# Patient Record
Sex: Male | Born: 1940 | Race: Black or African American | Hispanic: No | Marital: Married | Smoking: Never smoker
Health system: Southern US, Community
[De-identification: ages and names within clinical notes are randomized; demographics above are authoritative.]

## PROBLEM LIST (undated history)

## (undated) DIAGNOSIS — K5790 Diverticulosis of intestine, part unspecified, without perforation or abscess without bleeding: Secondary | ICD-10-CM

## (undated) DIAGNOSIS — K449 Diaphragmatic hernia without obstruction or gangrene: Secondary | ICD-10-CM

## (undated) DIAGNOSIS — E119 Type 2 diabetes mellitus without complications: Secondary | ICD-10-CM

## (undated) DIAGNOSIS — R413 Other amnesia: Secondary | ICD-10-CM

## (undated) DIAGNOSIS — B1920 Unspecified viral hepatitis C without hepatic coma: Secondary | ICD-10-CM

## (undated) DIAGNOSIS — C61 Malignant neoplasm of prostate: Secondary | ICD-10-CM

## (undated) DIAGNOSIS — I10 Essential (primary) hypertension: Secondary | ICD-10-CM

## (undated) HISTORY — PX: LAPAROSCOPIC CHOLECYSTECTOMY: SUR755

## (undated) HISTORY — PX: INSERTION PROSTATE RADIATION SEED: SUR718

---

## 2015-03-27 ENCOUNTER — Encounter (HOSPITAL_COMMUNITY): Payer: Self-pay | Admitting: *Deleted

## 2015-03-27 ENCOUNTER — Inpatient Hospital Stay (HOSPITAL_COMMUNITY)
Admission: EM | Admit: 2015-03-27 | Discharge: 2015-04-16 | DRG: 329 | Disposition: A | Discharge status: Skilled Nursing Facility | Payer: Medicare (Managed Care) | Attending: Internal Medicine | Admitting: Internal Medicine

## 2015-03-27 DIAGNOSIS — Z515 Encounter for palliative care: Secondary | ICD-10-CM | POA: Diagnosis not present

## 2015-03-27 DIAGNOSIS — Z833 Family history of diabetes mellitus: Secondary | ICD-10-CM | POA: Diagnosis not present

## 2015-03-27 DIAGNOSIS — K5791 Diverticulosis of intestine, part unspecified, without perforation or abscess with bleeding: Principal | ICD-10-CM | POA: Diagnosis present

## 2015-03-27 DIAGNOSIS — K921 Melena: Secondary | ICD-10-CM

## 2015-03-27 DIAGNOSIS — J690 Pneumonitis due to inhalation of food and vomit: Secondary | ICD-10-CM

## 2015-03-27 DIAGNOSIS — R1314 Dysphagia, pharyngoesophageal phase: Secondary | ICD-10-CM

## 2015-03-27 DIAGNOSIS — R579 Shock, unspecified: Secondary | ICD-10-CM | POA: Diagnosis present

## 2015-03-27 DIAGNOSIS — D62 Acute posthemorrhagic anemia: Secondary | ICD-10-CM | POA: Diagnosis present

## 2015-03-27 DIAGNOSIS — K5792 Diverticulitis of intestine, part unspecified, without perforation or abscess without bleeding: Secondary | ICD-10-CM | POA: Diagnosis present

## 2015-03-27 DIAGNOSIS — Z8042 Family history of malignant neoplasm of prostate: Secondary | ICD-10-CM

## 2015-03-27 DIAGNOSIS — R1312 Dysphagia, oropharyngeal phase: Secondary | ICD-10-CM | POA: Diagnosis present

## 2015-03-27 DIAGNOSIS — E872 Acidosis: Secondary | ICD-10-CM

## 2015-03-27 DIAGNOSIS — J9602 Acute respiratory failure with hypercapnia: Secondary | ICD-10-CM

## 2015-03-27 DIAGNOSIS — R0989 Other specified symptoms and signs involving the circulatory and respiratory systems: Secondary | ICD-10-CM

## 2015-03-27 DIAGNOSIS — K449 Diaphragmatic hernia without obstruction or gangrene: Secondary | ICD-10-CM | POA: Diagnosis present

## 2015-03-27 DIAGNOSIS — Z781 Physical restraint status: Secondary | ICD-10-CM | POA: Diagnosis not present

## 2015-03-27 DIAGNOSIS — K922 Gastrointestinal hemorrhage, unspecified: Secondary | ICD-10-CM | POA: Diagnosis present

## 2015-03-27 DIAGNOSIS — E876 Hypokalemia: Secondary | ICD-10-CM | POA: Diagnosis not present

## 2015-03-27 DIAGNOSIS — F0390 Unspecified dementia without behavioral disturbance: Secondary | ICD-10-CM | POA: Diagnosis present

## 2015-03-27 DIAGNOSIS — Z8546 Personal history of malignant neoplasm of prostate: Secondary | ICD-10-CM

## 2015-03-27 DIAGNOSIS — Z01818 Encounter for other preprocedural examination: Secondary | ICD-10-CM

## 2015-03-27 DIAGNOSIS — G9341 Metabolic encephalopathy: Secondary | ICD-10-CM | POA: Diagnosis present

## 2015-03-27 DIAGNOSIS — Z0189 Encounter for other specified special examinations: Secondary | ICD-10-CM

## 2015-03-27 DIAGNOSIS — Z978 Presence of other specified devices: Secondary | ICD-10-CM

## 2015-03-27 DIAGNOSIS — Z8249 Family history of ischemic heart disease and other diseases of the circulatory system: Secondary | ICD-10-CM

## 2015-03-27 DIAGNOSIS — F05 Delirium due to known physiological condition: Secondary | ICD-10-CM | POA: Diagnosis not present

## 2015-03-27 DIAGNOSIS — Z09 Encounter for follow-up examination after completed treatment for conditions other than malignant neoplasm: Secondary | ICD-10-CM

## 2015-03-27 DIAGNOSIS — R630 Anorexia: Secondary | ICD-10-CM | POA: Diagnosis present

## 2015-03-27 DIAGNOSIS — I469 Cardiac arrest, cause unspecified: Secondary | ICD-10-CM

## 2015-03-27 DIAGNOSIS — E1165 Type 2 diabetes mellitus with hyperglycemia: Secondary | ICD-10-CM | POA: Diagnosis present

## 2015-03-27 DIAGNOSIS — F0391 Unspecified dementia with behavioral disturbance: Secondary | ICD-10-CM | POA: Diagnosis not present

## 2015-03-27 DIAGNOSIS — E119 Type 2 diabetes mellitus without complications: Secondary | ICD-10-CM

## 2015-03-27 DIAGNOSIS — Z8719 Personal history of other diseases of the digestive system: Secondary | ICD-10-CM | POA: Diagnosis not present

## 2015-03-27 DIAGNOSIS — I10 Essential (primary) hypertension: Secondary | ICD-10-CM | POA: Diagnosis present

## 2015-03-27 DIAGNOSIS — Z4659 Encounter for fitting and adjustment of other gastrointestinal appliance and device: Secondary | ICD-10-CM

## 2015-03-27 DIAGNOSIS — G9340 Encephalopathy, unspecified: Secondary | ICD-10-CM | POA: Diagnosis not present

## 2015-03-27 DIAGNOSIS — Z9289 Personal history of other medical treatment: Secondary | ICD-10-CM

## 2015-03-27 DIAGNOSIS — J9601 Acute respiratory failure with hypoxia: Secondary | ICD-10-CM | POA: Diagnosis not present

## 2015-03-27 DIAGNOSIS — B1920 Unspecified viral hepatitis C without hepatic coma: Secondary | ICD-10-CM | POA: Diagnosis present

## 2015-03-27 DIAGNOSIS — N179 Acute kidney failure, unspecified: Secondary | ICD-10-CM | POA: Diagnosis present

## 2015-03-27 DIAGNOSIS — N170 Acute kidney failure with tubular necrosis: Secondary | ICD-10-CM | POA: Diagnosis not present

## 2015-03-27 DIAGNOSIS — Z452 Encounter for adjustment and management of vascular access device: Secondary | ICD-10-CM

## 2015-03-27 DIAGNOSIS — T17908A Unspecified foreign body in respiratory tract, part unspecified causing other injury, initial encounter: Secondary | ICD-10-CM

## 2015-03-27 HISTORY — DX: Unspecified viral hepatitis C without hepatic coma: B19.20

## 2015-03-27 HISTORY — DX: Malignant neoplasm of prostate: C61

## 2015-03-27 HISTORY — DX: Essential (primary) hypertension: I10

## 2015-03-27 HISTORY — DX: Type 2 diabetes mellitus without complications: E11.9

## 2015-03-27 HISTORY — DX: Diaphragmatic hernia without obstruction or gangrene: K44.9

## 2015-03-27 HISTORY — DX: Diverticulosis of intestine, part unspecified, without perforation or abscess without bleeding: K57.90

## 2015-03-27 HISTORY — DX: Other amnesia: R41.3

## 2015-03-27 LAB — CBC
HEMATOCRIT: 28.8 % — AB (ref 39.0–52.0)
Hemoglobin: 9.7 g/dL — ABNORMAL LOW (ref 13.0–17.0)
MCH: 31.7 pg (ref 26.0–34.0)
MCHC: 33.7 g/dL (ref 30.0–36.0)
MCV: 94.1 fL (ref 78.0–100.0)
PLATELETS: 205 10*3/uL (ref 150–400)
RBC: 3.06 MIL/uL — ABNORMAL LOW (ref 4.22–5.81)
RDW: 13.6 % (ref 11.5–15.5)
WBC: 11.3 10*3/uL — AB (ref 4.0–10.5)

## 2015-03-27 LAB — PROTIME-INR
INR: 1.13 (ref 0.00–1.49)
Prothrombin Time: 14.7 seconds (ref 11.6–15.2)

## 2015-03-27 LAB — COMPREHENSIVE METABOLIC PANEL
ALBUMIN: 3.4 g/dL — AB (ref 3.5–5.0)
ALT: 32 U/L (ref 17–63)
AST: 29 U/L (ref 15–41)
Alkaline Phosphatase: 51 U/L (ref 38–126)
Anion gap: 7 (ref 5–15)
BILIRUBIN TOTAL: 0.7 mg/dL (ref 0.3–1.2)
BUN: 31 mg/dL — AB (ref 6–20)
CO2: 22 mmol/L (ref 22–32)
CREATININE: 1.54 mg/dL — AB (ref 0.61–1.24)
Calcium: 9.3 mg/dL (ref 8.9–10.3)
Chloride: 110 mmol/L (ref 101–111)
GFR calc Af Amer: 50 mL/min — ABNORMAL LOW (ref >60)
GFR, EST NON AFRICAN AMERICAN: 43 mL/min — AB (ref >60)
GLUCOSE: 143 mg/dL — AB (ref 65–99)
Potassium: 4 mmol/L (ref 3.5–5.1)
Sodium: 139 mmol/L (ref 135–145)
TOTAL PROTEIN: 6.3 g/dL — AB (ref 6.5–8.1)

## 2015-03-27 LAB — CBG MONITORING, ED: GLUCOSE-CAPILLARY: 85 mg/dL (ref 65–99)

## 2015-03-27 LAB — APTT: APTT: 21 s — AB (ref 24–37)

## 2015-03-27 LAB — GLUCOSE, CAPILLARY
GLUCOSE-CAPILLARY: 98 mg/dL (ref 65–99)
GLUCOSE-CAPILLARY: 105 mg/dL — AB (ref 65–99)

## 2015-03-27 LAB — PREPARE RBC (CROSSMATCH)

## 2015-03-27 LAB — MRSA PCR SCREENING: MRSA BY PCR: NEGATIVE

## 2015-03-27 LAB — ABO/RH: ABO/RH(D): O POS

## 2015-03-27 MED ORDER — SODIUM CHLORIDE 0.9 % IV SOLN: Freq: Once | INTRAVENOUS | Status: DC

## 2015-03-27 MED ORDER — SODIUM CHLORIDE 0.9 % IV BOLUS (SEPSIS)
2000.0000 mL | Freq: Once | INTRAVENOUS | Status: AC
  Administered 2015-03-27: 2000 mL via INTRAVENOUS

## 2015-03-27 MED ORDER — INSULIN ASPART 100 UNIT/ML ~~LOC~~ SOLN
0.0000 [IU] | SUBCUTANEOUS | Status: DC
  Administered 2015-03-28 – 2015-03-29 (×2): 1 [IU] via SUBCUTANEOUS
  Administered 2015-03-29: 2 [IU] via SUBCUTANEOUS
  Administered 2015-03-29: 1 [IU] via SUBCUTANEOUS
  Administered 2015-03-29: 2 [IU] via SUBCUTANEOUS
  Administered 2015-03-30 (×4): 1 [IU] via SUBCUTANEOUS
  Administered 2015-03-30: 2 [IU] via SUBCUTANEOUS
  Administered 2015-03-31 – 2015-04-08 (×20): 1 [IU] via SUBCUTANEOUS
  Administered 2015-04-09: 2 [IU] via SUBCUTANEOUS
  Administered 2015-04-09 – 2015-04-16 (×11): 1 [IU] via SUBCUTANEOUS

## 2015-03-27 MED ORDER — SODIUM CHLORIDE 0.9 % IJ SOLN
3.0000 mL | Freq: Two times a day (BID) | INTRAMUSCULAR | Status: DC
  Administered 2015-03-28 – 2015-04-15 (×26): 3 mL via INTRAVENOUS

## 2015-03-27 MED ORDER — HALOPERIDOL LACTATE 5 MG/ML IJ SOLN
1.0000 mg | Freq: Once | INTRAMUSCULAR | Status: AC
  Administered 2015-03-27: 1 mg via INTRAVENOUS
  Filled 2015-03-27: qty 1

## 2015-03-27 MED ORDER — SODIUM CHLORIDE 0.9 % IV SOLN
INTRAVENOUS | Status: DC
  Administered 2015-03-27 – 2015-03-29 (×4): via INTRAVENOUS

## 2015-03-27 MED ORDER — SODIUM CHLORIDE 0.9 % IV SOLN
80.0000 mg | Freq: Once | INTRAVENOUS | Status: AC
  Administered 2015-03-27: 80 mg via INTRAVENOUS
  Filled 2015-03-27: qty 80

## 2015-03-27 MED ORDER — SODIUM CHLORIDE 0.9 % IV SOLN
8.0000 mg/h | INTRAVENOUS | Status: DC
  Administered 2015-03-27 – 2015-03-29 (×3): 8 mg/h via INTRAVENOUS
  Filled 2015-03-27 (×9): qty 80

## 2015-03-27 MED ORDER — ACETAMINOPHEN 325 MG PO TABS: 650.0000 mg | ORAL_TABLET | Freq: Four times a day (QID) | ORAL | Status: DC | PRN

## 2015-03-27 MED ORDER — ONDANSETRON HCL 4 MG/2ML IJ SOLN
4.0000 mg | Freq: Four times a day (QID) | INTRAMUSCULAR | Status: DC | PRN
  Filled 2015-03-27: qty 2

## 2015-03-27 MED ORDER — ACETAMINOPHEN 650 MG RE SUPP
650.0000 mg | Freq: Four times a day (QID) | RECTAL | Status: DC | PRN
  Administered 2015-04-01 – 2015-04-07 (×2): 650 mg via RECTAL
  Filled 2015-03-27 (×2): qty 1

## 2015-03-27 MED ORDER — ONDANSETRON HCL 4 MG PO TABS: 4.0000 mg | ORAL_TABLET | Freq: Four times a day (QID) | ORAL | Status: DC | PRN

## 2015-03-27 MED ORDER — INFLUENZA VAC SPLIT QUAD 0.5 ML IM SUSY
0.5000 mL | PREFILLED_SYRINGE | INTRAMUSCULAR | Status: DC
  Filled 2015-03-27 (×2): qty 0.5

## 2015-03-27 NOTE — ED Provider Notes (Signed)
CSN: [id number]     Arrival date & time 03/27/15  1328 History   First MD Initiated Contact with Patient 03/27/15 1343     Chief Complaint  Patient presents with  . Blood In Stools  . Weakness     (Consider location/radiation/quality/duration/timing/severity/associated sxs/prior Treatment) HPI Comments: Patient here with bloody stools 3 days. Stool is characterized as bright red blood and does have a history of diverticulitis. No abdominal pain. No vomiting noted. No fever or chills. Has had increasing weakness without syncope or near-syncope. No palpitations appreciated. No prior history of colonic polyps. Symptoms persistent and nothing makes them better or worse. No treatment use prior to arrival.  Patient is a 74 y.o. male presenting with weakness. The history is provided by the patient.  Weakness    Past Medical History  Diagnosis Date  . Memory loss   . Hypertension   . Diabetes mellitus without complication   . Diverticulitis   . Hepatitis C    Past Surgical History  Procedure Laterality Date  . Cholecystectomy     No family history on file. Social History  Substance Use Topics  . Smoking status: Never Smoker   . Smokeless tobacco: None  . Alcohol Use: Yes     Comment: occ    Review of Systems  Neurological: Positive for weakness.  All other systems reviewed and are negative.     Allergies  Review of patient's allergies indicates no known allergies.  Home Medications   Prior to Admission medications   Not on File   BP 98/62 mmHg  Pulse 120  Temp(Src) 97.8 F (36.6 C) (Oral)  Resp 16  Ht 5\' 11"  (1.803 m)  Wt 217 lb (98.431 kg)  BMI 30.28 kg/m2  SpO2 99% Physical Exam  Constitutional: He is oriented to person, place, and time. He appears well-developed and well-nourished.  Non-toxic appearance. No distress.  HENT:  Head: Normocephalic and atraumatic.  Eyes: Conjunctivae, EOM and lids are normal. Pupils are equal, round, and reactive to light.   Neck: Normal range of motion. Neck supple. No tracheal deviation present. No thyroid mass present.  Cardiovascular: Regular rhythm and normal heart sounds.  Tachycardia present.  Exam reveals no gallop.   No murmur heard. Pulmonary/Chest: Effort normal and breath sounds normal. No stridor. No respiratory distress. He has no decreased breath sounds. He has no wheezes. He has no rhonchi. He has no rales.  Abdominal: Soft. Normal appearance and bowel sounds are normal. He exhibits no distension. There is no tenderness. There is no rebound and no CVA tenderness.  Genitourinary:  Gross blood present  Musculoskeletal: Normal range of motion. He exhibits no edema or tenderness.  Neurological: He is alert and oriented to person, place, and time. He has normal strength. No cranial nerve deficit or sensory deficit. GCS eye subscore is 4. GCS verbal subscore is 5. GCS motor subscore is 6.  Skin: Skin is warm and dry. No abrasion and no rash noted.  Psychiatric: He has a normal mood and affect. His speech is normal and behavior is normal.  Nursing note and vitals reviewed.   ED Course  Procedures (including critical care time) Labs Review Labs Reviewed  COMPREHENSIVE METABOLIC PANEL  CBC  PROTIME-INR  APTT  POC OCCULT BLOOD, ED  TYPE AND SCREEN    Imaging Review No results found. I have personally reviewed and evaluated these images and lab results as part of my medical decision-making.   EKG Interpretation   Date/Time:  Thursday  March 27 2015 13:33:49 EDT Ventricular Rate:  126 PR Interval:  146 QRS Duration: 94 QT Interval:  318 QTC Calculation: 460 R Axis:   -26 Text Interpretation:  Sinus tachycardia with Premature atrial complexes  Incomplete right bundle branch block Possible Anterior infarct , age  undetermined Abnormal ECG Confirmed by Reiana Poteet  MD, Lizandra Zakrzewski ([id number]) on  03/27/2015 1:43:22 PM      MDM   Final diagnoses:  None   patient with hypotension here and given  fluid resuscitation. 2 L of packed red blood cells ordered. Patient mentating appropriately here. Will consult GI as well as hospitalist for admission to ICU   CRITICAL CARE Performed by: Leota Jacobsen Total critical care time: 50 Critical care time was exclusive of separately billable procedures and treating other patients. Critical care was necessary to treat or prevent imminent or life-threatening deterioration. Critical care was time spent personally by me on the following activities: development of treatment plan with patient and/or surrogate as well as nursing, discussions with consultants, evaluation of patient's response to treatment, examination of patient, obtaining history from patient or surrogate, ordering and performing treatments and interventions, ordering and review of laboratory studies, ordering and review of radiographic studies, pulse oximetry and re-evaluation of patient's condition.     Lacretia Leigh, MD 03/27/15 1455

## 2015-03-27 NOTE — ED Notes (Signed)
Pt CBG, 85. Nurse was notified.

## 2015-03-27 NOTE — H&P (Signed)
[facility] History and Physical                                                                                    Jesse [name], is a 74 y.o. male  MRN: [id number]   DOB - [birth year]  Admit Date - 03/27/2015  Outpatient Primary MD for the patient is No primary care provider on file.  Referring MD: Zenia Resides / ER  Consulting M.D: Fuller Plan / Gastroenterology  With History of -  Past Medical History  Diagnosis Date  . Memory loss   . Hypertension   . Diabetes mellitus without complication   . Diverticulitis   . Hepatitis C       Past Surgical History  Procedure Laterality Date  . Cholecystectomy      in for   Chief Complaint  Patient presents with  . Blood In Stools  . Weakness     HPI This is a 74 year old male patient visiting from [place] with this wife who has a past medical history of hypertension, diabetes mellitus not on insulin, dementia, diverticulitis, had a hernia and prostate cancer. He presented to the ER with reports of bloody stools for 3 days. He has not had any abdominal pain with these symptoms. He did report on Tuesday evening prior to the onset of the first bloody stool he was having epigastric discomfort and severe indigestion. He has not been taking any NSAIDs. His last colonoscopy was greater than 10 years ago and the wife reports he is due in 2016. Patient has not had any shortness of breath, chest pain or palpitations. The wife has noted the patient to be more weak and the patient has also reported weakness and fatigue. He has not had any constitutional symptoms such as fevers chills and has not had any sick contacts. No emesis.  When the patient presented to triage his blood pressure was 98/62 with a pulse rate of 120 and he was afebrile. His blood pressure did decrease to as low as 78/55 and he was subsequently given 2 L IV fluid by the ER physician with follow-up blood pressure up to 97/65. Initial hemoglobin 9.7 with baseline unknown but given  presentation with symptomatic anemia as evidenced by hypotension it is suspected this is not patient's baseline and 2 units of packed red cells up in order by the EDP. BUN was noted to be 31 with a creatinine of 1.5 for again baseline unknown, glucose 143, white count slightly elevated at 11,300, platelet counts normal 205,000, PT 14.7 and INR 1.13, PTT 21. EKG unremarkable with sinus tachycardia rate 126 bpm, QTC 460 ms, no ST segment changes or T-wave inversions that would be concerning for ischemia. On-call gastroenterology has been consulted by the EDP.   Review of Systems   In addition to the HPI above,  No Fever-chills, myalgias or other constitutional symptoms No Headache, changes with Vision or hearing, new weakness, tingling, numbness in any extremity, No problems swallowing food or Liquids, indigestion/reflux No Chest pain, Cough or Shortness of Breath, palpitations, orthopnea or DOE, No dysuria, hematuria or flank pain No new skin rashes, lesions, masses or bruises, No new joints pains-aches No  recent weight gain or loss No polyuria, polydypsia or polyphagia,  *A full 10 point Review of Systems was done, except as stated above, all other Review of Systems were negative.  Social History Social History  Substance Use Topics  . Smoking status: Never Smoker   . Smokeless tobacco: Not on file  . Alcohol Use: Yes     Comment: occ    Resides at: Private residence in [place] state  Lives with: Wife  Ambulatory status: Without assistive devices   Family History No family history on file. Limited family history obtained but wife reports there is a history of colon cancer within the patient's family   Prior to Admission medications   Medication Sig Start Date End Date Taking? Authorizing Provider  lisinopril (PRINIVIL,ZESTRIL) 20 MG tablet Take 20 mg by mouth daily.   Yes Historical Provider, MD  metFORMIN (GLUCOPHAGE) 500 MG tablet Take 500 mg by mouth 2 (two) times daily  with a meal.   Yes Historical Provider, MD    No Known Allergies  Physical Exam  Vitals  Blood pressure 97/65, pulse 97, temperature 97.8 F (36.6 C), temperature source Oral, resp. rate 13, height 5\' 11"  (1.803 m), weight 217 lb (98.431 kg), SpO2 100 %.   General:  In no acute distress, appears healthy and well nourished  Psych:  Normal affect, pleasantly confused and states "I do not know where I am at", wife has been assisting with redirection of the patient, no agitation  Neuro:   No focal neurological deficits, CN II through XII intact, Strength 5/5 all 4 extremities, Sensation intact all 4 extremities.  ENT:  Ears and Eyes appear Normal, Conjunctivae clear, PER. Moist oral mucosa without erythema or exudates.  Neck:  Supple, No lymphadenopathy appreciated  Respiratory:  Symmetrical chest wall movement, Good air movement bilaterally, CTAB. Room Air  Cardiac:  RRR, No Murmurs, no LE edema noted, no JVD, No carotid bruits, peripheral pulses palpable at 2+  Abdomen:  Positive bowel hypoactive bowel sounds, Soft, Non tender, distended somewhat more so than reported baseline according to wife,  No masses appreciated, no obvious hepatosplenomegaly  Skin:  No Cyanosis, Normal Skin Turgor, No Skin Rash or Bruise.  Extremities: Symmetrical without obvious trauma or injury,  no effusions.  Data Review  CBC  Recent Labs Lab 03/27/15 1400  WBC 11.3*  HGB 9.7*  HCT 28.8*  PLT 205  MCV 94.1  MCH 31.7  MCHC 33.7  RDW 13.6    Chemistries   Recent Labs Lab 03/27/15 1400  NA 139  K 4.0  CL 110  CO2 22  GLUCOSE 143*  BUN 31*  CREATININE 1.54*  CALCIUM 9.3  AST 29  ALT 32  ALKPHOS 51  BILITOT 0.7    estimated creatinine clearance is 51.8 mL/min (by C-G formula based on Cr of 1.54).  No results for input(s): TSH, T4TOTAL, T3FREE, THYROIDAB in the last 72 hours.  Invalid input(s): FREET3  Coagulation profile  Recent Labs Lab 03/27/15 1400  INR 1.13     No results for input(s): DDIMER in the last 72 hours.  Cardiac Enzymes No results for input(s): CKMB, TROPONINI, MYOGLOBIN in the last 168 hours.  Invalid input(s): CK  Invalid input(s): POCBNP  Urinalysis No results found for: COLORURINE, APPEARANCEUR, LABSPEC, [place], GLUCOSEU, HGBUR, BILIRUBINUR, KETONESUR, PROTEINUR, UROBILINOGEN, NITRITE, LEUKOCYTESUR  Imaging results:   No results found.   EKG: (Independently reviewed) sinus tachycardia, ventricular rate 126 bpm, QTC 460 ms, low R wave voltage V2  V3, R-R prime pattern suggestive of incomplete right bundle branch block, no ST segment or T-wave changes concerning for acute ischemia   Assessment & Plan  Principal Problem:   Acute GI bleeding/ Hiatal hernia/ History of diverticulitis -Admit to stepdown -GI consulted -Is having bright red blood per rectum at least 2 stools per day for the past 3 days but currently not actively bleeding-no abdominal pain -BUN elevated at 31 with baseline unknown therefore possibly could be experiencing upper GI bleed -As precaution will begin Protonix infusion in the event this is upper GI in etiology -NPO -IVFs  Active Problems:   Acute blood loss anemia -Hemoglobin 9.7 at presentation with baseline hemoglobin unknown -Patient clearly symptomatic upon arrival with hypotension; has received 2 L IV fluids in the ER and 2 units PRBCs pending -Repeat CBC after transfusion and again in a.m. -Have requested medical records from 2 hospitals in [place]    HTN  -Hold lisinopril in setting of symptomatic anemia as evidenced by hypotension as well as suspected acute renal failure    Diabetes mellitus type 2, controlled -On metformin home; currently on hold in setting of acute illness and suspected acute renal failure    Acute renal failure -Suspect prerenal etiology secondary to hypotension and find depletion from acute blood loss anemia -Hold lisinopril -Continue IV fluids -Repeat  electrolyte panel in a.m.    Dementia -Stable at baseline -Anticipate may worsen in setting of acute hospitalization and acute illness    History of prostate cancer -No reported issues with voiding    DVT Prophylaxis: SCDs  Family Communication:  Wife at bedside   Code Status:  Full code  Condition:  Guarded  Discharge disposition: Anticipate discharge back to home with wife pending resolution of GI bleed symptoms; if has recurrent bleeding and/or protracted hospital stay may require PT/OT before discharge disposition can be determined  Time spent in minutes : 60      ELLIS,ALLISON L. ANP on 03/27/2015 at 3:35 PM  Between 7am to 7pm - Pager - [phone]  After 7pm go to [email or web address] - password [email or web address]  And look for the night coverage person covering me after hours  [facility]  Addendum  I personally evaluated patient on 03/27/2015 and agree with above findings. Jesse [name] is a pleasant 74 year old gentleman with a history of hypertension and diabetes mellitus who presented to the emergency department with complaints of bright red blood per rectum over the past 3 days. He denies associated abdominal pain, nausea, vomiting, hematemesis. On presentation he was found to have a hemoglobin of 9.7 with hematocrit of 28.8. On exam he is mentating well, awake alert oriented, no acute distress. Had benign abdominal exam. Plan to monitor patient closely overnight in the step down unit. He has been typed and screened. GI consult has been placed.

## 2015-03-27 NOTE — ED Notes (Signed)
Attempted report 

## 2015-03-27 NOTE — ED Notes (Signed)
Wife states bloody stools since Tuesday.  States weakness since Wed, stating pt has difficulty standing for long periods.

## 2015-03-28 ENCOUNTER — Encounter (HOSPITAL_COMMUNITY): Payer: Self-pay | Admitting: Physician Assistant

## 2015-03-28 DIAGNOSIS — K922 Gastrointestinal hemorrhage, unspecified: Secondary | ICD-10-CM

## 2015-03-28 DIAGNOSIS — Z8719 Personal history of other diseases of the digestive system: Secondary | ICD-10-CM

## 2015-03-28 DIAGNOSIS — D62 Acute posthemorrhagic anemia: Secondary | ICD-10-CM

## 2015-03-28 LAB — CBC
HEMATOCRIT: 21.5 % — AB (ref 39.0–52.0)
HEMATOCRIT: 24.4 % — AB (ref 39.0–52.0)
HEMOGLOBIN: 7.1 g/dL — AB (ref 13.0–17.0)
Hemoglobin: 8.2 g/dL — ABNORMAL LOW (ref 13.0–17.0)
MCH: 30 pg (ref 26.0–34.0)
MCH: 30.4 pg (ref 26.0–34.0)
MCHC: 33 g/dL (ref 30.0–36.0)
MCHC: 33.6 g/dL (ref 30.0–36.0)
MCV: 90.7 fL (ref 78.0–100.0)
MCV: 90.4 fL (ref 78.0–100.0)
PLATELETS: 152 10*3/uL (ref 150–400)
Platelets: 174 10*3/uL (ref 150–400)
RBC: 2.37 MIL/uL — AB (ref 4.22–5.81)
RBC: 2.7 MIL/uL — AB (ref 4.22–5.81)
RDW: 16 % — ABNORMAL HIGH (ref 11.5–15.5)
RDW: 15.4 % (ref 11.5–15.5)
WBC: 10.7 10*3/uL — AB (ref 4.0–10.5)
WBC: 9.6 10*3/uL (ref 4.0–10.5)

## 2015-03-28 LAB — COMPREHENSIVE METABOLIC PANEL
ALBUMIN: 2.3 g/dL — AB (ref 3.5–5.0)
ALT: 21 U/L (ref 17–63)
AST: 22 U/L (ref 15–41)
Alkaline Phosphatase: 34 U/L — ABNORMAL LOW (ref 38–126)
Anion gap: 3 — ABNORMAL LOW (ref 5–15)
BILIRUBIN TOTAL: 0.9 mg/dL (ref 0.3–1.2)
BUN: 16 mg/dL (ref 6–20)
CHLORIDE: 117 mmol/L — AB (ref 101–111)
CO2: 21 mmol/L — ABNORMAL LOW (ref 22–32)
Calcium: 7.8 mg/dL — ABNORMAL LOW (ref 8.9–10.3)
Creatinine, Ser: 1.09 mg/dL (ref 0.61–1.24)
GFR calc Af Amer: >60 mL/min (ref >60)
GFR calc non Af Amer: >60 mL/min (ref >60)
GLUCOSE: 116 mg/dL — AB (ref 65–99)
POTASSIUM: 4.2 mmol/L (ref 3.5–5.1)
Sodium: 141 mmol/L (ref 135–145)
Total Protein: 4.4 g/dL — ABNORMAL LOW (ref 6.5–8.1)

## 2015-03-28 LAB — GLUCOSE, CAPILLARY
GLUCOSE-CAPILLARY: 103 mg/dL — AB (ref 65–99)
GLUCOSE-CAPILLARY: 116 mg/dL — AB (ref 65–99)
Glucose-Capillary: 120 mg/dL — ABNORMAL HIGH (ref 65–99)
Glucose-Capillary: 115 mg/dL — ABNORMAL HIGH (ref 65–99)
Glucose-Capillary: 107 mg/dL — ABNORMAL HIGH (ref 65–99)

## 2015-03-28 LAB — PREPARE RBC (CROSSMATCH)

## 2015-03-28 LAB — TSH: TSH: 0.43 u[IU]/mL (ref 0.350–4.500)

## 2015-03-28 LAB — HEMOGLOBIN AND HEMATOCRIT, BLOOD
HCT: 28.6 % — ABNORMAL LOW (ref 39.0–52.0)
Hemoglobin: 9.6 g/dL — ABNORMAL LOW (ref 13.0–17.0)

## 2015-03-28 LAB — HEMOGLOBIN A1C
Hgb A1c MFr Bld: 6.1 % — ABNORMAL HIGH (ref 4.8–5.6)
MEAN PLASMA GLUCOSE: 128 mg/dL

## 2015-03-28 MED ORDER — SODIUM CHLORIDE 0.9 % IV SOLN: Freq: Once | INTRAVENOUS | Status: AC

## 2015-03-28 MED ORDER — LORAZEPAM 2 MG/ML IJ SOLN
2.0000 mg | Freq: Once | INTRAMUSCULAR | Status: AC
  Administered 2015-03-28: 2 mg via INTRAVENOUS

## 2015-03-28 MED ORDER — POLYETHYLENE GLYCOL 3350 17 GM/SCOOP PO POWD
1.0000 | Freq: Once | ORAL | Status: DC
  Filled 2015-03-28: qty 255

## 2015-03-28 MED ORDER — HALOPERIDOL LACTATE 5 MG/ML IJ SOLN
5.0000 mg | Freq: Four times a day (QID) | INTRAMUSCULAR | Status: DC | PRN
  Administered 2015-03-28: 5 mg via INTRAVENOUS
  Administered 2015-04-03: 1 mg via INTRAVENOUS
  Filled 2015-03-28 (×3): qty 1

## 2015-03-28 MED ORDER — LORAZEPAM 2 MG/ML IJ SOLN
4.0000 mg | INTRAMUSCULAR | Status: DC | PRN
  Administered 2015-03-28: 4 mg via INTRAVENOUS
  Filled 2015-03-28: qty 2

## 2015-03-28 MED ORDER — LORAZEPAM 2 MG/ML IJ SOLN
INTRAMUSCULAR | Status: AC
  Administered 2015-03-28: 2 mg via INTRAVENOUS
  Filled 2015-03-28: qty 1

## 2015-03-28 MED ORDER — HALOPERIDOL LACTATE 5 MG/ML IJ SOLN
5.0000 mg | Freq: Once | INTRAMUSCULAR | Status: AC
  Administered 2015-03-28: 5 mg via INTRAVENOUS

## 2015-03-28 MED ORDER — SODIUM CHLORIDE 0.9 % IV SOLN
Freq: Once | INTRAVENOUS | Status: AC
  Administered 2015-03-28: 02:00:00 via INTRAVENOUS

## 2015-03-28 MED ORDER — LORAZEPAM 2 MG/ML IJ SOLN
1.0000 mg | INTRAMUSCULAR | Status: DC | PRN
  Filled 2015-03-28: qty 1

## 2015-03-28 MED ORDER — LORAZEPAM 2 MG/ML IJ SOLN
INTRAMUSCULAR | Status: AC
  Administered 2015-03-28: 1 mg via INTRAVENOUS
  Filled 2015-03-28: qty 1

## 2015-03-28 MED ORDER — HALOPERIDOL LACTATE 5 MG/ML IJ SOLN
5.0000 mg | Freq: Once | INTRAMUSCULAR | Status: AC
  Administered 2015-03-28: 5 mg via INTRAVENOUS
  Filled 2015-03-28: qty 1

## 2015-03-28 MED ORDER — HALOPERIDOL LACTATE 5 MG/ML IJ SOLN
INTRAMUSCULAR | Status: AC
  Administered 2015-03-28: 5 mg via INTRAVENOUS
  Filled 2015-03-28: qty 1

## 2015-03-28 MED ORDER — LORAZEPAM 2 MG/ML IJ SOLN
1.0000 mg | Freq: Once | INTRAMUSCULAR | Status: AC
  Administered 2015-03-28: 1 mg via INTRAVENOUS

## 2015-03-28 NOTE — Progress Notes (Signed)
Pt having large bloody stools at this time; pt lethargic; [facility] night coverage notified; BP 75/56, 1L emergency bolus initiated; orders received for 2 more units PRBCs; CBC drawn and sent; will administer blood and continue to monitor.

## 2015-03-28 NOTE — Progress Notes (Signed)
Patient became agitated and aggressive with wife and staff. Patient unable to be redirected, not listening to wife or staff. Became physically aggressive and reaching out for staff. Pulled at IV and caused tubing to become undone but IV site intact. Dr. Carles Collet contacted, new orders given will  Continue to monitor patient.

## 2015-03-28 NOTE — Progress Notes (Signed)
Patients wife had left for home. Few minutes later, patient became aggressive physically and verbally. Multiple nurses to bedside to assist with patient. Patient attempting to strike at staff. Patient was unable to be redirected. Dr. Carles Collet called. Dr. Carles Collet came to bedside to assess patient. Patient placed in restraints for safety. New orders given. Will continue to monitor patient.

## 2015-03-28 NOTE — Progress Notes (Signed)
Utilization review completed. Kaziah Krizek, RN, BSN. 

## 2015-03-28 NOTE — Progress Notes (Signed)
Patient became aggressive verbally and physically. Trying to climb out of bed, cussing at staff and wife. Patient unable to be calmed or redirected. Patient aggressive with staff, pulling at lines, security was contacted for assistance. Dr. Carles Collet was called, new orders received. Will continue to monitor patient.

## 2015-03-28 NOTE — Progress Notes (Signed)
PROGRESS NOTE  Jesse [name] TOI:[id number] DOB: 27-Dec-1940 DOA: 03/27/2015 PCP: No primary care provider on file.  Brief history 74 year old male with a history of hypertension, diabetes mellitus, dementia, diverticulitis, and prostate cancer presented to emergency department with 3 days of hematochezia. The patient and his wife are visiting from [place]. His wife states that the patient had a colonoscopy approximately 5 years ago and only showed diverticulosis. Patient was noted to be hypotensive with a blood pressure 78/55 in Emergency department which improved with fluid resuscitation. The patient received 2 units PRBCs. Patient denies any use of NSAIDs Assessment/Plan: Lower GI bleed -Suspect diverticular bleed -Consulted gastroenterology -Patient was started on Protonix drip in the emergency department--defer further management to gastroenterology -No bowel movement since admission Acute blood loss anemia -Already received 2 units PRBC -Transfuse 2 additional units -Drop in hemoglobin likely dilutional from IV fluids -Unclear what the patient's baseline hemoglobin is -INR 1.13, PTT 21 Renal failure -Unclear renal baseline, but suspect underlying CKD -Monitor electrolytes and renal function -Serum creatinine 1.54 on day of admission -Hold lisinopril Hypertension -Discontinue lisinopril in the setting of hypotension Diabetes mellitus type 2 -Discontinue metformin -NovoLog sliding scale Dementia -Anticipate periods of delirium -Haldol when necessary agitation  Family Communication:   Wife updated at beside Disposition Plan:   Home when medically stable     Procedures/Studies: No results found.      Subjective: Patient had episodes of agitation overnight. Presently denies any dizziness, chest discomfort, short of breath, nausea, vomiting, diarrhea, vomiting, dysuria, hematuria.  Objective: Filed Vitals:   03/28/15 0645 03/28/15 0700 03/28/15 0730 03/28/15  0742  BP: 83/64 90/57 109/59   Pulse: 99  95   Temp:    97.5 F (36.4 C)  TempSrc:    Oral  Resp: 15 20 18    Height:      Weight:      SpO2: 97%  98%     Intake/Output Summary (Last 24 hours) at 03/28/15 0849 Last data filed at 03/28/15 0700  Gross per 24 hour  Intake 4561.5 ml  Output    775 ml  Net 3786.5 ml   Weight change:  Exam:   General:  Pt is alert, follows commands appropriately, not in acute distress  HEENT: No icterus, No thrush, No neck mass, [place]/AT  Cardiovascular: RRR, S1/S2, no rubs, no gallops  Respiratory: CTA bilaterally, no wheezing, no crackles, no rhonchi  Abdomen: Soft/+BS, non tender, non distended, no guarding  Extremities: trace LE edema, No lymphangitis, No petechiae, No rashes, no synovitis  Data Reviewed: Basic Metabolic Panel:  Recent Labs Lab 03/27/15 1400  NA 139  K 4.0  CL 110  CO2 22  GLUCOSE 143*  BUN 31*  CREATININE 1.54*  CALCIUM 9.3   Liver Function Tests:  Recent Labs Lab 03/27/15 1400  AST 29  ALT 32  ALKPHOS 51  BILITOT 0.7  PROT 6.3*  ALBUMIN 3.4*   No results for input(s): LIPASE, AMYLASE in the last 168 hours. No results for input(s): AMMONIA in the last 168 hours. CBC:  Recent Labs Lab 03/27/15 1400 03/28/15 0157  WBC 11.3* 10.7*  HGB 9.7* 7.1*  HCT 28.8* 21.5*  MCV 94.1 90.7  PLT 205 174   Cardiac Enzymes: No results for input(s): CKTOTAL, CKMB, CKMBINDEX, TROPONINI in the last 168 hours. BNP: Invalid input(s): POCBNP CBG:  Recent Labs Lab 03/27/15 1639 03/27/15 1929 03/27/15 2304 03/28/15 0347 03/28/15 0748  GLUCAP 85  98 105* 120* 115*    Recent Results (from the past 240 hour(s))  MRSA PCR Screening     Status: None   Collection Time: 03/27/15  7:21 PM  Result Value Ref Range Status   MRSA by PCR NEGATIVE NEGATIVE Final    Comment:        The GeneXpert MRSA Assay (FDA approved for NASAL specimens only), is one component of a comprehensive MRSA colonization surveillance  program. It is not intended to diagnose MRSA infection nor to guide or monitor treatment for MRSA infections.      Scheduled Meds: . sodium chloride   Intravenous Once  . sodium chloride   Intravenous Once  . Influenza vac split quadrivalent PF  0.5 mL Intramuscular Tomorrow-1000  . insulin aspart  0-9 Units Subcutaneous 6 times per day  . sodium chloride  3 mL Intravenous Q12H   Continuous Infusions: . sodium chloride 125 mL/hr at 03/28/15 0700  . pantoprozole (PROTONIX) infusion 8 mg/hr (03/28/15 0700)     Nastashia Gallo, DO  [facility] Pager [phone]  If 7PM-7AM, please contact night-coverage [email or web address] Password [email or web address] 03/28/2015, 8:49 AM   LOS: 1 day

## 2015-03-28 NOTE — Consult Note (Signed)
[facility] Consult: 11:28 AM 03/28/2015  LOS: 1 day    Referring Provider: Dr Tat  Primary Care Physician:  Dr Elisha Headland [phone] in [place], [place] Primary Gastroenterologist:  Althia Forts.  Dr[place]urchio in [place].  [phone]  Wife: Aldo Sondgeroth [phone]  Reason for Consultation:  Painless hematochezia   HPI: Jesse [name] is a 74 y.o. male.  Patient and his wife has been residents of [place], [place]. However they're probably going to be moving to [place]. 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  HTN.  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 Hiatal hernia on EGD ~ 2011.  Diverticulosis on colonoscopy ~ 2011. GI bleed with hematochezia in 2015, wife not sure what the source was; did not undergo repeat endoscopic evaluation then.   Pt has had painless hematochezia daily with AM BM beginning 8/29. Wife observed weakness starting 8/31 but had to cajole pt to come to hospital.  Pt denies dizziness, chest pain, abd pain.  No nausea.  Generally great appetite but anorexic starting yesterday.   Hgb 9.7 to 7.1 to 8.2 since arrival.  Received large volume of IVF for hypotension, AKI. Has also received 4 units of packed red blood cells with final hemoglobin pending. Coags normal.  LFTs normal.   Agitated c/w sundowning overnight. Treated with Haldol.    Past Medical History  Diagnosis Date  . Memory loss   . Hypertension   . Diabetes mellitus without complication   . Diverticulosis     Of colon  . Hepatitis C     Virus has never been treated. No history of cirrhosis.  . Prostate cancer ~ 1990s.    Treated with radiation seed implant.  . Hiatal hernia     Does not require meds for reflux symptoms.    Past Surgical History  Procedure Laterality Date  . Laparoscopic  cholecystectomy  ~ 2003  . Insertion prostate radiation seed  1990s    Prior to Admission medications   Medication Sig Start Date End Date Taking? Authorizing Provider  lisinopril (PRINIVIL,ZESTRIL) 20 MG tablet Take 20 mg by mouth daily.   Yes Historical Provider, MD  metFORMIN (GLUCOPHAGE) 500 MG tablet Take 500 mg by mouth 2 (two) times daily with a meal.   Yes Historical Provider, MD    Scheduled Meds: . sodium chloride   Intravenous Once  . sodium chloride   Intravenous Once  . Influenza vac split quadrivalent PF  0.5 mL Intramuscular Tomorrow-1000  . insulin aspart  0-9 Units Subcutaneous 6 times per day  . sodium chloride  3 mL Intravenous Q12H   Infusions: . sodium chloride 75 mL/hr at 03/28/15 0943  . pantoprozole (PROTONIX) infusion 8 mg/hr (03/28/15 0700)   PRN Meds: acetaminophen **OR** acetaminophen, haloperidol lactate, ondansetron **OR** ondansetron (ZOFRAN) IV   Allergies as of 03/27/2015  . (No Known Allergies)    No family history on file.  Social History   Social History  . Marital Status: Married    Spouse Name: N/A  .  Number of Children: N/A  . Years of Education: N/A   Occupational History  . Not on file.   Social History Main Topics  . Smoking status: Never Smoker   . Smokeless tobacco: Not on file  . Alcohol Use: Yes     Comment: occ  . Drug Use: No  . Sexual Activity: Not on file   Other Topics Concern  . 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    REVIEW OF SYSTEMS: Constitutional:  Generally patient has no limits to activity. He doesn't require a walker or cane to ambulate.  In the last year or so the patient dropped 30 pounds as a result of his wife putting him on a healthier diet. ENT:  No nose bleeds.  No dental pain. No trouble chewing Pulm:  No shortness of breath, no cough. CV:  No palpitations, no LE edema. No chest pain. No previous history of cardiac  issues. GU:  No hematuria, no frequency GI:  No dysphagia. Per HPI Heme:  Per HPI   Transfusions:  None ever. Neuro:  No headaches, no peripheral tingling or numbness.  Has no troubles with insomnia at home. Wears glasses for distance/myopia. Derm:  No itching, no rash or sores.  Endocrine:  No sweats or chills.  No polyuria or dysuria Immunization:  Did not inquire. Travel:  None beyond local counties in last few months.    PHYSICAL EXAM: Vital signs in last 24 hours: Filed Vitals:   03/28/15 1029  BP: 129/75  Pulse: 70  Temp: 98.1 F (36.7 C)  Resp: 20   Wt Readings from Last 3 Encounters:  03/27/15 222 lb 14.2 oz (101.1 kg)    General: Pleasant, relaxed, does not look ill. Head:  No asymmetry, no swelling, no signs of head trauma.  Eyes:  No scleral icterus, no conjunctival pallor. Ears:  Not hard of hearing.  Nose:  No congestion or discharge. Mouth:  Clear, moist. Several missing teeth. Dentition in poor repair. Neck:  No JVD, no TMG. Lungs:  Clear bilaterally. Unlabored breathing. No cough. Heart: RRR. No MRG. S1/S2 audible. Abdomen:  Soft, NT, ND.  No HSM or masses.  No bruits, no hernias..   Rectal: Small hemorrhoidal tags which do not look like they have been bleeding. No masses. Deep red blood on exam glove.   Musc/Skeltl: No joint swelling, contracture deformities or redness. Extremities:  No lower extremity edema.  Neurologic:  Patient is oriented to herself only.  Fully awake and alert. Follows commands. He moves all 4 limbs. No tremor. Skin:  No telangiectasia, no rashes. No sores. Tattoos:  Several on the arms and neck. Nodes:  No cervical or inguinal adenopathy.   Psych:  Presently calm, cooperative  Intake/Output from previous day: 09/01 0701 - 09/02 0700 In: 4561.5 [I.V.:3254.5; Blood:1307] Out: 775 [Urine:775] Intake/Output this shift: Total I/O In: 30 [Blood:30] Out: -   LAB RESULTS:  Recent Labs  03/27/15 1400 03/28/15 0157  03/28/15 0810  WBC 11.3* 10.7* 9.6  HGB 9.7* 7.1* 8.2*  HCT 28.8* 21.5* 24.4*  PLT 205 174 152   BMET Lab Results  Component Value Date   NA 141 03/28/2015   NA 139 03/27/2015   K 4.2 03/28/2015   K 4.0 03/27/2015   CL 117* 03/28/2015   CL 110 03/27/2015   CO2 21* 03/28/2015   CO2 22 03/27/2015   GLUCOSE 116* 03/28/2015  GLUCOSE 143* 03/27/2015   BUN 16 03/28/2015   BUN 31* 03/27/2015   CREATININE 1.09 03/28/2015   CREATININE 1.54* 03/27/2015   CALCIUM 7.8* 03/28/2015   CALCIUM 9.3 03/27/2015   LFT  Recent Labs  03/27/15 1400 03/28/15 0810  PROT 6.3* 4.4*  ALBUMIN 3.4* 2.3*  AST 29 22  ALT 32 21  ALKPHOS 51 34*  BILITOT 0.7 0.9   PT/INR Lab Results  Component Value Date   INR 1.13 03/27/2015    Drugs of Abuse  No results found for: LABOPIA, COCAINSCRNUR, LABBENZ, AMPHETMU, THCU, LABBARB   RADIOLOGY STUDIES: No results found.  ENDOSCOPIC STUDIES: Per HPI  IMPRESSION:   *  Painless hematochezia. Suspect diverticular disease is sores, has had diverticulosis noted on previous colonoscopy.  *  Anemia. Suspect this is due to acute blood loss. However we have no baseline anemia for comparison.  MCV of 90 is well within the normal limits. S/p PRBCs x 4   *  History hepatitis C positive. This has never been treated. No signs of cirrhosis (coags normal, platelets normal, LFTs normal, no obvious hepatic encephalopathy.)  *  Dementia.  *  History of prostate cancer treated with radiation seed implantation remotely. 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  Suspect patient will need to have colonoscopy. Prep may be a challenge given his sundowning/agitation. The best strategy for prep might be for him to go through a rapid prep early in the morning, while his wife is here and to undergo colonoscopy later in the afternoon. She doesn't think he's cannot tolerate NG tube for prep.   Azucena Freed  03/28/2015, 11:28 AM Pager:  [phone]      Attending physician's note   I have taken a history, examined the patient and reviewed the chart. I agree with the Advanced Practitioner's note, impression and recommendations. Acute painless hematochezia initially with hypotension and now hemodynamically stable. Suspected diverticular bleed or possibly a brisk UGI bleed. Agree with transfusion to keep Hb > 8. Received 4 units PRBC thus far. Colonoscopy and possible EGD tomorrow.   Ladene Artist, MD [facility] [phone] pager Mon-Fri 8a-5p [phone] weekends, holidays and 5p-8a or per [facility]

## 2015-03-29 ENCOUNTER — Inpatient Hospital Stay (HOSPITAL_COMMUNITY): Payer: Medicare (Managed Care)

## 2015-03-29 ENCOUNTER — Encounter (HOSPITAL_COMMUNITY)
Admission: EM | Disposition: A | Discharge status: Skilled Nursing Facility | Payer: Self-pay | Source: Home / Self Care | Attending: Internal Medicine

## 2015-03-29 ENCOUNTER — Encounter (HOSPITAL_COMMUNITY): Payer: Self-pay | Admitting: Internal Medicine

## 2015-03-29 ENCOUNTER — Inpatient Hospital Stay (HOSPITAL_COMMUNITY): Payer: Medicare (Managed Care) | Admitting: Anesthesiology

## 2015-03-29 DIAGNOSIS — J9601 Acute respiratory failure with hypoxia: Secondary | ICD-10-CM | POA: Insufficient documentation

## 2015-03-29 DIAGNOSIS — K921 Melena: Secondary | ICD-10-CM | POA: Insufficient documentation

## 2015-03-29 DIAGNOSIS — R579 Shock, unspecified: Secondary | ICD-10-CM

## 2015-03-29 DIAGNOSIS — Z8719 Personal history of other diseases of the digestive system: Secondary | ICD-10-CM | POA: Insufficient documentation

## 2015-03-29 DIAGNOSIS — K922 Gastrointestinal hemorrhage, unspecified: Secondary | ICD-10-CM

## 2015-03-29 HISTORY — PX: COLECTOMY: SHX59

## 2015-03-29 HISTORY — PX: CYSTOSCOPY: SHX5120

## 2015-03-29 HISTORY — PX: ESOPHAGOGASTRODUODENOSCOPY: SHX5428

## 2015-03-29 LAB — GLUCOSE, CAPILLARY
GLUCOSE-CAPILLARY: 129 mg/dL — AB (ref 65–99)
Glucose-Capillary: 149 mg/dL — ABNORMAL HIGH (ref 65–99)
Glucose-Capillary: 179 mg/dL — ABNORMAL HIGH (ref 65–99)
Glucose-Capillary: 183 mg/dL — ABNORMAL HIGH (ref 65–99)
Glucose-Capillary: 157 mg/dL — ABNORMAL HIGH (ref 65–99)
Glucose-Capillary: 142 mg/dL — ABNORMAL HIGH (ref 65–99)

## 2015-03-29 LAB — DIC (DISSEMINATED INTRAVASCULAR COAGULATION) PANEL
D DIMER QUANT: 0.51 ug{FEU}/mL — AB (ref 0.00–0.48)
PLATELETS: 158 10*3/uL (ref 150–400)

## 2015-03-29 LAB — POCT I-STAT 7, (LYTES, BLD GAS, ICA,H+H)
ACID-BASE DEFICIT: 6 mmol/L — AB (ref 0.0–2.0)
ACID-BASE DEFICIT: 6 mmol/L — AB (ref 0.0–2.0)
Bicarbonate: 21.1 mEq/L (ref 20.0–24.0)
Bicarbonate: 20.6 mEq/L (ref 20.0–24.0)
CALCIUM ION: 1.01 mmol/L — AB (ref 1.13–1.30)
Calcium, Ion: 1.12 mmol/L — ABNORMAL LOW (ref 1.13–1.30)
HCT: 20 % — ABNORMAL LOW (ref 39.0–52.0)
HCT: 24 % — ABNORMAL LOW (ref 39.0–52.0)
HEMOGLOBIN: 8.2 g/dL — AB (ref 13.0–17.0)
Hemoglobin: 6.8 g/dL — CL (ref 13.0–17.0)
O2 SAT: 100 %
O2 SAT: 100 %
PCO2 ART: 47.3 mmHg — AB (ref 35.0–45.0)
PCO2 ART: 44 mmHg (ref 35.0–45.0)
PH ART: 7.272 — AB (ref 7.350–7.450)
PO2 ART: 414 mmHg — AB (ref 80.0–100.0)
PO2 ART: 459 mmHg — AB (ref 80.0–100.0)
POTASSIUM: 4.2 mmol/L (ref 3.5–5.1)
Potassium: 4 mmol/L (ref 3.5–5.1)
Sodium: 144 mmol/L (ref 135–145)
Sodium: 146 mmol/L — ABNORMAL HIGH (ref 135–145)
TCO2: 23 mmol/L (ref 0–100)
TCO2: 22 mmol/L (ref 0–100)
pH, Arterial: 7.254 — ABNORMAL LOW (ref 7.350–7.450)

## 2015-03-29 LAB — BASIC METABOLIC PANEL
ANION GAP: 4 — AB (ref 5–15)
Anion gap: 7 (ref 5–15)
BUN: 16 mg/dL (ref 6–20)
BUN: 12 mg/dL (ref 6–20)
CALCIUM: 7.2 mg/dL — AB (ref 8.9–10.3)
CHLORIDE: 118 mmol/L — AB (ref 101–111)
CHLORIDE: 117 mmol/L — AB (ref 101–111)
CO2: 20 mmol/L — AB (ref 22–32)
CO2: 17 mmol/L — AB (ref 22–32)
CREATININE: 1.18 mg/dL (ref 0.61–1.24)
Calcium: 7.7 mg/dL — ABNORMAL LOW (ref 8.9–10.3)
Creatinine, Ser: 1.11 mg/dL (ref 0.61–1.24)
GFR calc Af Amer: >60 mL/min (ref >60)
GFR calc non Af Amer: >60 mL/min (ref >60)
GFR calc non Af Amer: 60 mL/min — ABNORMAL LOW (ref >60)
GLUCOSE: 156 mg/dL — AB (ref 65–99)
Glucose, Bld: 203 mg/dL — ABNORMAL HIGH (ref 65–99)
POTASSIUM: 4 mmol/L (ref 3.5–5.1)
Potassium: 3.6 mmol/L (ref 3.5–5.1)
Sodium: 142 mmol/L (ref 135–145)
Sodium: 141 mmol/L (ref 135–145)

## 2015-03-29 LAB — POCT I-STAT 3, ART BLOOD GAS (G3+)
ACID-BASE DEFICIT: 9 mmol/L — AB (ref 0.0–2.0)
ACID-BASE DEFICIT: 6 mmol/L — AB (ref 0.0–2.0)
Acid-base deficit: 5 mmol/L — ABNORMAL HIGH (ref 0.0–2.0)
BICARBONATE: 17.6 meq/L — AB (ref 20.0–24.0)
BICARBONATE: 20.5 meq/L (ref 20.0–24.0)
Bicarbonate: 18.1 mEq/L — ABNORMAL LOW (ref 20.0–24.0)
O2 SAT: 100 %
O2 Saturation: 100 %
O2 Saturation: 14 %
PCO2 ART: 27.8 mmHg — AB (ref 35.0–45.0)
PH ART: 7.42 (ref 7.350–7.450)
PO2 ART: 15 mmHg — AB (ref 80.0–100.0)
PO2 ART: 245 mmHg — AB (ref 80.0–100.0)
Patient temperature: 99.4
Patient temperature: 97.6
TCO2: 19 mmol/L (ref 0–100)
TCO2: 22 mmol/L (ref 0–100)
TCO2: 19 mmol/L (ref 0–100)
pCO2 arterial: 36.4 mmHg (ref 35.0–45.0)
pCO2 arterial: 44.4 mmHg (ref 35.0–45.0)
pH, Arterial: 7.207 — ABNORMAL LOW (ref 7.350–7.450)
pH, Arterial: 7.358 (ref 7.350–7.450)
pO2, Arterial: 522 mmHg — ABNORMAL HIGH (ref 80.0–100.0)

## 2015-03-29 LAB — PREPARE RBC (CROSSMATCH)

## 2015-03-29 LAB — TSH: TSH: 0.851 u[IU]/mL (ref 0.350–4.500)

## 2015-03-29 LAB — CBC
HEMATOCRIT: 26.4 % — AB (ref 39.0–52.0)
HEMOGLOBIN: 9.4 g/dL — AB (ref 13.0–17.0)
MCH: 30.2 pg (ref 26.0–34.0)
MCHC: 35.6 g/dL (ref 30.0–36.0)
MCV: 84.9 fL (ref 78.0–100.0)
Platelets: 126 10*3/uL — ABNORMAL LOW (ref 150–400)
RBC: 3.11 MIL/uL — ABNORMAL LOW (ref 4.22–5.81)
RDW: 14.5 % (ref 11.5–15.5)
WBC: 13.1 10*3/uL — ABNORMAL HIGH (ref 4.0–10.5)

## 2015-03-29 LAB — LACTIC ACID, PLASMA: Lactic Acid, Venous: 3.1 mmol/L (ref 0.5–2.0)

## 2015-03-29 LAB — HEMOGLOBIN AND HEMATOCRIT, BLOOD
HEMATOCRIT: 17.5 % — AB (ref 39.0–52.0)
HEMATOCRIT: 20 % — AB (ref 39.0–52.0)
HEMOGLOBIN: 6 g/dL — AB (ref 13.0–17.0)
Hemoglobin: 7 g/dL — ABNORMAL LOW (ref 13.0–17.0)

## 2015-03-29 LAB — DIC (DISSEMINATED INTRAVASCULAR COAGULATION)PANEL
Fibrinogen: 168 mg/dL — ABNORMAL LOW (ref 204–475)
INR: 1.43 (ref 0.00–1.49)
Prothrombin Time: 17.5 seconds — ABNORMAL HIGH (ref 11.6–15.2)
Smear Review: NONE SEEN
aPTT: 27 seconds (ref 24–37)

## 2015-03-29 LAB — APTT: APTT: 32 s (ref 24–37)

## 2015-03-29 LAB — AMMONIA: AMMONIA: 23 umol/L (ref 9–35)

## 2015-03-29 LAB — VITAMIN B12: VITAMIN B 12: 551 pg/mL (ref 180–914)

## 2015-03-29 LAB — PROTIME-INR
INR: 1.54 — AB (ref 0.00–1.49)
Prothrombin Time: 18.6 seconds — ABNORMAL HIGH (ref 11.6–15.2)

## 2015-03-29 SURGERY — COLECTOMY, TOTAL
Anesthesia: General | Site: Urethra

## 2015-03-29 SURGERY — EGD (ESOPHAGOGASTRODUODENOSCOPY)
Anesthesia: Moderate Sedation

## 2015-03-29 SURGERY — COLONOSCOPY
Anesthesia: Moderate Sedation

## 2015-03-29 MED ORDER — FENTANYL CITRATE (PF) 100 MCG/2ML IJ SOLN
INTRAMUSCULAR | Status: AC
  Filled 2015-03-29: qty 2

## 2015-03-29 MED ORDER — MIDAZOLAM HCL 2 MG/2ML IJ SOLN
2.0000 mg | INTRAMUSCULAR | Status: DC | PRN
Start: 2015-03-29 — End: 2015-03-30
  Administered 2015-03-30: 2 mg via INTRAVENOUS
  Filled 2015-03-29: qty 2

## 2015-03-29 MED ORDER — 0.9 % SODIUM CHLORIDE (POUR BTL) OPTIME
TOPICAL | Status: DC | PRN
  Administered 2015-03-29: 3000 mL

## 2015-03-29 MED ORDER — NOREPINEPHRINE BITARTRATE 1 MG/ML IV SOLN
2.0000 ug/min | INTRAVENOUS | Status: DC
  Filled 2015-03-29 (×2): qty 4

## 2015-03-29 MED ORDER — FENTANYL CITRATE (PF) 100 MCG/2ML IJ SOLN
INTRAMUSCULAR | Status: DC | PRN
  Administered 2015-03-29: 50 ug via INTRAVENOUS
  Administered 2015-03-29 (×2): 100 ug via INTRAVENOUS

## 2015-03-29 MED ORDER — MIDAZOLAM HCL 5 MG/ML IJ SOLN
INTRAMUSCULAR | Status: AC
  Filled 2015-03-29: qty 2

## 2015-03-29 MED ORDER — FENTANYL CITRATE (PF) 100 MCG/2ML IJ SOLN
INTRAMUSCULAR | Status: AC
  Administered 2015-03-29: 100 ug
  Filled 2015-03-29: qty 2

## 2015-03-29 MED ORDER — DOPAMINE-DEXTROSE 3.2-5 MG/ML-% IV SOLN
INTRAVENOUS | Status: AC
  Administered 2015-03-29: 5 ug/kg/min
  Filled 2015-03-29: qty 250

## 2015-03-29 MED ORDER — FENTANYL CITRATE (PF) 100 MCG/2ML IJ SOLN
50.0000 ug | INTRAMUSCULAR | Status: DC | PRN
  Administered 2015-03-29 (×2): 100 ug via INTRAVENOUS
  Filled 2015-03-29: qty 2

## 2015-03-29 MED ORDER — MIDAZOLAM HCL 2 MG/2ML IJ SOLN
INTRAMUSCULAR | Status: AC
  Filled 2015-03-29: qty 2

## 2015-03-29 MED ORDER — ANTISEPTIC ORAL RINSE SOLUTION (CORINZ)
7.0000 mL | Freq: Four times a day (QID) | OROMUCOSAL | Status: DC
  Administered 2015-03-29 – 2015-04-04 (×22): 7 mL via OROMUCOSAL

## 2015-03-29 MED ORDER — ROCURONIUM BROMIDE 50 MG/5ML IV SOLN
1.0000 mg/kg | Freq: Once | INTRAVENOUS | Status: DC
  Filled 2015-03-29: qty 10.11

## 2015-03-29 MED ORDER — MIDAZOLAM HCL 2 MG/2ML IJ SOLN
4.0000 mg | Freq: Once | INTRAMUSCULAR | Status: AC
  Administered 2015-03-29: 4 mg via INTRAVENOUS

## 2015-03-29 MED ORDER — ROCURONIUM BROMIDE 100 MG/10ML IV SOLN
INTRAVENOUS | Status: DC | PRN
  Administered 2015-03-29 (×2): 50 mg via INTRAVENOUS

## 2015-03-29 MED ORDER — SODIUM CHLORIDE 0.9 % IV BOLUS (SEPSIS)
250.0000 mL | Freq: Once | INTRAVENOUS | Status: AC
  Administered 2015-03-29: 250 mL via INTRAVENOUS

## 2015-03-29 MED ORDER — PHENYLEPHRINE HCL 10 MG/ML IJ SOLN
0.0000 ug/min | INTRAMUSCULAR | Status: DC
  Administered 2015-03-30: 220 ug/min via INTRAVENOUS
  Administered 2015-03-30: 20 ug/min via INTRAVENOUS
  Administered 2015-03-31: 80 ug/min via INTRAVENOUS
  Administered 2015-03-31: 180 ug/min via INTRAVENOUS
  Administered 2015-04-01: 5 ug/min via INTRAVENOUS
  Filled 2015-03-29 (×8): qty 4

## 2015-03-29 MED ORDER — MIDAZOLAM HCL 2 MG/2ML IJ SOLN: 4.0000 mg | Freq: Once | INTRAMUSCULAR | Status: DC

## 2015-03-29 MED ORDER — ETOMIDATE 2 MG/ML IV SOLN
0.3000 mg/kg | Freq: Once | INTRAVENOUS | Status: DC
  Filled 2015-03-29: qty 15.17

## 2015-03-29 MED ORDER — PANTOPRAZOLE SODIUM 40 MG IV SOLR
40.0000 mg | INTRAVENOUS | Status: DC
  Administered 2015-03-30 – 2015-04-09 (×11): 40 mg via INTRAVENOUS
  Filled 2015-03-29 (×12): qty 40

## 2015-03-29 MED ORDER — MIDAZOLAM HCL 2 MG/2ML IJ SOLN
INTRAMUSCULAR | Status: AC
  Filled 2015-03-29: qty 4

## 2015-03-29 MED ORDER — DEXTROSE 5 % IV SOLN
0.0000 ug/min | INTRAVENOUS | Status: DC
  Administered 2015-03-29: 150 ug/min via INTRAVENOUS
  Filled 2015-03-29: qty 1

## 2015-03-29 MED ORDER — CALCIUM GLUCONATE 10 % IV SOLN
2.0000 g | Freq: Once | INTRAVENOUS | Status: AC
  Administered 2015-03-29: 2 g via INTRAVENOUS
  Filled 2015-03-29: qty 20

## 2015-03-29 MED ORDER — FENTANYL CITRATE (PF) 250 MCG/5ML IJ SOLN
INTRAMUSCULAR | Status: AC
  Filled 2015-03-29: qty 5

## 2015-03-29 MED ORDER — FENTANYL CITRATE (PF) 2500 MCG/50ML IJ SOLN
50.0000 ug/h | INTRAMUSCULAR | Status: DC
  Administered 2015-03-29: 50 ug/h via INTRAVENOUS
  Filled 2015-03-29: qty 50

## 2015-03-29 MED ORDER — DIPHENHYDRAMINE HCL 50 MG/ML IJ SOLN
INTRAMUSCULAR | Status: AC
  Filled 2015-03-29: qty 1

## 2015-03-29 MED ORDER — DEXTROSE 5 % IV SOLN
2.0000 g | INTRAVENOUS | Status: AC
  Administered 2015-03-29: 2 g via INTRAVENOUS
  Filled 2015-03-29: qty 2

## 2015-03-29 MED ORDER — DEXTROSE IN LACTATED RINGERS 5 % IV SOLN
INTRAVENOUS | Status: DC
  Administered 2015-03-29 – 2015-03-30 (×2): via INTRAVENOUS

## 2015-03-29 MED ORDER — SODIUM CHLORIDE 0.9 % IV BOLUS (SEPSIS)
500.0000 mL | Freq: Once | INTRAVENOUS | Status: AC
  Administered 2015-03-29: 500 mL via INTRAVENOUS

## 2015-03-29 MED ORDER — LACTATED RINGERS IV SOLN
INTRAVENOUS | Status: DC | PRN
  Administered 2015-03-29: 10:00:00 via INTRAVENOUS

## 2015-03-29 MED ORDER — "THROMBI-PAD 3""X3"" EX PADS"
1.0000 | MEDICATED_PAD | Freq: Once | CUTANEOUS | Status: DC
  Filled 2015-03-29: qty 1

## 2015-03-29 MED ORDER — MIDAZOLAM HCL 5 MG/5ML IJ SOLN
INTRAMUSCULAR | Status: DC | PRN
  Administered 2015-03-29: 2 mg via INTRAVENOUS

## 2015-03-29 MED ORDER — MIDAZOLAM HCL 2 MG/2ML IJ SOLN
4.0000 mg | Freq: Once | INTRAMUSCULAR | Status: AC
  Administered 2015-03-29: 4 mg via INTRAVENOUS
  Filled 2015-03-29: qty 4

## 2015-03-29 MED ORDER — FENTANYL CITRATE (PF) 100 MCG/2ML IJ SOLN
50.0000 ug | INTRAMUSCULAR | Status: DC | PRN
  Administered 2015-03-29 (×2): 50 ug via INTRAVENOUS
  Filled 2015-03-29 (×2): qty 2

## 2015-03-29 MED ORDER — CHLORHEXIDINE GLUCONATE 0.12% ORAL RINSE (MEDLINE KIT)
15.0000 mL | Freq: Two times a day (BID) | OROMUCOSAL | Status: DC
  Administered 2015-03-29 – 2015-04-02 (×8): 15 mL via OROMUCOSAL

## 2015-03-29 MED ORDER — ENOXAPARIN SODIUM 40 MG/0.4ML ~~LOC~~ SOLN
40.0000 mg | SUBCUTANEOUS | Status: DC
  Administered 2015-03-30 – 2015-04-09 (×11): 40 mg via SUBCUTANEOUS
  Filled 2015-03-29 (×14): qty 0.4

## 2015-03-29 MED ORDER — MIDAZOLAM HCL 2 MG/2ML IJ SOLN
INTRAMUSCULAR | Status: AC
  Administered 2015-03-29: 4 mg via INTRAVENOUS
  Filled 2015-03-29: qty 4

## 2015-03-29 MED ORDER — FENTANYL CITRATE (PF) 100 MCG/2ML IJ SOLN
50.0000 ug | INTRAMUSCULAR | Status: DC | PRN
  Filled 2015-03-29: qty 2

## 2015-03-29 MED ORDER — SODIUM CHLORIDE 0.9 % IV BOLUS (SEPSIS)
1000.0000 mL | Freq: Once | INTRAVENOUS | Status: AC
  Administered 2015-03-29: 1000 mL via INTRAVENOUS

## 2015-03-29 MED ORDER — MIDAZOLAM HCL 2 MG/2ML IJ SOLN
1.0000 mg | INTRAMUSCULAR | Status: DC | PRN
  Administered 2015-03-29: 1 mg via INTRAVENOUS

## 2015-03-29 MED ORDER — SODIUM CHLORIDE 0.9 % IV SOLN
Freq: Once | INTRAVENOUS | Status: AC
  Administered 2015-03-29: 01:00:00 via INTRAVENOUS

## 2015-03-29 MED ORDER — SODIUM BICARBONATE 8.4 % IV SOLN
INTRAVENOUS | Status: AC
  Administered 2015-03-29: 50 meq
  Filled 2015-03-29: qty 50

## 2015-03-29 MED ORDER — SODIUM CHLORIDE 0.9 % IV SOLN: Freq: Once | INTRAVENOUS | Status: DC

## 2015-03-29 MED ORDER — PHENYLEPHRINE HCL 10 MG/ML IJ SOLN
INTRAMUSCULAR | Status: DC | PRN
  Administered 2015-03-29 (×2): 80 ug via INTRAVENOUS
  Administered 2015-03-29: 120 ug via INTRAVENOUS
  Administered 2015-03-29: 80 ug via INTRAVENOUS
  Administered 2015-03-29: 120 ug via INTRAVENOUS

## 2015-03-29 MED ORDER — NOREPINEPHRINE BITARTRATE 1 MG/ML IV SOLN
4000.0000 ug | INTRAVENOUS | Status: DC | PRN
  Administered 2015-03-29: 15 ug/min via INTRAVENOUS
  Administered 2015-03-29: 10 ug/min via INTRAVENOUS

## 2015-03-29 MED ORDER — SODIUM CHLORIDE 0.9 % IV SOLN
Freq: Once | INTRAVENOUS | Status: AC
  Administered 2015-03-29: 2000 mL via INTRAVENOUS

## 2015-03-29 MED ORDER — ALBUMIN HUMAN 5 % IV SOLN
INTRAVENOUS | Status: DC | PRN
  Administered 2015-03-29 (×2): via INTRAVENOUS

## 2015-03-29 MED ORDER — MIDAZOLAM HCL 2 MG/2ML IJ SOLN: 1.0000 mg | INTRAMUSCULAR | Status: DC | PRN

## 2015-03-29 SURGICAL SUPPLY — 57 items
BLADE SURG ROTATE 9660 (MISCELLANEOUS) ×4 IMPLANT
CANISTER SUCTION 2500CC (MISCELLANEOUS) ×4 IMPLANT
CATH COUDE FOLEY 2W 5CC 16FR (CATHETERS) ×8 IMPLANT
CATH COUDE FOLEY 2W 5CC 18FR (CATHETERS) ×4 IMPLANT
CHLORAPREP W/TINT 26ML (MISCELLANEOUS) ×4 IMPLANT
COVER MAYO STAND STRL (DRAPES) IMPLANT
DRAPE LAPAROSCOPIC ABDOMINAL (DRAPES) ×4 IMPLANT
DRAPE PROXIMA HALF (DRAPES) IMPLANT
DRAPE UTILITY XL STRL (DRAPES) IMPLANT
DRAPE WARM FLUID 44X44 (DRAPE) ×4 IMPLANT
DRSG OPSITE POSTOP 4X10 (GAUZE/BANDAGES/DRESSINGS) ×4 IMPLANT
DRSG OPSITE POSTOP 4X8 (GAUZE/BANDAGES/DRESSINGS) ×4 IMPLANT
ELECT BLADE 6.5 EXT (BLADE) ×4 IMPLANT
ELECT CAUTERY BLADE 6.4 (BLADE) ×4 IMPLANT
ELECT REM PT RETURN 9FT ADLT (ELECTROSURGICAL) ×4
ELECTRODE REM PT RTRN 9FT ADLT (ELECTROSURGICAL) ×2 IMPLANT
GAUZE SPONGE 4X4 16PLY XRAY LF (GAUZE/BANDAGES/DRESSINGS) ×4 IMPLANT
GLOVE BIO SURGEON STRL SZ7.5 (GLOVE) ×8 IMPLANT
GLOVE BIOGEL PI IND STRL 8 (GLOVE) ×4 IMPLANT
GLOVE BIOGEL PI INDICATOR 8 (GLOVE) ×4
GOWN STRL REUS W/ TWL LRG LVL3 (GOWN DISPOSABLE) ×2 IMPLANT
GOWN STRL REUS W/ TWL XL LVL3 (GOWN DISPOSABLE) ×4 IMPLANT
GOWN STRL REUS W/TWL LRG LVL3 (GOWN DISPOSABLE) ×2
GOWN STRL REUS W/TWL XL LVL3 (GOWN DISPOSABLE) ×4
KIT BASIN OR (CUSTOM PROCEDURE TRAY) ×4 IMPLANT
KIT OSTOMY DRAINABLE 2.75 STR (WOUND CARE) ×4 IMPLANT
KIT ROOM TURNOVER OR (KITS) ×4 IMPLANT
LEGGING LITHOTOMY PAIR STRL (DRAPES) IMPLANT
LIGASURE IMPACT 36 18CM CVD LR (INSTRUMENTS) IMPLANT
NS IRRIG 1000ML POUR BTL (IV SOLUTION) ×24 IMPLANT
PACK GENERAL/GYN (CUSTOM PROCEDURE TRAY) ×4 IMPLANT
PAD ARMBOARD 7.5X6 YLW CONV (MISCELLANEOUS) ×8 IMPLANT
PENCIL BUTTON HOLSTER BLD 10FT (ELECTRODE) IMPLANT
SPONGE LAP 18X18 X RAY DECT (DISPOSABLE) ×16 IMPLANT
STAPLER CUT CVD 40MM BLUE (STAPLE) ×4 IMPLANT
STAPLER CUT RELOAD BLUE (STAPLE) ×4 IMPLANT
STAPLER VISISTAT 35W (STAPLE) IMPLANT
SUCTION POOLE TIP (SUCTIONS) ×4 IMPLANT
SURGILUBE 2OZ TUBE FLIPTOP (MISCELLANEOUS) IMPLANT
SUT PDS AB 1 TP1 96 (SUTURE) ×8 IMPLANT
SUT PROLENE 2 0 CT2 30 (SUTURE) ×4 IMPLANT
SUT PROLENE 2 0 KS (SUTURE) IMPLANT
SUT SILK 2 0 SH CR/8 (SUTURE) ×4 IMPLANT
SUT SILK 2 0 TIES 10X30 (SUTURE) ×4 IMPLANT
SUT SILK 3 0 SH CR/8 (SUTURE) ×4 IMPLANT
SUT SILK 3 0 TIES 10X30 (SUTURE) ×4 IMPLANT
SUT VIC AB 3-0 SH 18 (SUTURE) ×4 IMPLANT
SYR BULB IRRIGATION 50ML (SYRINGE) IMPLANT
SYRINGE IRR TOOMEY STRL 70CC (SYRINGE) ×12 IMPLANT
TOWEL OR 17X26 10 PK STRL BLUE (TOWEL DISPOSABLE) ×12 IMPLANT
TRAY FOLEY CATH 16FRSI W/METER (SET/KITS/TRAYS/PACK) ×4 IMPLANT
TRAY FOLEY W/METER SILVER 14FR (SET/KITS/TRAYS/PACK) ×4 IMPLANT
TRAY PROCTOSCOPIC FIBER OPTIC (SET/KITS/TRAYS/PACK) IMPLANT
TUBE CONNECTING 12'X1/4 (SUCTIONS)
TUBE CONNECTING 12X1/4 (SUCTIONS) IMPLANT
TUBING CYSTO DISP (UROLOGICAL SUPPLIES) ×4 IMPLANT
YANKAUER SUCT BULB TIP NO VENT (SUCTIONS) ×4 IMPLANT

## 2015-03-29 NOTE — OR Nursing (Signed)
Foley catheter removed by Dr. Rosendo Gros due to foley not draining. Frank blood coming from urethra. Dr. McDermide consulted and is on his way to [facility] operating room. Patient still under anesthesia. Dr McDermide said he would be here in 10 minutes.

## 2015-03-29 NOTE — Progress Notes (Signed)
Patient transferred down from 3S to 81M with GI bleed. Patient intubated to protect airway without difficulty. Bilateral breath sounds and ETCO2 are positive. CXR is on the way. Patient getting a procedure done at this time. Airway is patent. RT will continue to monitor.

## 2015-03-29 NOTE — Progress Notes (Signed)
PULMONARY / CRITICAL CARE MEDICINE Transfer note   Name: Jesse [name] MRN: [id number] DOB: [birth year]    ADMISSION DATE:  03/27/2015  CHIEF COMPLAINT:  Acute lower GIB/hemmorhagic shock  INITIAL PRESENTATION:  3 days melena   SIGNIFICANT EVENTS: Progressive hypotension/shock   HISTORY OF PRESENT ILLNESS:   Jesse [name] is a 74 y.o. male. Patient and his wife has been residents of [place], [place]. However they're probably going to be moving to [place]. 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 HTN. 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 Hiatal hernia on EGD ~ 2011. Diverticulosis on colonoscopy ~ 2011. GI bleed with hematochezia in 2015, wife not sure what the source was; did not undergo repeat endoscopic evaluation then.   Pt has had painless hematochezia daily with AM BM beginning 8/29. Wife observed weakness starting 8/31 but had to cajole pt to come to hospital. Pt denies dizziness, chest pain, abd pain. No nausea. Generally great appetite but anorexic starting yesterday.   Tonight:  PCCM called to bedside, patient was unresponsive and in shock.  He was immediately transferred to the ICU for Hemmorhagic shock, and my own history/data gathering from his wife was deferred to stabilize him.   PAST MEDICAL HISTORY :   has a past medical history of Memory loss; Hypertension; Diabetes mellitus without complication; Diverticulosis; Hepatitis C; Prostate cancer (~ 1990s.); and Hiatal hernia.  has past surgical history that includes Laparoscopic cholecystectomy (~ 2003) and Insertion prostate radiation seed (1990s). Prior to Admission medications   Medication Sig Start Date End Date Taking? Authorizing Provider  lisinopril (PRINIVIL,ZESTRIL) 20 MG tablet Take 20 mg by mouth daily.   Yes Historical Provider, MD  metFORMIN (GLUCOPHAGE) 500 MG tablet Take 500 mg by mouth 2 (two) times daily with a meal.   Yes Historical Provider, MD   No Known  Allergies  FAMILY HISTORY:  has no family status information on file.  SOCIAL HISTORY:  reports that he has never smoked. He does not have any smokeless tobacco history on file. He reports that he drinks alcohol. He reports that he does not use illicit drugs.  REVIEW OF SYSTEMS:  Unable to obtain  SUBJECTIVE:   VITAL SIGNS: Temp:  [97.9 F (36.6 C)-99.4 F (37.4 C)] 99.4 F (37.4 C) (09/03 0742) Pulse Rate:  [32-186] 111 (09/03 0835) Resp:  [17-31] 25 (09/03 0835) BP: (66-150)/(41-95) 128/87 mmHg (09/03 0835) SpO2:  [94 %-100 %] 99 % (09/03 0835) FiO2 (%):  [100 %] 100 % (09/03 0600) HEMODYNAMICS:   VENTILATOR SETTINGS: Vent Mode:  [-] PRVC FiO2 (%):  [100 %] 100 % Set Rate:  [25 bmp] 25 bmp Vt Set:  [637 mL] 620 mL PEEP:  [5 cmH20] 5 cmH20 Plateau Pressure:  [17 cmH20] 17 cmH20 INTAKE / OUTPUT:  Intake/Output Summary (Last 24 hours) at 03/29/15 0846 Last data filed at 03/29/15 0710  Gross per 24 hour  Intake 4739.45 ml  Output    950 ml  Net 3789.45 ml    PHYSICAL EXAMINATION: General:  Obtunded Neuro:  Obtunded, GCS6 HEENT:  Dry MM Cardiovascular:  Tachycardic, no m/r/g Lungs:  CTA b/l no w/r/r Abdomen:  Soft, tender, non distended, normal bowel sound Skin: Cool to touch  LABS:  CBC  Recent Labs Lab 03/27/15 1400 03/28/15 0157 03/28/15 0810 03/28/15 1420 03/29/15 0007 03/29/15 0756  WBC 11.3* 10.7* 9.6  --   --   --   HGB 9.7* 7.1* 8.2* 9.6* 7.0* 6.0*  HCT  28.8* 21.5* 24.4* 28.6* 20.0* 17.5*  PLT 205 174 152  --   --  158   Coag's  Recent Labs Lab 03/27/15 1400 03/29/15 0756  APTT 21* PENDING  INR 1.13 PENDING   BMET  Recent Labs Lab 03/27/15 1400 03/28/15 0810 03/29/15 0757  NA 139 141 142  K 4.0 4.2 4.0  CL 110 117* 118*  CO2 22 21* 20*  BUN 31* 16 16  CREATININE 1.54* 1.09 1.11  GLUCOSE 143* 116* 203*   Electrolytes  Recent Labs Lab 03/27/15 1400 03/28/15 0810 03/29/15 0757  CALCIUM 9.3 7.8* 7.7*   Sepsis  Markers No results for input(s): LATICACIDVEN, PROCALCITON, O2SATVEN in the last 168 hours. ABG  Recent Labs Lab 03/29/15 0742 03/29/15 0828  PHART 7.207* 7.358  PCO2ART 44.4 36.4  PO2ART 15.0* 522.0*   Liver Enzymes  Recent Labs Lab 03/27/15 1400 03/28/15 0810  AST 29 22  ALT 32 21  ALKPHOS 51 34*  BILITOT 0.7 0.9  ALBUMIN 3.4* 2.3*   Cardiac Enzymes No results for input(s): TROPONINI, PROBNP in the last 168 hours. Glucose  Recent Labs Lab 03/28/15 1136 03/28/15 1540 03/28/15 2009 03/28/15 2333 03/29/15 0306 03/29/15 0746  GLUCAP 107* 103* 116* 129* 149* 179*    Imaging Dg Chest Port 1 View  03/29/2015   CLINICAL DATA:  Cardiac arrest  EXAM: PORTABLE CHEST - 1 VIEW  COMPARISON:  03/29/2015 at 7:  17  FINDINGS: Endotracheal tube and LEFT central venous line unchanged. Pad artifact overlies central chest. Normal cardiac silhouette. There is LEFT basilar atelectasis. No pneumothorax.  IMPRESSION: Mild increased LEFT basilar atelectasis.  Endotracheal tube unchanged.  No pneumothorax.   Electronically Signed   By: Suzy Bouchard M.D.   On: 03/29/2015 08:41   Dg Chest Port 1 View  03/29/2015   CLINICAL DATA:  intubation central line placement.  EXAM: PORTABLE CHEST - 1 VIEW  COMPARISON:  None.  FINDINGS: Endotracheal tube 7 cm from carina. Normal cardiac silhouette. No effusion, infiltrate, pneumothorax. RIGHT IJ sheath.  IMPRESSION: Endotracheal tube 7 cm from carina.   Electronically Signed   By: Suzy Bouchard M.D.   On: 03/29/2015 07:46   Dg Chest Port 1 View  03/29/2015   CLINICAL DATA:  74 year old male status post central line placement.  EXAM: PORTABLE CHEST - 1 VIEW  COMPARISON:  Chest x-ray a 03/29/2015.  FINDINGS: New left internal jugular central venous catheter with tip terminating at the superior cavoatrial junction. Right IJ central venous Cordis with tip in the internal jugular vein. An endotracheal tube is in place with tip 7.1 cm above the carina. Lung  volumes are low. Bibasilar opacities (left greater than right), most compatible subsegmental atelectasis. No consolidative airspace disease. No pneumothorax. No pleural effusions. No evidence of pulmonary edema. Heart size and mediastinal contours are within normal limits.  IMPRESSION: 1. Support apparatus, as above. New left internal jugular central venous catheter tip terminates at the superior cavoatrial junction. 2. No pneumothorax or other complicating features. 3. Low lung volumes with bibasilar subsegmental atelectasis.   Electronically Signed   By: Vinnie Langton M.D.   On: 03/29/2015 07:33     ASSESSMENT / PLAN:  74 yo male with acute lower GIB likely diverticular, with hemmorhagic shock.  PULMONARY A: Intubated for airway protection due to shock and low GCS.  P:    - Full vent support.  - PPI gtt d/c since EGD is negative and source of bleeding is lower GI.  - ABG  noted and vent adjusted.  - Sedation as ordered, PRN.  CARDIOVASCULAR A: Acute hemmorhagic shock.  S/p 8 UPRBC since admission 3 in last 24hrs. P:   - Fight IJ cortis/introducer placed 9/3>>>  - Left IJ central line placed 9/3>>>  - R radial a-line 9/3>>>  - Massive transfusion protocol activated  - 2g Calcium gluconate IV given  - Levophed and fentanyl PRN.  GASTROINTESTINAL A:  Acute lower GIB suspect diverticular, currently no e/o Upper source.  No history  P:    - GI consulted and EGD is negative.  - Surgery consulted will take to the OR for a total colectomy.  - Further treatment as above.  HEMATOLOGIC A:  Hemmorhagic shock.  Etiology as above. P:   - Tx as above  - Given total of 8 units of pRBC, 4 of FFP and 3 of platelets.  FAMILY  - Updates:  No family bedside or reachable, will sign emergency consent for a total colectomy given that patient's bleeding is uncontrollable and that he almost arrested prior to aggressive resuscitation but continues to hemorrhage from a lower GI bleed.  Unable to  reach wife.  The patient is critically ill with multiple organ systems failure and requires high complexity decision making for assessment and support, frequent evaluation and titration of therapies, application of advanced monitoring technologies and extensive interpretation of multiple databases.   Critical Care Time devoted to patient care services described in this note is  60  Minutes. This time reflects time of care of this signee Dr [name]. This critical care time does not reflect procedure time, or teaching time or supervisory time of PA/NP/Med student/Med Resident etc but could involve care discussion time.  Rush Farmer, M.D. [facility]: [facility] Pulmonary/Critical Care Medicine. Pager: [phone]. After hours pager: [phone].  03/29/2015, 8:46 AM

## 2015-03-29 NOTE — Progress Notes (Signed)
CRITICAL VALUE ALERT  Critical value received:  Hg 7.0, down from 9.6  Date of notification:  03/29/2015  Time of notification:  0038  Critical value read back:Yes.    Nurse who received alert:  Dorene Grebe, RN  MD notified (1st page):  Walden Field, NP  Time of first page:  [phone]  MD notified (2nd page):  Time of second page:  Responding MD:  Walden Field, NP  Time MD responded:  [phone], orders to transfuse 2 units PRBCs. Will administer and continue to monitor.  Sherlie Ban, RN

## 2015-03-29 NOTE — Op Note (Signed)
Preoperative diagnosis: Hematuria and inability to advance catheter Postoperative diagnosis: Hematuria and inability to advance catheter Surgery: Cystoscopy and insertion of catheter and bladder irrigation Surgeon: Dr. Bjorn Loser  The patient is above diagnoses after being called the operating room by general surgery. The patient apparently has had radiation seeds. He had a Foley catheter placed initially. Urine was clear. There was some issues with some bleeding near the end of the case. The catheter could not be advanced after deflating the balloon and I was called  Patient was asleep on the operating table in the supine position. Male genitalia was normal  The cystoscope was utilized. The penile urethra distally was normal. 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 I did cystoscope easily through the prosthetic urethra in the bladder. The bladder was rather cloudy mixed with urine a little bit of blood.  44 French coud catheter was easily inserted without a guidewire. It was irrigated multiple times with only a few clots. Urine was pinkish color. There was more than a liter in his bladder.  The patient be followed as per protocol he catheter in for approximately one week

## 2015-03-29 NOTE — Procedures (Signed)
CENTRAL VENOUS CATHETER INSERTION   Indication: Shock Consent: Emergent Time out: yes Appropriate position: yes Hand washing: yes Patient Sterilized and Draped: yes Location: Left IJ # of Attempts: 1 Ultrasound Guidance: yes Wire Confirmed with [place]: yes Insertion depth: 20 cm All Ports Draw and flush: yes CXR:   Pneumothorax: no  Line position appropriate: yes Line sutured in place: yes EBL: 5 cc Complications: no Patient Tolerated Procedure Well: yes     Meribeth Mattes, DO., MS [facility]

## 2015-03-29 NOTE — Progress Notes (Signed)
Pt returned from OR. Pts vitals are stable. Pt has midline incision with scant drainage. Ileostomy dry and intact. Will continue to monitor pt.

## 2015-03-29 NOTE — Procedures (Signed)
CENTRAL VENOUS (Cortis) CATHETER INSERTION   Indication: Shock Consent: Emergent Time out: yes Appropriate position: yes Hand washing: yes Patient Sterilized and Draped: yes Location: Right IJ # of Attempts: 1 Ultrasound Guidance: yes Wire Confirmed with [place]: yes Insertion depth: 12 cm All Ports Draw and flush: yes CXR:   Pneumothorax: no  Line position appropriate: yes Line sutured in place: yes EBL: 248 cc Complications: yes: given hypocalcemia and emergent line, patient bleed significantly.  Pressure was held, Calcium gluconate administered. And all bleeding stopped by the end of the procedure. Patient Tolerated Procedure Well: yes     Meribeth Mattes, DO., MS [facility]

## 2015-03-29 NOTE — Progress Notes (Addendum)
RT attempted to get an ABG on pt at 07:42. The sample was venous in error, POCT notified.  RT called to pt's room to prepare an A-line pressure set-up and assist MD with A-line insertion. Once the A-line was complete, a new ABG sample was obtained from the line.

## 2015-03-29 NOTE — Progress Notes (Signed)
Progress Note   Subjective  Pt became hemodynamically unstable overnight Tachy, hypotensive, disoriented and agitated CCM consulted, transferred to medical ICU, now intubated, sedated Has received total of 8 u pRBC since admission (3 overnight) Since ICU transfer also 2 pk plts, 2 u FFP, and calcium gluconate   Objective   Vital signs in last 24 hours: Temp:  [97.5 F (36.4 C)-98.8 F (37.1 C)] 97.9 F (36.6 C) (09/03 0505) Pulse Rate:  [32-186] 186 (09/03 0600) Resp:  [17-31] 25 (09/03 0600) BP: (66-144)/(41-95) 84/61 mmHg (09/03 0600) SpO2:  [94 %-100 %] 100 % (09/03 0600) FiO2 (%):  [100 %] 100 % (09/03 0600) Last BM Date: 03/28/15 Gen: intubated, sedated HEENT: ett in place, central line in left IJ CV: tachy, regular Pulm: course b/l Abd: soft, NT/ND, +BS throughout Ext: no c/c/e, scds in place Neuro: sedated   Intake/Output from previous day: 09/02 0701 - 09/03 0700 In: 4246.5 [P.O.:480; I.V.:2140; Blood:1126.5; IV Piggyback:500] Out: 950 [Urine:950] Intake/Output this shift: Total I/O In: 493 [Blood:493] Out: -   Lab Results:  Recent Labs  03/27/15 1400 03/28/15 0157 03/28/15 0810 03/28/15 1420 03/29/15 0007  WBC 11.3* 10.7* 9.6  --   --   HGB 9.7* 7.1* 8.2* 9.6* 7.0*  HCT 28.8* 21.5* 24.4* 28.6* 20.0*  PLT 205 174 152  --   --    BMET  Recent Labs  03/27/15 1400 03/28/15 0810  NA 139 141  K 4.0 4.2  CL 110 117*  CO2 22 21*  GLUCOSE 143* 116*  BUN 31* 16  CREATININE 1.54* 1.09  CALCIUM 9.3 7.8*   LFT  Recent Labs  03/28/15 0810  PROT 4.4*  ALBUMIN 2.3*  AST 22  ALT 21  ALKPHOS 34*  BILITOT 0.9   PT/INR  Recent Labs  03/27/15 1400  LABPROT 14.7  INR 1.13    Studies/Results: Dg Chest Port 1 View  03/29/2015   CLINICAL DATA:  74 year old male status post central line placement.  EXAM: PORTABLE CHEST - 1 VIEW  COMPARISON:  Chest x-ray a 03/29/2015.  FINDINGS: New left internal jugular central venous catheter with tip  terminating at the superior cavoatrial junction. Right IJ central venous Cordis with tip in the internal jugular vein. An endotracheal tube is in place with tip 7.1 cm above the carina. Lung volumes are low. Bibasilar opacities (left greater than right), most compatible subsegmental atelectasis. No consolidative airspace disease. No pneumothorax. No pleural effusions. No evidence of pulmonary edema. Heart size and mediastinal contours are within normal limits.  IMPRESSION: 1. Support apparatus, as above. New left internal jugular central venous catheter tip terminates at the superior cavoatrial junction. 2. No pneumothorax or other complicating features. 3. Low lung volumes with bibasilar subsegmental atelectasis.   Electronically Signed   By: Vinnie Langton M.D.   On: 03/29/2015 07:33     Assessment / Plan:   74 yo with hx of dementia, dm2, HTN, hiatal hernia, and diverticulosis admitted with painless hematochezia now with hemorrhagic shock  1. GI bleed/shock -- since ICU transfer he has been stabilized and resuscitated.  Transiently on vasopressors, but these are now off.  Most likely diverticular bleed, but plan EGD bedside now to exclude brisk upper GI bleeding.  On ppi gtt.  Unable to prep for colonoscopy as planned yesterday, so attempt at colonoscopy to identify source of LGI bleeding would be very very low.  If EGD neg, anticipate tagged RBC study   Principal Problem:   Acute GI  bleeding Active Problems:   HTN (hypertension)   Diabetes mellitus type 2, controlled   Dementia   Hiatal hernia   History of diverticulitis   History of prostate cancer   Acute blood loss anemia   Acute renal failure   GI bleeding     LOS: 2 days   Moraima Burd M  03/29/2015, 7:39 AM

## 2015-03-29 NOTE — Op Note (Signed)
03/29/2015  12:00 PM  PATIENT:  Jesse [name]  74 y.o. male  PRE-OPERATIVE DIAGNOSIS:  Lower GI bleed  POST-OPERATIVE DIAGNOSIS:  Lower GI bleed  PROCEDURE:  Procedure(s): EMERGENT EXPLORATORY LAP WITH TOTAL COLECTOMY (N/A)  SURGEON:  Surgeon(s) and Role:  * Ralene Ok, MD - Primary  * Johnathan Hausen, MD - Assisting  ANESTHESIA:   general  EBL: 150cc from case  Total I/O In: 7592 [I.V.:2200; YQMVH:8469; IV Piggyback:500] Out: 215 [Urine:65; Blood:150]  BLOOD ADMINISTERED: per Anesthesia record  DRAINS: none   LOCAL MEDICATIONS USED:  NONE  SPECIMEN:  Source of Specimen:  Total colectomy  DISPOSITION OF SPECIMEN:  PATHOLOGY  COUNTS:  YES  TOURNIQUET:  * No tourniquets in log *  DICTATION: .Dragon Dictation  After the patient was emergently consented he was taken back to the operating room and placed in the supine position with bilateral SCDs in place. The patient underwent general endotracheal intubation. A Foley catheter was placed. The patient was then prepped and draped in the usual sterile fashion. A timeout was called all facts verified. The patient was given preoperative antibiotics.  A midline incision was made with a #10 blade. Electrocautery was used to maintain hemostasis dissection was taken down to the midline fascia. This was incised. The fascia was elevated and the peritoneum was bluntly perforated. At this time the peritoneum in the patch were then extended to the length of the skin incision. At this time evaluating the abdominal cavity there appeared to be some omental adhesions to the left lower quadrant, previous incision site. These were taken down with electrocautery to maintain hemostasis.  At this time I proceeded to mobilize the cecum and right colon medially by incising the white line of Toldt. The mesenteric attachments were also dissected away from the retroperitoneum. The duodenum was identified and protected. I proceeded to come around the  hepatic flexure and mobilizing the omentum off of the colon. This was done with blunt and cautery dissection. This was taken across the transverse colon towards the splenic flexure.  At this time I proceeded to turn my attention to the splenic flexure. This was done by proceeding proximally and distally to the splenic flexure. The omentum was taken down from the splenic flexure with electrocautery. I proceeded to dissect away the left colon from the retroperitoneum by again incising the white line of Toldt. The mesenteric attachments were bluntly dissected away from Gerota's capsule. I proceeded towards the sigmoid colon and incised the peritoneum. An area of the rectum where the tinea coli came together was chosen as the area of transection of the colon. The mesentery of the sigmoid rectum was incised with electrocautery and a blue load curvilinear stapler was then fired to transect the distal portion of the sigmoid. The rectal stump was tagged with 2-0 Prolene 2.  At this time having mobilized the entire colon the mesentery was then transected using a LigaSure device. The ileocolic and left colic vessels were suture ligated. The distal portion of the terminal ileum was then transected with the same curvilinear stapler, blue load. At this time the specimen was handed off and sent to pathology for permanent.  At this time we irrigated out the abdominal cavity and checked for hemostasis. Hemostasis was excellent.  At this time a Fransisca Kaufmann was used to raise the skin at the right lower quadrant area midway between the umbilicus and the right ASIS. the subcutaneous fat was excised. At this time the rectus muscles were retracted laterally. The peritoneum  was incised. This allowed 2 fingerbreadths. A Kary Kos was used to bring out the terminal ileum through the ostomy site.  At this time the midline was reapproximated using the #1 PDS in a standard running fashion 2. The skin was stapled. The midline wound was  then protected the terminal ileum staple line was then transected with electrocautery. The ileostomy was brought to the skin and sutured to the skin edges in a Brooke fashion at the 3:00/6:00/9:00/12:00 positions using 2-0 Vicryl's. The edges of the ostomy were then secured to the skin edges with 2-0 Vicryl's. The ostomy appliance was then brought over the ostomy. The midline wound was then dressed with honeycomb dressing.  At the end of the case the Foley was manipulated and appeared to be bloody in nature which change from the initial insertion. Dr. Matilde Sprang was consulted intraoperatively. Please see his operative note. Subsequent to this the patient was taken to the ICU in guarded condition intubated.     PLAN OF CARE: Admit to inpatient   PATIENT DISPOSITION:  ICU - intubated and hemodynamically stable.   Delay start of Pharmacological VTE agent (>24hrs) due to surgical blood loss or risk of bleeding: yes

## 2015-03-29 NOTE — Progress Notes (Signed)
Report called to Marya Amsler, RN. All questions answered, will transfer to 27M.

## 2015-03-29 NOTE — Consult Note (Signed)
Keane Police January 26, 1941  [id number].   Primary Care MD: none here Requesting MD: Dr Zenovia Jarred Chief Complaint/Reason for Consult: GI bleed HPI: This is a 74 yo black male with a h/o dementia, HTN, DM, Hep C, prior GI bleed in 2015, and prostate cancer.  The patient is intubated and can not provide a history.  His wife isn't here either.  According to the chart, the patient and his wife may be moving to [place] soon from [place].  He was having bloody stools at home and his wife made him come to the ED for evaluation.  He was admitted to the SDU.  He began getting unstable last night after multiple large bloody BMs.  The patient has received 10 units of pRBCs, 3 packs of platelets, and now a total of 6 FFP.  The patient was just endoscoped which was negative and unable to colonoscope due to briskness of bleeding.  We have been asked to see the patient for emergent surgical intervention.  ROS : unable to obtain, on vent  History reviewed. No pertinent family history.  Past Medical History  Diagnosis Date  . Memory loss   . Hypertension   . Diabetes mellitus without complication   . Diverticulosis     Of colon  . Hepatitis C     Virus has never been treated. No history of cirrhosis.  . Prostate cancer ~ 1990s.    Treated with radiation seed implant.  . Hiatal hernia     Does not require meds for reflux symptoms.    Past Surgical History  Procedure Laterality Date  . Laparoscopic cholecystectomy  ~ 2003  . Insertion prostate radiation seed  1990s    Social History:  reports that he has never smoked. He does not have any smokeless tobacco history on file. He reports that he drinks alcohol. He reports that he does not use illicit drugs.  Allergies: No Known Allergies  Medications Prior to Admission  Medication Sig Dispense Refill  . lisinopril (PRINIVIL,ZESTRIL) 20 MG tablet Take 20 mg by mouth daily.    . metFORMIN (GLUCOPHAGE) 500 MG tablet Take 500 mg by mouth 2 (two) times daily  with a meal.      Blood pressure 128/87, pulse 111, temperature 99.4 F (37.4 C), temperature source Oral, resp. rate 25, height 5' 11"  (1.803 m), weight 101.1 kg (222 lb 14.2 oz), SpO2 99 %. Physical Exam: General:intubated critically ill black male HEENT: head is normocephalic, atraumatic.  Sclera are noninjected.  PERRL.  Ears and nose without any masses or lesions.  Mouth is pink with ETT in place Heart: tachy but sinus.  Normal s1,s2. No obvious murmurs, gallops, or rubs noted.  Palpable radial and pedal pulses bilaterally Lungs: CTAB, no wheezes, rhonchi, or rales noted.  Respiratory effort nonlabored Abd: soft, obese, ND, +BS, no masses, hernias, or organomegaly MS: all 4 extremities are symmetrical with no cyanosis, clubbing, or edema. Skin: warm and dry with no masses, lesions, or rashes Psych: unable to obtain.    Results for orders placed or performed during the hospital encounter of 03/27/15 (from the past 48 hour(s))  Comprehensive metabolic panel     Status: Abnormal   Collection Time: 03/27/15  2:00 PM  Result Value Ref Range   Sodium 139 135 - 145 mmol/L   Potassium 4.0 3.5 - 5.1 mmol/L   Chloride 110 101 - 111 mmol/L   CO2 22 22 - 32 mmol/L   Glucose, Bld 143 (H) 65 - 99  mg/dL   BUN 31 (H) 6 - 20 mg/dL   Creatinine, Ser 1.54 (H) 0.61 - 1.24 mg/dL   Calcium 9.3 8.9 - 10.3 mg/dL   Total Protein 6.3 (L) 6.5 - 8.1 g/dL   Albumin 3.4 (L) 3.5 - 5.0 g/dL   AST 29 15 - 41 U/L   ALT 32 17 - 63 U/L   Alkaline Phosphatase 51 38 - 126 U/L   Total Bilirubin 0.7 0.3 - 1.2 mg/dL   GFR calc non Af Amer 43 (L) >60 mL/min   GFR calc Af Amer 50 (L) >60 mL/min    Comment: (NOTE) The eGFR has been calculated using the CKD EPI equation. This calculation has not been validated in all clinical situations. eGFR's persistently <60 mL/min signify possible Chronic Kidney Disease.    Anion gap 7 5 - 15  CBC     Status: Abnormal   Collection Time: 03/27/15  2:00 PM  Result Value Ref  Range   WBC 11.3 (H) 4.0 - 10.5 K/uL   RBC 3.06 (L) 4.22 - 5.81 MIL/uL   Hemoglobin 9.7 (L) 13.0 - 17.0 g/dL   HCT 28.8 (L) 39.0 - 52.0 %   MCV 94.1 78.0 - 100.0 fL   MCH 31.7 26.0 - 34.0 pg   MCHC 33.7 30.0 - 36.0 g/dL   RDW 13.6 11.5 - 15.5 %   Platelets 205 150 - 400 K/uL  Type and screen     Status: None (Preliminary result)   Collection Time: 03/27/15  2:00 PM  Result Value Ref Range   ABO/RH(D) O POS    Antibody Screen NEG    Sample Expiration 03/30/2015    Unit Number [id number]    Blood Component Type RBC LR PHER2    Unit division 00    Status of Unit ISSUED,FINAL    Transfusion Status OK TO TRANSFUSE    Crossmatch Result Compatible    Unit Number [id number]    Blood Component Type RBC LR PHER1    Unit division 00    Status of Unit ISSUED,FINAL    Transfusion Status OK TO TRANSFUSE    Crossmatch Result Compatible    Unit Number [id number]    Blood Component Type RED CELLS,LR    Unit division 00    Status of Unit ISSUED    Transfusion Status OK TO TRANSFUSE    Crossmatch Result Compatible    Unit Number [id number]    Blood Component Type RED CELLS,LR    Unit division 00    Status of Unit ISSUED    Transfusion Status OK TO TRANSFUSE    Crossmatch Result Compatible    Unit Number [id number]    Blood Component Type RED CELLS,LR    Unit division 00    Status of Unit ISSUED    Transfusion Status OK TO TRANSFUSE    Crossmatch Result Compatible    Unit Number [id number]    Blood Component Type RED CELLS,LR    Unit division 00    Status of Unit ISSUED    Transfusion Status OK TO TRANSFUSE    Crossmatch Result Compatible    Unit Number [id number]    Blood Component Type RBC LR PHER1    Unit division 00    Status of Unit ISSUED    Transfusion Status OK TO TRANSFUSE    Crossmatch Result Compatible    Unit Number [id number]    Blood Component Type RBC LR PHER2    Unit division 00    Status  of Unit ALLOCATED    Transfusion Status OK TO  TRANSFUSE    Crossmatch Result Compatible    Unit Number [id number]    Blood Component Type RBC LR PHER2    Unit division 00    Status of Unit ALLOCATED    Transfusion Status OK TO TRANSFUSE    Crossmatch Result Compatible    Unit Number [id number]    Blood Component Type RBC LR PHER1    Unit division 00    Status of Unit ISSUED    Transfusion Status OK TO TRANSFUSE    Crossmatch Result Compatible    Unit Number [id number]    Blood Component Type RED CELLS,LR    Unit division 00    Status of Unit ISSUED    Transfusion Status OK TO TRANSFUSE    Crossmatch Result Compatible    Unit Number [id number]    Blood Component Type RED CELLS,LR    Unit division 00    Status of Unit ISSUED    Transfusion Status OK TO TRANSFUSE    Crossmatch Result Compatible    Unit Number [id number]    Blood Component Type RED CELLS,LR    Unit division 00    Status of Unit ISSUED    Transfusion Status OK TO TRANSFUSE    Crossmatch Result Compatible    Unit Number [id number]    Blood Component Type RED CELLS,LR    Unit division 00    Status of Unit ISSUED    Transfusion Status OK TO TRANSFUSE    Crossmatch Result Compatible    Unit Number [id number]    Blood Component Type RED CELLS,LR    Unit division 00    Status of Unit ISSUED    Transfusion Status OK TO TRANSFUSE    Crossmatch Result Compatible   Protime-INR     Status: None   Collection Time: 03/27/15  2:00 PM  Result Value Ref Range   Prothrombin Time 14.7 11.6 - 15.2 seconds   INR 1.13 0.00 - 1.49  APTT     Status: Abnormal   Collection Time: 03/27/15  2:00 PM  Result Value Ref Range   aPTT 21 (L) 24 - 37 seconds  ABO/Rh     Status: None   Collection Time: 03/27/15  2:00 PM  Result Value Ref Range   ABO/RH(D) O POS   Hemoglobin A1c     Status: Abnormal   Collection Time: 03/27/15  2:00 PM  Result Value Ref Range   Hgb A1c MFr Bld 6.1 (H) 4.8 - 5.6 %    Comment: (NOTE)         Pre-diabetes: 5.7 - 6.4          Diabetes: >6.4         Glycemic control for adults with diabetes: <7.0    Mean Plasma Glucose 128 mg/dL    Comment: (NOTE) Performed At: [facility] [place], [place] [id number] Lindon Romp MD SN:[id number]   Prepare RBC     Status: None   Collection Time: 03/27/15  2:56 PM  Result Value Ref Range   Order Confirmation ORDER PROCESSED BY BLOOD BANK   CBG monitoring, ED     Status: None   Collection Time: 03/27/15  4:39 PM  Result Value Ref Range   Glucose-Capillary 85 65 - 99 mg/dL  MRSA PCR Screening     Status: None   Collection Time: 03/27/15  7:21 PM  Result Value Ref Range   MRSA by PCR  NEGATIVE NEGATIVE    Comment:        The GeneXpert MRSA Assay (FDA approved for NASAL specimens only), is one component of a comprehensive MRSA colonization surveillance program. It is not intended to diagnose MRSA infection nor to guide or monitor treatment for MRSA infections.   Glucose, capillary     Status: None   Collection Time: 03/27/15  7:29 PM  Result Value Ref Range   Glucose-Capillary 98 65 - 99 mg/dL  Glucose, capillary     Status: Abnormal   Collection Time: 03/27/15 11:04 PM  Result Value Ref Range   Glucose-Capillary 105 (H) 65 - 99 mg/dL  Prepare RBC     Status: None   Collection Time: 03/28/15 12:44 AM  Result Value Ref Range   Order Confirmation ORDER PROCESSED BY BLOOD BANK   CBC     Status: Abnormal   Collection Time: 03/28/15  1:57 AM  Result Value Ref Range   WBC 10.7 (H) 4.0 - 10.5 K/uL   RBC 2.37 (L) 4.22 - 5.81 MIL/uL   Hemoglobin 7.1 (L) 13.0 - 17.0 g/dL    Comment: REPEATED TO VERIFY   HCT 21.5 (L) 39.0 - 52.0 %   MCV 90.7 78.0 - 100.0 fL   MCH 30.0 26.0 - 34.0 pg   MCHC 33.0 30.0 - 36.0 g/dL   RDW 16.0 (H) 11.5 - 15.5 %   Platelets 174 150 - 400 K/uL  Glucose, capillary     Status: Abnormal   Collection Time: 03/28/15  3:47 AM  Result Value Ref Range   Glucose-Capillary 120 (H) 65 - 99 mg/dL  Glucose, capillary      Status: Abnormal   Collection Time: 03/28/15  7:48 AM  Result Value Ref Range   Glucose-Capillary 115 (H) 65 - 99 mg/dL   Comment 1 Notify RN    Comment 2 Document in Chart   CBC     Status: Abnormal   Collection Time: 03/28/15  8:10 AM  Result Value Ref Range   WBC 9.6 4.0 - 10.5 K/uL   RBC 2.70 (L) 4.22 - 5.81 MIL/uL   Hemoglobin 8.2 (L) 13.0 - 17.0 g/dL   HCT 24.4 (L) 39.0 - 52.0 %   MCV 90.4 78.0 - 100.0 fL   MCH 30.4 26.0 - 34.0 pg   MCHC 33.6 30.0 - 36.0 g/dL   RDW 15.4 11.5 - 15.5 %   Platelets 152 150 - 400 K/uL  Comprehensive metabolic panel     Status: Abnormal   Collection Time: 03/28/15  8:10 AM  Result Value Ref Range   Sodium 141 135 - 145 mmol/L   Potassium 4.2 3.5 - 5.1 mmol/L   Chloride 117 (H) 101 - 111 mmol/L   CO2 21 (L) 22 - 32 mmol/L   Glucose, Bld 116 (H) 65 - 99 mg/dL   BUN 16 6 - 20 mg/dL   Creatinine, Ser 1.09 0.61 - 1.24 mg/dL   Calcium 7.8 (L) 8.9 - 10.3 mg/dL   Total Protein 4.4 (L) 6.5 - 8.1 g/dL   Albumin 2.3 (L) 3.5 - 5.0 g/dL   AST 22 15 - 41 U/L   ALT 21 17 - 63 U/L   Alkaline Phosphatase 34 (L) 38 - 126 U/L   Total Bilirubin 0.9 0.3 - 1.2 mg/dL   GFR calc non Af Amer >60 >60 mL/min   GFR calc Af Amer >60 >60 mL/min    Comment: (NOTE) The eGFR has been calculated using the CKD  EPI equation. This calculation has not been validated in all clinical situations. eGFR's persistently <60 mL/min signify possible Chronic Kidney Disease.    Anion gap 3 (L) 5 - 15  TSH     Status: None   Collection Time: 03/28/15  8:10 AM  Result Value Ref Range   TSH 0.430 0.350 - 4.500 uIU/mL  Prepare RBC     Status: None   Collection Time: 03/28/15  8:43 AM  Result Value Ref Range   Order Confirmation ORDER PROCESSED BY BLOOD BANK   Glucose, capillary     Status: Abnormal   Collection Time: 03/28/15 11:36 AM  Result Value Ref Range   Glucose-Capillary 107 (H) 65 - 99 mg/dL   Comment 1 Notify RN    Comment 2 Document in Chart   Hemoglobin and  hematocrit, blood     Status: Abnormal   Collection Time: 03/28/15  2:20 PM  Result Value Ref Range   Hemoglobin 9.6 (L) 13.0 - 17.0 g/dL   HCT 28.6 (L) 39.0 - 52.0 %  Glucose, capillary     Status: Abnormal   Collection Time: 03/28/15  3:40 PM  Result Value Ref Range   Glucose-Capillary 103 (H) 65 - 99 mg/dL  Glucose, capillary     Status: Abnormal   Collection Time: 03/28/15  8:09 PM  Result Value Ref Range   Glucose-Capillary 116 (H) 65 - 99 mg/dL  Glucose, capillary     Status: Abnormal   Collection Time: 03/28/15 11:33 PM  Result Value Ref Range   Glucose-Capillary 129 (H) 65 - 99 mg/dL   Comment 1 Notify RN   Hemoglobin and hematocrit, blood     Status: Abnormal   Collection Time: 03/29/15 12:07 AM  Result Value Ref Range   Hemoglobin 7.0 (L) 13.0 - 17.0 g/dL    Comment: REPEATED TO VERIFY DELTA CHECK NOTED RESULT CALLED TO, READ BACK BY AND VERIFIED WITH: CHARLES MOSLEY,RN AT 9390 03/29/15. K.PAXTON    HCT 20.0 (L) 39.0 - 52.0 %  Prepare RBC     Status: None   Collection Time: 03/29/15  1:20 AM  Result Value Ref Range   Order Confirmation ORDER PROCESSED BY BLOOD BANK   Glucose, capillary     Status: Abnormal   Collection Time: 03/29/15  3:06 AM  Result Value Ref Range   Glucose-Capillary 149 (H) 65 - 99 mg/dL   Comment 1 Notify RN   Prepare RBC     Status: None   Collection Time: 03/29/15  5:00 AM  Result Value Ref Range   Order Confirmation ORDER PROCESSED BY BLOOD BANK   Prepare fresh frozen plasma     Status: None (Preliminary result)   Collection Time: 03/29/15  5:21 AM  Result Value Ref Range   Unit Number [id number]    Blood Component Type THAWED PLASMA    Unit division 00    Status of Unit ISSUED    Transfusion Status OK TO TRANSFUSE    Unit Number [id number]    Blood Component Type THAWED PLASMA    Unit division 00    Status of Unit ISSUED    Transfusion Status OK TO TRANSFUSE    Unit Number [id number]    Blood Component Type THAWED PLASMA     Unit division 00    Status of Unit ISSUED    Transfusion Status OK TO TRANSFUSE    Unit Number [id number]    Blood Component Type THAWED PLASMA    Unit division 00  Status of Unit ISSUED    Transfusion Status OK TO TRANSFUSE   Prepare Pheresed Platelets     Status: None (Preliminary result)   Collection Time: 03/29/15  5:22 AM  Result Value Ref Range   Unit Number [id number]    Blood Component Type PLTPHER LR2    Unit division 00    Status of Unit ISSUED    Transfusion Status OK TO TRANSFUSE    Unit Number [id number]    Blood Component Type PLTPHER LR1    Unit division 00    Status of Unit ISSUED    Transfusion Status OK TO TRANSFUSE   I-STAT 3, arterial blood gas (G3+)     Status: Abnormal   Collection Time: 03/29/15  7:42 AM  Result Value Ref Range   pH, Arterial 7.207 (L) 7.350 - 7.450   pCO2 arterial 44.4 35.0 - 45.0 mmHg   pO2, Arterial 15.0 (LL) 80.0 - 100.0 mmHg   Bicarbonate 17.6 (L) 20.0 - 24.0 mEq/L   TCO2 19 0 - 100 mmol/L   O2 Saturation 14.0 %   Acid-base deficit 9.0 (H) 0.0 - 2.0 mmol/L   Patient temperature 99.4 F    Collection site RADIAL, ALLEN'S TEST ACCEPTABLE    Drawn by Operator    Sample type ARTERIAL    Comment NOTIFIED PHYSICIAN   Glucose, capillary     Status: Abnormal   Collection Time: 03/29/15  7:46 AM  Result Value Ref Range   Glucose-Capillary 179 (H) 65 - 99 mg/dL  DIC (disseminated intravasc coag) panel (STAT)     Status: None (Preliminary result)   Collection Time: 03/29/15  7:56 AM  Result Value Ref Range   Prothrombin Time PENDING 11.6 - 15.2 seconds   INR PENDING 0.00 - 1.49   aPTT PENDING 24 - 37 seconds   Fibrinogen PENDING 204 - 475 mg/dL   D-Dimer, Quant PENDING 0.00 - 0.48 ug/mL-FEU   Platelets 158 150 - 400 K/uL   Smear Review PENDING   Hemoglobin and hematocrit, blood (STAT)     Status: Abnormal   Collection Time: 03/29/15  7:56 AM  Result Value Ref Range   Hemoglobin 6.0 (LL) 13.0 - 17.0 g/dL    Comment:  CRITICAL RESULT CALLED TO, READ BACK BY AND VERIFIED WITH: REPEATED TO VERIFY MICHELLE TOLER RN AT 0810 03/29/15 BY WOOLLENK    HCT 17.5 (L) 39.0 - 45.0 %  Basic metabolic panel     Status: Abnormal   Collection Time: 03/29/15  7:57 AM  Result Value Ref Range   Sodium 142 135 - 145 mmol/L   Potassium 4.0 3.5 - 5.1 mmol/L   Chloride 118 (H) 101 - 111 mmol/L   CO2 20 (L) 22 - 32 mmol/L   Glucose, Bld 203 (H) 65 - 99 mg/dL   BUN 16 6 - 20 mg/dL   Creatinine, Ser 1.11 0.61 - 1.24 mg/dL   Calcium 7.7 (L) 8.9 - 10.3 mg/dL   GFR calc non Af Amer >60 >60 mL/min   GFR calc Af Amer >60 >60 mL/min    Comment: (NOTE) The eGFR has been calculated using the CKD EPI equation. This calculation has not been validated in all clinical situations. eGFR's persistently <60 mL/min signify possible Chronic Kidney Disease.    Anion gap 4 (L) 5 - 15  Prepare platelet pheresis     Status: None (Preliminary result)   Collection Time: 03/29/15  8:24 AM  Result Value Ref Range   Unit Number [id number]  Blood Component Type PLTP LR3 PAS    Unit division 00    Status of Unit ISSUED    Transfusion Status OK TO TRANSFUSE   Prepare fresh frozen plasma     Status: None (Preliminary result)   Collection Time: 03/29/15  8:24 AM  Result Value Ref Range   Unit Number [id number]    Blood Component Type THAWED PLASMA    Unit division 00    Status of Unit ISSUED    Transfusion Status OK TO TRANSFUSE    Unit Number [id number]    Blood Component Type THAWED PLASMA    Unit division 00    Status of Unit ISSUED    Transfusion Status OK TO TRANSFUSE   I-STAT 3, arterial blood gas (G3+)     Status: Abnormal   Collection Time: 03/29/15  8:28 AM  Result Value Ref Range   pH, Arterial 7.358 7.350 - 7.450   pCO2 arterial 36.4 35.0 - 45.0 mmHg   pO2, Arterial 522.0 (H) 80.0 - 100.0 mmHg   Bicarbonate 20.5 20.0 - 24.0 mEq/L   TCO2 22 0 - 100 mmol/L   O2 Saturation 100.0 %   Acid-base deficit 5.0 (H) 0.0 -  2.0 mmol/L   Collection site ARTERIAL LINE    Drawn by RT    Sample type ARTERIAL    Dg Chest Port 1 View  03/29/2015   CLINICAL DATA:  Cardiac arrest  EXAM: PORTABLE CHEST - 1 VIEW  COMPARISON:  03/29/2015 at 7:  17  FINDINGS: Endotracheal tube and LEFT central venous line unchanged. Pad artifact overlies central chest. Normal cardiac silhouette. There is LEFT basilar atelectasis. No pneumothorax.  IMPRESSION: Mild increased LEFT basilar atelectasis.  Endotracheal tube unchanged.  No pneumothorax.   Electronically Signed   By: Suzy Bouchard M.D.   On: 03/29/2015 08:41   Dg Chest Port 1 View  03/29/2015   CLINICAL DATA:  intubation central line placement.  EXAM: PORTABLE CHEST - 1 VIEW  COMPARISON:  None.  FINDINGS: Endotracheal tube 7 cm from carina. Normal cardiac silhouette. No effusion, infiltrate, pneumothorax. RIGHT IJ sheath.  IMPRESSION: Endotracheal tube 7 cm from carina.   Electronically Signed   By: Suzy Bouchard M.D.   On: 03/29/2015 07:46   Dg Chest Port 1 View  03/29/2015   CLINICAL DATA:  74 year old male status post central line placement.  EXAM: PORTABLE CHEST - 1 VIEW  COMPARISON:  Chest x-ray a 03/29/2015.  FINDINGS: New left internal jugular central venous catheter with tip terminating at the superior cavoatrial junction. Right IJ central venous Cordis with tip in the internal jugular vein. An endotracheal tube is in place with tip 7.1 cm above the carina. Lung volumes are low. Bibasilar opacities (left greater than right), most compatible subsegmental atelectasis. No consolidative airspace disease. No pneumothorax. No pleural effusions. No evidence of pulmonary edema. Heart size and mediastinal contours are within normal limits.  IMPRESSION: 1. Support apparatus, as above. New left internal jugular central venous catheter tip terminates at the superior cavoatrial junction. 2. No pneumothorax or other complicating features. 3. Low lung volumes with bibasilar subsegmental  atelectasis.   Electronically Signed   By: Vinnie Langton M.D.   On: 03/29/2015 07:33       Assessment/Plan 1. Massive GI bleed, suspect lower in origin -patient had a colonoscopy last year in [place] which revealed diverticulosis.  He just had an upper endo that is negative.  He is too unstable to go to IR for embolization.  We will plan emergent surgical intervention with total abdominal colectomy and ileostomy.  Dr. Rosendo Gros has called the patient's wife on the phone and informed her of this.  He discussed the procedure including risks and complications with her.  She is agreeable to proceed with surgical intervention. 2. Hemorrhagic shock -cont transfusion of blood products as needed 3. MMP -per primary service  Nairi Oswald E 03/29/2015, 8:46 AM Pager: [phone]

## 2015-03-29 NOTE — Progress Notes (Signed)
CRITICAL VALUE ALERT  Critical value received:  Hemoglobin 6.0  Date of notification:  03/29/15  Time of notification:  0810  Critical value read back:Yes.    Nurse who received alert:  Allegra Grana, RN  MD notified (1st page):  Dr Nelda Marseille  Time of first page:  [phone]  MD notified (2nd page):  Time of second page:  Responding MD:  Dr Nelda Marseille  Time MD responded:  [phone]

## 2015-03-29 NOTE — Progress Notes (Signed)
In the last 10-15 min the patient has large volume hemochezia and has again become hemodynamically unstable Getting resuscitation with IVFs, pRBC, FFR, plts.  Now on phenylephrine, and norepi gtt ICU managing, ruling out tension pneumothorax after central line placement I have consulted surgery and spoken with Dr. Rosendo Gros Too unstable currently for angiography procedure with IR Plan EGD when stable enough to proceed

## 2015-03-29 NOTE — Anesthesia Preprocedure Evaluation (Signed)
Anesthesia Evaluation  Patient identified by MRN, date of birth, ID band Patient unresponsive    Reviewed: Patient's Chart, lab work & pertinent test resultsPreop documentation limited or incomplete due to emergent nature of procedure.  Airway        Dental   Pulmonary          Cardiovascular hypertension,     Neuro/Psych    GI/Hepatic   Endo/Other  diabetes  Renal/GU      Musculoskeletal   Abdominal   Peds  Hematology   Anesthesia Other Findings   Reproductive/Obstetrics                             Anesthesia Physical Anesthesia Plan  ASA: V and emergent  Anesthesia Plan: General   Post-op Pain Management:    Induction: Inhalational  Airway Management Planned: Oral ETT  Additional Equipment:   Intra-op Plan:   Post-operative Plan: Post-operative intubation/ventilation  Informed Consent: I have reviewed the patients History and Physical, chart, labs and discussed the procedure including the risks, benefits and alternatives for the proposed anesthesia with the patient or authorized representative who has indicated his/her understanding and acceptance.     Plan Discussed with: CRNA and Surgeon  Anesthesia Plan Comments:         Anesthesia Quick Evaluation

## 2015-03-29 NOTE — Progress Notes (Signed)
Accepted pt from night shift nurse. Assessed pt. GI present at bedside. Pt actively bleeding. Pt receiving multiple blood products and normal saline boluses. Pts BP is stable per cuff and A-line. Pts HR 120-140's. Pts O2sat is 97% on 100%Fio2. Elink page via elink button. Dr. Nelda Marseille arrived at bedside simultaneously. [facility] present at bedside as well. Will continue transfuse blood products. Will continue to monitor pt.

## 2015-03-29 NOTE — Procedures (Signed)
Arterial Catheter Insertion Procedure Note Rykar Lebleu [id number] 03/28/1941  Procedure: Insertion of Arterial Catheter  Indications: Blood pressure monitoring  Procedure Details Consent: Unable to obtain consent because of emergent medical necessity. Time Out: Verified patient identification, verified procedure, site/side was marked, verified correct patient position, special equipment/implants available, medications/allergies/relevent history reviewed, required imaging and test results available.  Performed  Maximum sterile technique was used including antiseptics, cap, gloves, gown, hand hygiene, mask and sheet. Skin prep: Chlorhexidine; local anesthetic administered 20 gauge catheter was inserted into right radial artery using the Seldinger technique.  Evaluation Blood flow good; BP tracing good. Complications: No apparent complications.   [name] 03/29/2015

## 2015-03-29 NOTE — Progress Notes (Addendum)
[facility] Progress Note Patient Name: Jesse [name] DOB: Dec 22, 1940 MRN: [id number]   Date of Service  03/29/2015  HPI/Events of Note  Lactic Acid = 3.1. Last Hgb = 8.2.  eICU Interventions  Will order: 1. Bolus with 0.9 NaCl 1 liter IV over 1 hour now. 2. Monitor CVP now and Q 4 hours.     Intervention Category Major Interventions: Acid-Base disturbance - evaluation and management;Shock - evaluation and management  Alie Hardgrove Eugene 03/29/2015, 5:27 PM

## 2015-03-29 NOTE — Progress Notes (Addendum)
[facility] Progress Note Patient Name: Jesse [name] DOB: [birth year] MRN: [id number]   Date of Service  03/29/2015  HPI/Events of Note  Agitation. Requires frequent Fentanyl IV pushes.   eICU Interventions  Will order: 1. Fentanyl IV infusion at 50 - 200 mcg/hour. Titrate to RASS = 0 to -1.      Intervention Category Minor Interventions: Agitation / anxiety - evaluation and management  Natasja Niday Eugene 03/29/2015, 9:10 PM

## 2015-03-29 NOTE — Anesthesia Postprocedure Evaluation (Signed)
  Anesthesia Post-op Note  Patient: Cuthbert Turton  Procedure(s) Performed: Procedure(s): EXPLORATORY LAP WITH TOTAL COLECTOMY (N/A) CYSTOSCOPY WITH PLACEMENT OF 50 FRENCH COUDE CATHETER. (N/A)  Patient Location: ICU  Anesthesia Type:General  Level of Consciousness: sedated and Patient remains intubated per anesthesia plan  Airway and Oxygen Therapy: Patient remains intubated per anesthesia plan and Patient placed on Ventilator (see vital sign flow sheet for setting)  Post-op Pain: mild  Post-op Assessment: Post-op Vital signs reviewed, Patient's Cardiovascular Status Stable, Respiratory Function Stable, Patent Airway and Pain level controlled LLE Motor Response: Purposeful movement, Responds to commands (Pt sedated UTA)   RLE Motor Response: Non-purposeful movement        Post-op Vital Signs: Reviewed and stable  Last Vitals:  Filed Vitals:   03/29/15 0900  BP: 126/64  Pulse: 116  Temp:   Resp: 22    Complications: No apparent anesthesia complications

## 2015-03-29 NOTE — Progress Notes (Addendum)
Initial Nutrition Assessment  DOCUMENTATION CODES:  Obesity unspecified  INTERVENTION:  If TF deemed appropriate, and unable to extubate. Recommend TF: start Vital HP  at 25 ml/h and Prostat 30 ml BID on day 1; on day 2, increase prostat to 60 ml TID and TF to goal rate of 35 ml/h (840 ml per day) to provide 1440 kcals, 164 gm protein, 702 ml free water daily.  NUTRITION DIAGNOSIS:  Inadequate oral intake related to inability to eat as evidenced by NPO status.  GOAL:  Patient will meet greater than or equal to 90% of their needs, Provide needs based on ASPEN/SCCM guidelines  MONITOR:  Weight trends, I & O's, Diet advancement, Vent status  REASON FOR ASSESSMENT:  Ventilator    ASSESSMENT:  74 y/o male hx dementia, type 2 DM treated with oral agents.HTN. GIB w/ hematochezia, Hepatitis C positive. Pt admitted after having hematochezia beginning 8/29 w/ weakness. Last night pt was unresponsive and in hemmorhagic shock s/p intubation and transfer to ICU. Today in OR for emergent total colectomy w/ ileostomy.   Saw patient Post op. Wife not present to obtain any history from  Patient is currently intubated on ventilator support MV: 15.6 L/min Temp (24hrs), Avg:98.4 F (36.9 C), Min:97.6 F (36.4 C), Max:99.4 F (37.4 C)  Diet Order:  Diet NPO time specified  Skin:  Reviewed, no issues  Last BM:  9/2 Diarrhea/incontinent/bloody  Height:  Ht Readings from Last 1 Encounters:  03/27/15 5\' 11"  (1.803 m)   Weight:  Wt Readings from Last 1 Encounters:  03/27/15 222 lb 14.2 oz (101.1 kg)  Admit weight 217 lbs. (98.6 kg)  Ideal Body Weight:  78.18 kg  BMI:  Body mass index is 31.1 kg/(m^2).  Estimated Nutritional Needs:  Kcal:  1085-1380 kcals (11-14 kcal/kg) Protein:  156 g Pro (2 g/kg IBW) Fluid:  Per MD  EDUCATION NEEDS:  No education needs identified at this time  Burtis Junes RD, LDN Nutrition Pager: [phone] 03/29/2015 1:39 PM

## 2015-03-29 NOTE — Op Note (Signed)
[facility] [place], [address]   ENDOSCOPY PROCEDURE REPORT  PATIENT: Jesse, [name]  MR#: [id number] BIRTHDATE: [birth year] , 74  yrs. old GENDER: male ENDOSCOPIST: Jerene Bears, MD REFERRED BY:  Rush Farmer, M.D. PROCEDURE DATE:  03/29/2015 PROCEDURE:  EGD, diagnostic ASA CLASS:     Class IV INDICATIONS:  hematochezia, acute post hemorrhagic anemia, and hemorrhagic shock. MEDICATIONS:  per ICU staff TOPICAL ANESTHETIC: none  DESCRIPTION OF PROCEDURE: After the risks benefits and alternatives of the procedure were thoroughly explained, informed consent was obtained.  The Pentax Gastroscope [id number] endoscope was introduced through the mouth and advanced to the second portion of the duodenum , Without limitations.  The instrument was slowly withdrawn as the mucosa was fully examined.      EXAM: The esophagus and gastroesophageal junction were completely normal in appearance.  The stomach was entered and closely examined.The antrum, angularis, and lesser curvature were well visualized, including a retroflexed view of the cardia and fundus. The stomach wall was normally distensible.  The scope passed easily through the pylorus into the duodenum.  Retroflexed views revealed no abnormalities.     The scope was then withdrawn from the patient and the procedure completed.  COMPLICATIONS: There were no immediate complications.  ENDOSCOPIC IMPRESSION: Normal EGD with no source of GI bleeding found Lower GI bleeding, presumed diverticular given history of diverticulosis  RECOMMENDATIONS: Surgical consultation for massive lower GI bleed and hemorrhagic shock for presumed diverticular hemorrhage  eSigned:  Jerene Bears, MD 03/29/2015 12:23 PM

## 2015-03-29 NOTE — Progress Notes (Signed)
CRITICAL VALUE ALERT  Critical value received:  LA 3.2  Date of notification: 03/29/2015   Time of notification: 5:24 PM   Critical value read back:Yes.    Nurse who received alert: Alfonse Ras, Janora Norlander   MD notified (1st page):  [facility] Dr. Corinna Lines  Time of first page: 5:24 PM   Responding MD: Dr. Lahoma Crocker  Time MD responded:  [id number]

## 2015-03-29 NOTE — Procedures (Signed)
INTUBATION PROCEDURE NOTE  Indication: Airway protection Consent: emergen Time Out: yes Medications: Etomidate, versed, rocuronium Paralytic/RSI: Delayed paralysis after ETT instered Technique: DL Blade: MAC 4 Grade 2-3 view Cords Visualized: yes View: Grade 2-3 # of attempts: 1 Tube confirmation:   Chest rise: yes  Bilateral Breath Sounds: yes  Color change on CO2 detector: yes  ETCO2: yes  CXR: Yes Successful placement: yes    Meribeth Mattes, DO., [place]

## 2015-03-29 NOTE — Progress Notes (Signed)
RT called to room to receive pt back from OR. RT placed pt on vent PRVC: VT 620, RR 25, 100%, Peep 5. RT will continue to monitor.

## 2015-03-29 NOTE — H&P (Signed)
PULMONARY / CRITICAL CARE MEDICINE Transfer note   Name: Jesse [name] MRN: [id number] DOB: [birth year]    ADMISSION DATE:  03/27/2015  CHIEF COMPLAINT:  Acute lower GIB/hemmorhagic shock  INITIAL PRESENTATION:  3 days melena   SIGNIFICANT EVENTS: Progressive hypotension/shock   HISTORY OF PRESENT ILLNESS:   Jesse [name] is a 74 y.o. male. Patient and his wife has been residents of [place], [place]. However they're probably going to be moving to [place]. 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 HTN. 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 Hiatal hernia on EGD ~ 2011. Diverticulosis on colonoscopy ~ 2011. GI bleed with hematochezia in 2015, wife not sure what the source was; did not undergo repeat endoscopic evaluation then.   Pt has had painless hematochezia daily with AM BM beginning 8/29. Wife observed weakness starting 8/31 but had to cajole pt to come to hospital. Pt denies dizziness, chest pain, abd pain. No nausea. Generally great appetite but anorexic starting yesterday.   Tonight:  PCCM called to bedside, patient was unresponsive and in shock.  He was immediately transferred to the ICU for Hemmorhagic shock, and my own history/data gathering from his wife was deferred to stabilize him.   PAST MEDICAL HISTORY :   has a past medical history of Memory loss; Hypertension; Diabetes mellitus without complication; Diverticulosis; Hepatitis C; Prostate cancer (~ 1990s.); and Hiatal hernia.  has past surgical history that includes Laparoscopic cholecystectomy (~ 2003) and Insertion prostate radiation seed (1990s). Prior to Admission medications   Medication Sig Start Date End Date Taking? Authorizing Provider  lisinopril (PRINIVIL,ZESTRIL) 20 MG tablet Take 20 mg by mouth daily.   Yes Historical Provider, MD  metFORMIN (GLUCOPHAGE) 500 MG tablet Take 500 mg by mouth 2 (two) times daily with a meal.   Yes Historical Provider, MD   No Known  Allergies  FAMILY HISTORY:  has no family status information on file.  SOCIAL HISTORY:  reports that he has never smoked. He does not have any smokeless tobacco history on file. He reports that he drinks alcohol. He reports that he does not use illicit drugs.  REVIEW OF SYSTEMS:  Unable to obtain  SUBJECTIVE:   VITAL SIGNS: Temp:  [97.5 F (36.4 C)-98.8 F (37.1 C)] 97.9 F (36.6 C) (09/03 0505) Pulse Rate:  [32-186] 186 (09/03 0600) Resp:  [17-31] 25 (09/03 0600) BP: (66-144)/(41-95) 84/61 mmHg (09/03 0600) SpO2:  [94 %-100 %] 100 % (09/03 0600) FiO2 (%):  [100 %] 100 % (09/03 0600) HEMODYNAMICS:   VENTILATOR SETTINGS: Vent Mode:  [-] PRVC FiO2 (%):  [100 %] 100 % Set Rate:  [25 bmp] 25 bmp Vt Set:  [620 mL] 620 mL PEEP:  [5 cmH20] 5 cmH20 Plateau Pressure:  [17 cmH20] 17 cmH20 INTAKE / OUTPUT:  Intake/Output Summary (Last 24 hours) at 03/29/15 0724 Last data filed at 03/29/15 0710  Gross per 24 hour  Intake 4739.45 ml  Output    950 ml  Net 3789.45 ml    PHYSICAL EXAMINATION: General:  Obtunded Neuro:  Obtunded, GCS6 HEENT:  Dry MM Cardiovascular:  Tachycardic, no m/r/g Lungs:  CTA b/l no w/r/r Abdomen:  Soft, tender, non distended, normal bowel sound Skin: Cool to touch  LABS:  CBC  Recent Labs Lab 03/27/15 1400 03/28/15 0157 03/28/15 0810 03/28/15 1420 03/29/15 0007  WBC 11.3* 10.7* 9.6  --   --   HGB 9.7* 7.1* 8.2* 9.6* 7.0*  HCT 28.8* 21.5* 24.4* 28.6* 20.0*  PLT 205 174 152  --   --    Coag's  Recent Labs Lab 03/27/15 1400  APTT 21*  INR 1.13   BMET  Recent Labs Lab 03/27/15 1400 03/28/15 0810  NA 139 141  K 4.0 4.2  CL 110 117*  CO2 22 21*  BUN 31* 16  CREATININE 1.54* 1.09  GLUCOSE 143* 116*   Electrolytes  Recent Labs Lab 03/27/15 1400 03/28/15 0810  CALCIUM 9.3 7.8*   Sepsis Markers No results for input(s): LATICACIDVEN, PROCALCITON, O2SATVEN in the last 168 hours. ABG No results for input(s): PHART, PCO2ART,  PO2ART in the last 168 hours. Liver Enzymes  Recent Labs Lab 03/27/15 1400 03/28/15 0810  AST 29 22  ALT 32 21  ALKPHOS 51 34*  BILITOT 0.7 0.9  ALBUMIN 3.4* 2.3*   Cardiac Enzymes No results for input(s): TROPONINI, PROBNP in the last 168 hours. Glucose  Recent Labs Lab 03/28/15 0748 03/28/15 1136 03/28/15 1540 03/28/15 2009 03/28/15 2333 03/29/15 0306  GLUCAP 115* 107* 103* 116* 129* 149*    Imaging No results found.   ASSESSMENT / PLAN:  74 yo male with acute lower GIB likely diverticular, with hemmorhagic shock.  PULMONARY A: Intubated for airway protection due to shock and low GCS.  P:    - vent management bundle  - ETT in correct location  - PPI gtt  - ABG  - intermittent sedation  CARDIOVASCULAR A: Acute hemmorhagic shock.  S/p 8 UPRBC since admission 3 in last 24hrs. P:   - right IJ cortis/introducer placed  - Left IJ central line placed  - massive transfusion protocol activated  - 2g Calcium gluconate IV given  - Dopamine started on floor, transitioned to phenylephrine during blood product resuscitation now off.  - continue resuscitation 1:1:1   - 1amp sodium bicarb given  - prevent/agressively treat hypothermia and acidemia  GASTROINTESTINAL A:  Acute lower GIB suspect diverticular, currently no e/o Upper source.  No history  P:    - GI consulted will scope now  - further treatment as above.  - likely Tag RBC after scope  HEMATOLOGIC A:  Hemmorhagic shock.  Etiology as above. P:   - Tx as above   FAMILY  - Updates:  Family updated   Total critical care time: 120 min  Critical care time was exclusive of separately billable procedures and treating other patients.  Critical care was necessary to treat or prevent imminent or life-threatening deterioration.  Critical care was time spent personally by me on the following activities: development of treatment plan with patient and/or surrogate as well as nursing, discussions with  consultants, evaluation of patient's response to treatment, examination of patient, obtaining history from patient or surrogate, ordering and performing treatments and interventions, ordering and review of laboratory studies, ordering and review of radiographic studies, pulse oximetry and re-evaluation of patient's condition.   Meribeth Mattes, DO., MS  Pulmonary and Critical Care Medicine        Pulmonary and [place] Pager: [phone]  03/29/2015, 7:24 AM

## 2015-03-29 NOTE — Progress Notes (Addendum)
Asked to see Pt while on unit. Pt with two bloody stools tonight, large amounts of blood. Pt BP soft overnight. Pt with severe agitation earlier this evening, however he is much less responsive at this time. Currently bleeding profusely from rectum. 1 unit PRBCs already ordered started and [facility] NP paged per floor RN. NP updated and PCCM consulted. Dr. Naaman Plummer PCCM at bedside to assess Pt. BP 66/49 at 0500, Dopamine gtt started per order Dr.Verde. Pt transferred to 2 M at 0545 and intubated. Care assumed per 2 M RN.

## 2015-03-29 NOTE — Progress Notes (Signed)
Called by nurse Pt with 2 episodes of large volume hematochezia since beginning of shift (7pm 03/27/25) Getting additional pRBC now, will be total of 7 since admisison Pt with hypotension and tachycardia, disorientation (pt did have sundowning on previous night).  Sundowning possible now, but more likely related to hemorrhagic shock Needs aggressive resuscitation Has not been able to prep for colonoscopy which was planned for later this morning I have spoken to and consulted critical care, Dr. Chase Caller.  Pt to be seen and possibly intubated for airway protection. Plan: resuscitation and stabilization with IVFs, pRBC, pressors if needed.  Intubation per CCM if needed. Most likely source felt diverticular hemorrhage. Likely needs tagged nuc RBC scan if stable to travel to radiology, though 1st will perform bedside EGD to exclude UGI bleed with rapid transit If LGIB colonoscopy without prep is very low yield, and best solution may be angiography with embolization Mobilizing endo team for EGD

## 2015-03-29 NOTE — Transfer of Care (Signed)
Immediate Anesthesia Transfer of Care Note  Patient: Jesse [name]  Procedure(s) Performed: Procedure(s): EXPLORATORY LAP WITH TOTAL COLECTOMY (N/A) CYSTOSCOPY WITH PLACEMENT OF 52 FRENCH COUDE CATHETER. (N/A)  Patient Location: ICU  Anesthesia Type:General  Level of Consciousness: sedated and Patient remains intubated per anesthesia plan  Airway & Oxygen Therapy: Patient remains intubated per anesthesia plan and Patient placed on Ventilator (see vital sign flow sheet for setting)  Post-op Assessment: Report given to RN and Post -op Vital signs reviewed and stable  Post vital signs: Reviewed and stable  Last Vitals:  Filed Vitals:   03/29/15 0900  BP: 126/64  Pulse: 116  Temp:   Resp: 22    Complications: No apparent anesthesia complications

## 2015-03-30 ENCOUNTER — Inpatient Hospital Stay (HOSPITAL_COMMUNITY): Payer: Medicare (Managed Care)

## 2015-03-30 ENCOUNTER — Encounter (HOSPITAL_COMMUNITY): Payer: Self-pay | Admitting: Internal Medicine

## 2015-03-30 LAB — PREPARE PLATELET PHERESIS
Unit division: 0
Unit division: 0
Unit division: 0
Unit division: 0

## 2015-03-30 LAB — PREPARE FRESH FROZEN PLASMA
UNIT DIVISION: 0
UNIT DIVISION: 0
UNIT DIVISION: 0
Unit division: 0
Unit division: 0
Unit division: 0

## 2015-03-30 LAB — BASIC METABOLIC PANEL
Anion gap: 5 (ref 5–15)
BUN: 12 mg/dL (ref 6–20)
CHLORIDE: 119 mmol/L — AB (ref 101–111)
CO2: 20 mmol/L — AB (ref 22–32)
CREATININE: 1.25 mg/dL — AB (ref 0.61–1.24)
Calcium: 7.6 mg/dL — ABNORMAL LOW (ref 8.9–10.3)
GFR calc Af Amer: >60 mL/min (ref >60)
GFR calc non Af Amer: 56 mL/min — ABNORMAL LOW (ref >60)
Glucose, Bld: 144 mg/dL — ABNORMAL HIGH (ref 65–99)
POTASSIUM: 3.6 mmol/L (ref 3.5–5.1)
SODIUM: 144 mmol/L (ref 135–145)

## 2015-03-30 LAB — BLOOD GAS, ARTERIAL
ACID-BASE DEFICIT: 3.3 mmol/L — AB (ref 0.0–2.0)
BICARBONATE: 18.7 meq/L — AB (ref 20.0–24.0)
Drawn by: 419771
FIO2: 0.35
LHR: 25 {breaths}/min
MECHVT: 620 mL
O2 Saturation: 98.7 %
PATIENT TEMPERATURE: 98.6
PEEP/CPAP: 5 cmH2O
PH ART: 7.559 — AB (ref 7.350–7.450)
TCO2: 19.4 mmol/L (ref 0–100)
pCO2 arterial: 21 mmHg — ABNORMAL LOW (ref 35.0–45.0)
pO2, Arterial: 104 mmHg — ABNORMAL HIGH (ref 80.0–100.0)

## 2015-03-30 LAB — POCT I-STAT 3, ART BLOOD GAS (G3+)
ACID-BASE DEFICIT: 6 mmol/L — AB (ref 0.0–2.0)
Bicarbonate: 19.3 mEq/L — ABNORMAL LOW (ref 20.0–24.0)
O2 Saturation: 95 %
PCO2 ART: 39.2 mmHg (ref 35.0–45.0)
PH ART: 7.302 — AB (ref 7.350–7.450)
PO2 ART: 86 mmHg (ref 80.0–100.0)
Patient temperature: 99.4
TCO2: 20 mmol/L (ref 0–100)

## 2015-03-30 LAB — CBC
HEMATOCRIT: 23.5 % — AB (ref 39.0–52.0)
HEMOGLOBIN: 8.3 g/dL — AB (ref 13.0–17.0)
MCH: 30.2 pg (ref 26.0–34.0)
MCHC: 35.3 g/dL (ref 30.0–36.0)
MCV: 85.5 fL (ref 78.0–100.0)
Platelets: 128 10*3/uL — ABNORMAL LOW (ref 150–400)
RBC: 2.75 MIL/uL — AB (ref 4.22–5.81)
RDW: 15 % (ref 11.5–15.5)
WBC: 10.4 10*3/uL (ref 4.0–10.5)

## 2015-03-30 LAB — GLUCOSE, CAPILLARY
GLUCOSE-CAPILLARY: 118 mg/dL — AB (ref 65–99)
GLUCOSE-CAPILLARY: 149 mg/dL — AB (ref 65–99)
Glucose-Capillary: 131 mg/dL — ABNORMAL HIGH (ref 65–99)
Glucose-Capillary: 134 mg/dL — ABNORMAL HIGH (ref 65–99)
Glucose-Capillary: 138 mg/dL — ABNORMAL HIGH (ref 65–99)
Glucose-Capillary: 160 mg/dL — ABNORMAL HIGH (ref 65–99)

## 2015-03-30 LAB — CALCIUM, IONIZED: CALCIUM, IONIZED, SERUM: 4.3 mg/dL — AB (ref 4.5–5.6)

## 2015-03-30 LAB — MAGNESIUM: MAGNESIUM: 1.3 mg/dL — AB (ref 1.7–2.4)

## 2015-03-30 LAB — PHOSPHORUS: PHOSPHORUS: 1.6 mg/dL — AB (ref 2.5–4.6)

## 2015-03-30 MED ORDER — ETOMIDATE 2 MG/ML IV SOLN
INTRAVENOUS | Status: AC
  Administered 2015-03-30: 20 mg
  Filled 2015-03-30: qty 10

## 2015-03-30 MED ORDER — MIDAZOLAM HCL 2 MG/2ML IJ SOLN
INTRAMUSCULAR | Status: AC
  Administered 2015-03-30: 2 mg
  Filled 2015-03-30: qty 4

## 2015-03-30 MED ORDER — FENTANYL CITRATE (PF) 100 MCG/2ML IJ SOLN
INTRAMUSCULAR | Status: AC
  Administered 2015-03-30: 100 ug
  Filled 2015-03-30: qty 4

## 2015-03-30 MED ORDER — MAGNESIUM SULFATE 2 GM/50ML IV SOLN
INTRAVENOUS | Status: AC
  Administered 2015-03-30: 2 g
  Filled 2015-03-30: qty 50

## 2015-03-30 MED ORDER — DEXMEDETOMIDINE HCL IN NACL 400 MCG/100ML IV SOLN
0.4000 ug/kg/h | INTRAVENOUS | Status: DC
  Administered 2015-03-30: 0.4 ug/kg/h via INTRAVENOUS
  Administered 2015-03-31: 0.8 ug/kg/h via INTRAVENOUS
  Administered 2015-03-31 (×2): 0.5 ug/kg/h via INTRAVENOUS
  Administered 2015-03-31: 0.6 ug/kg/h via INTRAVENOUS
  Administered 2015-03-31: 0.8 ug/kg/h via INTRAVENOUS
  Administered 2015-03-31: 0.5 ug/kg/h via INTRAVENOUS
  Administered 2015-04-01 (×2): 0.4 ug/kg/h via INTRAVENOUS
  Administered 2015-04-02: 0.7 ug/kg/h via INTRAVENOUS
  Administered 2015-04-02: 0.6 ug/kg/h via INTRAVENOUS
  Administered 2015-04-02: 0.5 ug/kg/h via INTRAVENOUS
  Administered 2015-04-02: 0.7 ug/kg/h via INTRAVENOUS
  Administered 2015-04-02: 0.6 ug/kg/h via INTRAVENOUS
  Administered 2015-04-03: 0.9 ug/kg/h via INTRAVENOUS
  Filled 2015-03-30 (×3): qty 100
  Filled 2015-03-30: qty 50
  Filled 2015-03-30: qty 100
  Filled 2015-03-30: qty 50
  Filled 2015-03-30 (×3): qty 100
  Filled 2015-03-30: qty 50
  Filled 2015-03-30: qty 100
  Filled 2015-03-30: qty 50
  Filled 2015-03-30: qty 100
  Filled 2015-03-30: qty 50
  Filled 2015-03-30 (×2): qty 100

## 2015-03-30 MED ORDER — SODIUM CHLORIDE 0.9 % IV BOLUS (SEPSIS)
1000.0000 mL | Freq: Once | INTRAVENOUS | Status: AC
  Administered 2015-03-30: 1000 mL via INTRAVENOUS

## 2015-03-30 MED ORDER — MAGNESIUM SULFATE 2 GM/50ML IV SOLN: 2.0000 g | Freq: Once | INTRAVENOUS | Status: AC

## 2015-03-30 MED ORDER — FENTANYL CITRATE (PF) 100 MCG/2ML IJ SOLN
25.0000 ug | INTRAMUSCULAR | Status: DC | PRN
  Administered 2015-03-30 – 2015-04-03 (×4): 25 ug via INTRAVENOUS
  Filled 2015-03-30 (×3): qty 2

## 2015-03-30 MED ORDER — DEXTROSE 5 % IV SOLN
2.0000 g | Freq: Once | INTRAVENOUS | Status: DC
  Filled 2015-03-30: qty 4

## 2015-03-30 MED ORDER — VECURONIUM BROMIDE 10 MG IV SOLR
INTRAVENOUS | Status: AC
  Administered 2015-03-30: 10 mg
  Filled 2015-03-30: qty 10

## 2015-03-30 MED ORDER — FENTANYL CITRATE (PF) 2500 MCG/50ML IJ SOLN
25.0000 ug/h | INTRAMUSCULAR | Status: DC
  Administered 2015-03-30: 50 ug/h via INTRAVENOUS
  Administered 2015-03-31: 250 ug/h via INTRAVENOUS
  Administered 2015-04-01: 200 ug/h via INTRAVENOUS
  Administered 2015-04-01 – 2015-04-02 (×2): 175 ug/h via INTRAVENOUS
  Filled 2015-03-30 (×6): qty 50

## 2015-03-30 NOTE — Progress Notes (Signed)
PULMONARY / CRITICAL CARE MEDICINE Transfer note   Name: Jesse [name] MRN: [id number] DOB: February 12, 1941    ADMISSION DATE:  03/27/2015  CHIEF COMPLAINT:  Acute lower GIB/hemmorhagic shock  INITIAL PRESENTATION:  3 days melena   SIGNIFICANT EVENTS: Progressive hypotension/shock   HISTORY OF PRESENT ILLNESS:   Jesse [name] is a 74 y.o. male. Patient and his wife has been residents of [place], [place]. However they're probably going to be moving to [place]. 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 HTN. 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 Hiatal hernia on EGD ~ 2011. Diverticulosis on colonoscopy ~ 2011. GI bleed with hematochezia in 2015, wife not sure what the source was; did not undergo repeat endoscopic evaluation then.   Pt has had painless hematochezia daily with AM BM beginning 8/29. Wife observed weakness starting 8/31 but had to cajole pt to come to hospital. Pt denies dizziness, chest pain, abd pain. No nausea. Generally great appetite but anorexic starting yesterday.   Tonight:  PCCM called to bedside, patient was unresponsive and in shock.  He was immediately transferred to the ICU for Hemmorhagic shock, and my own history/data gathering from his wife was deferred to stabilize him.  SUBJECTIVE: No events overnight.  VITAL SIGNS: Temp:  [97.6 F (36.4 C)-99.8 F (37.7 C)] 99.4 F (37.4 C) (09/04 0740) Pulse Rate:  [101-143] 127 (09/04 0719) Resp:  [15-26] 20 (09/04 0719) BP: (77-151)/(59-97) 99/67 mmHg (09/04 0719) SpO2:  [99 %-100 %] 100 % (09/04 0719) FiO2 (%):  [35 %-100 %] 35 % (09/04 0719)   HEMODYNAMICS: CVP:  [12 mmHg-25 mmHg] 23 mmHg   VENTILATOR SETTINGS: Vent Mode:  [-] PRVC FiO2 (%):  [35 %-100 %] 35 % Set Rate:  [20 bmp-25 bmp] 20 bmp Vt Set:  [550 mL-620 mL] 550 mL PEEP:  [5 cmH20] 5 cmH20 Plateau Pressure:  [15 cmH20-18 cmH20] 15 cmH20   INTAKE / OUTPUT:  Intake/Output Summary (Last 24 hours) at  03/30/15 0840 Last data filed at 03/30/15 0500  Gross per 24 hour  Intake 5227.92 ml  Output    790 ml  Net 4437.92 ml   PHYSICAL EXAMINATION: General:  Obtunded Neuro:  Obtunded, GCS6 HEENT:  Dry MM Cardiovascular:  Tachycardic, no m/r/g Lungs:  CTA b/l no w/r/r Abdomen:  Soft, tender, non distended, normal bowel sound Skin: Cool to touch  LABS:  CBC  Recent Labs Lab 03/28/15 0810  03/29/15 0756  03/29/15 1119 03/29/15 1950 03/30/15 0457  WBC 9.6  --   --   --   --  13.1* 10.4  HGB 8.2*  < > 6.0*  < > 8.2* 9.4* 8.3*  HCT 24.4*  < > 17.5*  < > 24.0* 26.4* 23.5*  PLT 152  --  158  --   --  126* 128*  < > = values in this interval not displayed. Coag's  Recent Labs Lab 03/27/15 1400 03/29/15 0756 03/29/15 1007  APTT 21* 27 32  INR 1.13 1.43 1.54*   BMET  Recent Labs Lab 03/29/15 0757  03/29/15 1119 03/29/15 1950 03/30/15 0457  NA 142  < > 146* 141 144  K 4.0  < > 4.0 3.6 3.6  CL 118*  --   --  117* 119*  CO2 20*  --   --  17* 20*  BUN 16  --   --  12 12  CREATININE 1.11  --   --  1.18 1.25*  GLUCOSE 203*  --   --  156* 144*  < > = values in this interval not displayed. Electrolytes  Recent Labs Lab 03/29/15 0757 03/29/15 1950 03/30/15 0457  CALCIUM 7.7* 7.2* 7.6*  MG  --   --  1.3*  PHOS  --   --  1.6*   Sepsis Markers  Recent Labs Lab 03/29/15 0800 03/29/15 1625  LATICACIDVEN 2.3* 3.1*   ABG  Recent Labs Lab 03/29/15 1119 03/29/15 1603 03/30/15 0400  PHART 7.272* 7.420 7.559*  PCO2ART 44.0 27.8* 21.0*  PO2ART 459.0* 245.0* 104*   Liver Enzymes  Recent Labs Lab 03/27/15 1400 03/28/15 0810  AST 29 22  ALT 32 21  ALKPHOS 51 34*  BILITOT 0.7 0.9  ALBUMIN 3.4* 2.3*   Cardiac Enzymes No results for input(s): TROPONINI, PROBNP in the last 168 hours. Glucose  Recent Labs Lab 03/29/15 1310 03/29/15 1618 03/29/15 2005 03/29/15 2354 03/30/15 0405 03/30/15 0737  GLUCAP 183* 157* 142* 160* 138* 118*   Imaging Dg  Chest Port 1 View  03/30/2015   CLINICAL DATA:  Cardiac arrest.  EXAM: PORTABLE CHEST - 1 VIEW  COMPARISON:  03/29/2015 and prior exams  FINDINGS: Endotracheal tube tip 7 cm above the carina, left IJ central venous catheter with tip overlying the lower SVC, NG tube entering the stomach with tip off the field of view and right IJ central venous catheter sheath noted.  Right basilar atelectasis/airspace disease has slightly increased.  Mild left basilar atelectasis has improved.  There is no evidence of pneumothorax.  IMPRESSION: Slightly increased right basilar atelectasis/ airspace disease and slightly improved left basilar atelectasis.  Interval placement of NG tube entering the stomach.  No other significant change.   Electronically Signed   By: Margarette Canada M.D.   On: 03/30/2015 07:19   ASSESSMENT / PLAN:  74 yo male with acute lower GIB likely diverticular, with hemmorhagic shock.  PULMONARY A: Intubated for airway protection due to shock and low GCS.  P:    - Begin PS trials, if mental status improves then will consider SBT.  - ABG noted and vent adjusted.  - Sedation as ordered, PRN.  CARDIOVASCULAR A: Acute hemmorhagic shock.  S/p 8 UPRBC since admission 3 in last 24hrs. P:   - Right IJ cortis/introducer placed 9/3>>>  - Left IJ central line placed 9/3>>>  - R radial a-line 9/3>>>  - Massive transfusion protocol activated, will d/c  - 2g Calcium gluconate IV given  - Levophed and fentanyl PRN.  GASTROINTESTINAL A:  Acute lower GIB suspect diverticular, currently no e/o Upper source.  No history  P:    - GI consulted and EGD is negative.  - Surgery consulted total colectomy complete.  - Further treatment as above.  HEMATOLOGIC A:  Hemmorhagic shock.  Etiology as above. P:   - Tx as above  - Given total of 8 units of pRBC, 4 of FFP and 3 of platelets.  FAMILY  - Updates:  No family bedside.  The patient is critically ill with multiple organ systems failure and requires high  complexity decision making for assessment and support, frequent evaluation and titration of therapies, application of advanced monitoring technologies and extensive interpretation of multiple databases.   Critical Care Time devoted to patient care services described in this note is  35  Minutes. This time reflects time of care of this signee Dr [name]. This critical care time does not reflect procedure time, or teaching time or supervisory time of PA/NP/Med student/Med Resident etc but could involve  care discussion time.  Rush Farmer, M.D. [facility] Pulmonary/Critical Care Medicine. Pager: [phone]. After hours pager: [phone].  03/30/2015, 8:40 AM

## 2015-03-30 NOTE — Procedures (Signed)
Intubation Procedure Note Jesse [name] [id number] 11/01/1940  Procedure: Intubation Indications: Respiratory insufficiency  Procedure Details Consent: Unable to obtain consent because of emergent medical necessity. Time Out: Verified patient identification, verified procedure, site/side was marked, verified correct patient position, special equipment/implants available, medications/allergies/relevent history reviewed, required imaging and test results available.  Performed  Maximum sterile technique was used including gloves, hand hygiene and mask.  MAC    Evaluation Hemodynamic Status: BP stable throughout; O2 sats: stable throughout Patient's Current Condition: stable Complications: No apparent complications Patient did tolerate procedure well. Chest X-ray ordered to verify placement.  CXR: pending.   [name] 03/30/2015

## 2015-03-30 NOTE — Progress Notes (Signed)
Called [facility] to notify them of patient's increasing agitation and pressor needs with added sedation.  Advised to keep titrating up the neo until maxed out, then add levo if needed.

## 2015-03-30 NOTE — Progress Notes (Addendum)
Spoke with Dr. Oletta Darter about patient's blood pressure not responding to the increasing neo gtt.  Orders given for 1L NS bolus which is infusing now, and for CVPs.  Patient's CVP is 10 after the 1L bolus, and his pressure has responded well (117/62 MAP 79) and am currently titrating the neo gtt down.

## 2015-03-30 NOTE — Progress Notes (Signed)
Patient ID: Jesse [name], male   DOB: [birth year], 74 y.o.   MRN: [id number] 1 Day Post-Op  Subjective: On vent, responds to pain stimuli.  No evidence of further bleeding  Objective: Vital signs in last 24 hours: Temp:  [97.6 F (36.4 C)-99.8 F (37.7 C)] 99.4 F (37.4 C) (09/04 0740) Pulse Rate:  [101-161] 127 (09/04 0719) Resp:  [15-27] 20 (09/04 0719) BP: (77-151)/(59-97) 99/67 mmHg (09/04 0719) SpO2:  [99 %-100 %] 100 % (09/04 0719) FiO2 (%):  [35 %-100 %] 35 % (09/04 0719) Last BM Date: 03/29/15  Intake/Output from previous day: 09/03 0701 - 09/04 0700 In: 8532.9 [I.V.:2840.9; DPOEU:2353; IV IRWERXVQM:086] Out: 790 [Urine:640; Blood:150] Intake/Output this shift:    PE: Abd: soft, appropriately tender at he grimaces and responds while on vent, ileostomy with viable pink stoma.  Just sweat output.  No bleeding.  Midline incision is c/d/i with staples, some old drainage noted. Heart: tachy Lungs: vent sounds, nonlabored  Lab Results:   Recent Labs  03/29/15 1950 03/30/15 0457  WBC 13.1* 10.4  HGB 9.4* 8.3*  HCT 26.4* 23.5*  PLT 126* 128*   BMET  Recent Labs  03/29/15 1950 03/30/15 0457  NA 141 144  K 3.6 3.6  CL 117* 119*  CO2 17* 20*  GLUCOSE 156* 144*  BUN 12 12  CREATININE 1.18 1.25*  CALCIUM 7.2* 7.6*   PT/INR  Recent Labs  03/29/15 0756 03/29/15 1007  LABPROT 17.5* 18.6*  INR 1.43 1.54*   CMP     Component Value Date/Time   NA 144 03/30/2015 0457   K 3.6 03/30/2015 0457   CL 119* 03/30/2015 0457   CO2 20* 03/30/2015 0457   GLUCOSE 144* 03/30/2015 0457   BUN 12 03/30/2015 0457   CREATININE 1.25* 03/30/2015 0457   CALCIUM 7.6* 03/30/2015 0457   PROT 4.4* 03/28/2015 0810   ALBUMIN 2.3* 03/28/2015 0810   AST 22 03/28/2015 0810   ALT 21 03/28/2015 0810   ALKPHOS 34* 03/28/2015 0810   BILITOT 0.9 03/28/2015 0810   GFRNONAA 56* 03/30/2015 0457   GFRAA >60 03/30/2015 0457   Lipase  No results found for:  LIPASE     Studies/Results: Dg Chest Port 1 View  03/30/2015   CLINICAL DATA:  Cardiac arrest.  EXAM: PORTABLE CHEST - 1 VIEW  COMPARISON:  03/29/2015 and prior exams  FINDINGS: Endotracheal tube tip 7 cm above the carina, left IJ central venous catheter with tip overlying the lower SVC, NG tube entering the stomach with tip off the field of view and right IJ central venous catheter sheath noted.  Right basilar atelectasis/airspace disease has slightly increased.  Mild left basilar atelectasis has improved.  There is no evidence of pneumothorax.  IMPRESSION: Slightly increased right basilar atelectasis/ airspace disease and slightly improved left basilar atelectasis.  Interval placement of NG tube entering the stomach.  No other significant change.   Electronically Signed   By: Margarette Canada M.D.   On: 03/30/2015 07:19   Dg Chest Port 1 View  03/29/2015   CLINICAL DATA:  Cardiac arrest  EXAM: PORTABLE CHEST - 1 VIEW  COMPARISON:  03/29/2015 at 7:  17  FINDINGS: Endotracheal tube and LEFT central venous line unchanged. Pad artifact overlies central chest. Normal cardiac silhouette. There is LEFT basilar atelectasis. No pneumothorax.  IMPRESSION: Mild increased LEFT basilar atelectasis.  Endotracheal tube unchanged.  No pneumothorax.   Electronically Signed   By: Suzy Bouchard M.D.   On: 03/29/2015 08:41  Dg Chest Port 1 View  03/29/2015   CLINICAL DATA:  intubation central line placement.  EXAM: PORTABLE CHEST - 1 VIEW  COMPARISON:  None.  FINDINGS: Endotracheal tube 7 cm from carina. Normal cardiac silhouette. No effusion, infiltrate, pneumothorax. RIGHT IJ sheath.  IMPRESSION: Endotracheal tube 7 cm from carina.   Electronically Signed   By: Suzy Bouchard M.D.   On: 03/29/2015 07:46   Dg Chest Port 1 View  03/29/2015   CLINICAL DATA:  74 year old male status post central line placement.  EXAM: PORTABLE CHEST - 1 VIEW  COMPARISON:  Chest x-ray a 03/29/2015.  FINDINGS: New left internal jugular  central venous catheter with tip terminating at the superior cavoatrial junction. Right IJ central venous Cordis with tip in the internal jugular vein. An endotracheal tube is in place with tip 7.1 cm above the carina. Lung volumes are low. Bibasilar opacities (left greater than right), most compatible subsegmental atelectasis. No consolidative airspace disease. No pneumothorax. No pleural effusions. No evidence of pulmonary edema. Heart size and mediastinal contours are within normal limits.  IMPRESSION: 1. Support apparatus, as above. New left internal jugular central venous catheter tip terminates at the superior cavoatrial junction. 2. No pneumothorax or other complicating features. 3. Low lung volumes with bibasilar subsegmental atelectasis.   Electronically Signed   By: Vinnie Langton M.D.   On: 03/29/2015 07:33    Anti-infectives: Anti-infectives    Start     Dose/Rate Route Frequency Ordered Stop   03/29/15 1000  cefoTEtan (CEFOTAN) 2 g in dextrose 5 % 50 mL IVPB     2 g 100 mL/hr over 30 Minutes Intravenous To Surgery 03/29/15 0957 03/29/15 1012       Assessment/Plan  POD 1, s/p subtotal abdominal colectomy for massive LGI bleed - cont NGT while on vent -would hold off on feedings for now, but hopefully can start TFs soon if still on vent -WOC consult for routine new ileostomy care -no abx therapy needed from our standpoint DVT prophylaxis -SCDs/Lovenox  -defer other care to primary service  LOS: 3 days    Jemina Scahill E 03/30/2015, 7:52 AM Pager: [phone]

## 2015-03-30 NOTE — Procedures (Signed)
Extubation Procedure Note  Patient Details:   Name: Jesse [name] DOB: [birth year] MRN: [id number]   Airway Documentation:  Airway 7.5 mm (Active)  Secured at (cm) 23 cm 03/30/2015  7:19 AM  Measured From Lips 03/30/2015  7:19 AM  [place] 03/30/2015  7:19 AM  Secured By [employer] 03/30/2015  7:19 AM  Tube Holder Repositioned Yes 03/30/2015  7:19 AM  Cuff Pressure (cm H2O) 24 cm H2O 03/29/2015  7:12 PM  Site Condition Dry 03/30/2015  7:19 AM    Evaluation  O2 sats: stable throughout Complications: No apparent complications Patient did tolerate procedure well. Bilateral Breath Sounds: Clear Suctioning: Airway Yes   Positive cuff leak noted before extubation. Patient able to speak. Placed on nasal cannula 5 Lpm with humidity. Sat 94%.  Mingo Amber Malikai Gut 03/30/2015, 12:03 PM

## 2015-03-30 NOTE — Consult Note (Signed)
WOC ostomy consult note Stoma type/location:  RLQ, end ileostomy 03/29/15 Stomal assessment/size: 1 1/4"round, budded from skin, pink, moist Peristomal assessment: pouch intact from OR Treatment options for stomal/peristomal skin: did not remove pouch today Output bloody, small amount Ostomy pouching: 2pc. 2 3/4" in place from OR, new supplies ordered for use Education provided: none, patient intubated and sedated. Wife at home "sick" per bedside nurse Will begin teaching with patient and family once patient awake and alert.  Enrolled patient in [place] program: No   WOC will follow along with you for ostomy care and teaching.  [place]in[place], [place]

## 2015-03-30 NOTE — Progress Notes (Signed)
eLink Physician-Brief Progress Note Patient Name: Jesse [name] DOB: 1941/01/08 MRN: [id number]   Date of Service  03/30/2015  HPI/Events of Note  resp alkalosis  eICU Interventions  Drop RR to 20 Drop Tv to 7cc/kg = 550 cc     Intervention Category Major Interventions: Acid-Base disturbance - evaluation and management  ALVA,RAKESH V. 03/30/2015, 4:43 AM

## 2015-03-30 NOTE — Progress Notes (Signed)
RT attempted to instructed patient on incentive spirometry. Patient was too drowsy. RT will continue to monitor.

## 2015-03-30 NOTE — Progress Notes (Signed)
[facility] Progress Note Patient Name: Jesse [name] DOB: [birth year] MRN: [id number]   Date of Service  03/30/2015  HPI/Events of Note  Hypotension while on Fentanyl and Precedex IV infusions.   eICU Interventions  Will order: 1. Monitor CVP Q 4 hours. 2. 0.9 NaCl 1 liter IV over 1 hour now.      Intervention Category Major Interventions: Hypotension - evaluation and management  Gregg Holster [place] 03/30/2015, 9:00 PM

## 2015-03-31 ENCOUNTER — Inpatient Hospital Stay (HOSPITAL_COMMUNITY): Payer: Medicare (Managed Care)

## 2015-03-31 LAB — URINALYSIS, ROUTINE W REFLEX MICROSCOPIC
Bilirubin Urine: NEGATIVE
GLUCOSE, UA: NEGATIVE mg/dL
KETONES UR: NEGATIVE mg/dL
LEUKOCYTES UA: NEGATIVE
Nitrite: NEGATIVE
PH: 5.5 (ref 5.0–8.0)
Protein, ur: 30 mg/dL — AB
Specific Gravity, Urine: 1.021 (ref 1.005–1.030)
Urobilinogen, UA: 0.2 mg/dL (ref 0.0–1.0)

## 2015-03-31 LAB — TYPE AND SCREEN
ABO/RH(D): O POS
Antibody Screen: NEGATIVE
UNIT DIVISION: 0
UNIT DIVISION: 0
UNIT DIVISION: 0
UNIT DIVISION: 0
UNIT DIVISION: 0
UNIT DIVISION: 0
Unit division: 0
Unit division: 0
Unit division: 0
Unit division: 0
Unit division: 0
Unit division: 0
Unit division: 0
Unit division: 0
Unit division: 0
Unit division: 0
Unit division: 0

## 2015-03-31 LAB — CBC
HCT: 23.7 % — ABNORMAL LOW (ref 39.0–52.0)
HEMATOCRIT: 20.7 % — AB (ref 39.0–52.0)
Hemoglobin: 6.8 g/dL — CL (ref 13.0–17.0)
Hemoglobin: 7.9 g/dL — ABNORMAL LOW (ref 13.0–17.0)
MCH: 29.7 pg (ref 26.0–34.0)
MCH: 30.5 pg (ref 26.0–34.0)
MCHC: 32.9 g/dL (ref 30.0–36.0)
MCHC: 33.3 g/dL (ref 30.0–36.0)
MCV: 90.4 fL (ref 78.0–100.0)
MCV: 91.5 fL (ref 78.0–100.0)
PLATELETS: 146 10*3/uL — AB (ref 150–400)
Platelets: 153 10*3/uL (ref 150–400)
RBC: 2.29 MIL/uL — ABNORMAL LOW (ref 4.22–5.81)
RBC: 2.59 MIL/uL — AB (ref 4.22–5.81)
RDW: 16.9 % — AB (ref 11.5–15.5)
RDW: 16.4 % — AB (ref 11.5–15.5)
WBC: 8.7 10*3/uL (ref 4.0–10.5)
WBC: 7.8 10*3/uL (ref 4.0–10.5)

## 2015-03-31 LAB — URINE MICROSCOPIC-ADD ON

## 2015-03-31 LAB — BASIC METABOLIC PANEL
ANION GAP: 3 — AB (ref 5–15)
BUN: 10 mg/dL (ref 6–20)
CALCIUM: 7.4 mg/dL — AB (ref 8.9–10.3)
CO2: 22 mmol/L (ref 22–32)
Chloride: 117 mmol/L — ABNORMAL HIGH (ref 101–111)
Creatinine, Ser: 1.1 mg/dL (ref 0.61–1.24)
GFR calc Af Amer: >60 mL/min (ref >60)
GFR calc non Af Amer: >60 mL/min (ref >60)
GLUCOSE: 131 mg/dL — AB (ref 65–99)
Potassium: 3.7 mmol/L (ref 3.5–5.1)
Sodium: 142 mmol/L (ref 135–145)

## 2015-03-31 LAB — GLUCOSE, CAPILLARY
GLUCOSE-CAPILLARY: 125 mg/dL — AB (ref 65–99)
GLUCOSE-CAPILLARY: 114 mg/dL — AB (ref 65–99)
GLUCOSE-CAPILLARY: 121 mg/dL — AB (ref 65–99)
Glucose-Capillary: 108 mg/dL — ABNORMAL HIGH (ref 65–99)
Glucose-Capillary: 125 mg/dL — ABNORMAL HIGH (ref 65–99)
Glucose-Capillary: 129 mg/dL — ABNORMAL HIGH (ref 65–99)
Glucose-Capillary: 133 mg/dL — ABNORMAL HIGH (ref 65–99)

## 2015-03-31 LAB — BLOOD GAS, ARTERIAL
ACID-BASE DEFICIT: 2.4 mmol/L — AB (ref 0.0–2.0)
BICARBONATE: 21.2 meq/L (ref 20.0–24.0)
DRAWN BY: 419771
FIO2: 0.6
LHR: 20 {breaths}/min
O2 SAT: 99.2 %
PCO2 ART: 32.8 mmHg — AB (ref 35.0–45.0)
PEEP: 5 cmH2O
PH ART: 7.428 (ref 7.350–7.450)
Patient temperature: 98.6
TCO2: 22.3 mmol/L (ref 0–100)
VT: 550 mL
pO2, Arterial: 142 mmHg — ABNORMAL HIGH (ref 80.0–100.0)

## 2015-03-31 LAB — MAGNESIUM: Magnesium: 1.8 mg/dL (ref 1.7–2.4)

## 2015-03-31 LAB — PHOSPHORUS: Phosphorus: 1.8 mg/dL — ABNORMAL LOW (ref 2.5–4.6)

## 2015-03-31 LAB — PREPARE RBC (CROSSMATCH)

## 2015-03-31 MED ORDER — SODIUM CHLORIDE 0.9 % IV SOLN
INTRAVENOUS | Status: DC
  Administered 2015-03-31: 12:00:00 via INTRAVENOUS

## 2015-03-31 MED ORDER — VANCOMYCIN HCL IN DEXTROSE 750-5 MG/150ML-% IV SOLN
750.0000 mg | Freq: Two times a day (BID) | INTRAVENOUS | Status: DC
  Administered 2015-03-31: 750 mg via INTRAVENOUS
  Filled 2015-03-31 (×3): qty 150

## 2015-03-31 MED ORDER — PIPERACILLIN-TAZOBACTAM 3.375 G IVPB
3.3750 g | Freq: Three times a day (TID) | INTRAVENOUS | Status: DC
  Administered 2015-03-31 – 2015-04-02 (×6): 3.375 g via INTRAVENOUS
  Filled 2015-03-31 (×8): qty 50

## 2015-03-31 MED ORDER — DEXTROSE 5 % IV SOLN
20.0000 mmol | Freq: Once | INTRAVENOUS | Status: DC
  Administered 2015-03-31: 20 mmol via INTRAVENOUS
  Filled 2015-03-31: qty 6.67

## 2015-03-31 MED ORDER — POTASSIUM PHOSPHATES 15 MMOLE/5ML IV SOLN
30.0000 mmol | Freq: Once | INTRAVENOUS | Status: DC
  Filled 2015-03-31: qty 10

## 2015-03-31 MED ORDER — MAGNESIUM SULFATE 2 GM/50ML IV SOLN
2.0000 g | Freq: Once | INTRAVENOUS | Status: AC
  Administered 2015-03-31: 2 g via INTRAVENOUS
  Filled 2015-03-31: qty 50

## 2015-03-31 MED ORDER — VITAL HIGH PROTEIN PO LIQD
1000.0000 mL | ORAL | Status: DC
  Administered 2015-03-31: 1000 mL
  Filled 2015-03-31 (×3): qty 1000

## 2015-03-31 MED ORDER — VANCOMYCIN HCL 10 G IV SOLR
2000.0000 mg | Freq: Once | INTRAVENOUS | Status: AC
  Administered 2015-03-31: 2000 mg via INTRAVENOUS
  Filled 2015-03-31: qty 2000

## 2015-03-31 NOTE — Progress Notes (Signed)
RT attempted CPAP/PS wean. Patient was apneic. Placed back on full support.

## 2015-03-31 NOTE — Progress Notes (Signed)
Notified E-link nurse to notify Dr. Lamonte Sakai of patient's phosphorus (1.8).

## 2015-03-31 NOTE — Progress Notes (Signed)
CRITICAL VALUE ALERT  Critical value received:  Hemoglobin 6.8  Date of notification:  03/31/15  Time of notification:  5:20  Critical value read back:Yes.    Nurse who received alert:  Norva Pavlov, RN  MD notified (1st page):  [place] nurse to notify Dr. Lamonte Sakai  Time of first page:  5:25  MD notified (2nd page):  Time of second page:  Responding MD:  Dr. Lamonte Sakai  Time MD responded:  5:25

## 2015-03-31 NOTE — Progress Notes (Signed)
eLink Physician-Brief Progress Note Patient Name: Jesse [name] DOB: [birth year] MRN: [id number]   Date of Service  03/31/2015  HPI/Events of Note    eICU Interventions  Phos replacement ordered     Intervention Category Intermediate Interventions: Electrolyte abnormality - evaluation and management  Corwin Kuiken S. 03/31/2015, 5:43 AM

## 2015-03-31 NOTE — Progress Notes (Signed)
Spoke with Dr. Lamonte Sakai about patient's change in vitals (elevation of  BP 180/65 MAP 94 off off a-line), HR 156, O2 sat 100 on FIO2 60.  Neo has been paused.  Dr. Lamonte Sakai suggested to go up on the fentanyl in efforts to make the patient more comfortable.  Patient's fentanyl increased to 250 at this time.  Will continue to monitor and assess.

## 2015-03-31 NOTE — Progress Notes (Signed)
Nutrition Follow-up / Consult  DOCUMENTATION CODES:   Obesity unspecified  INTERVENTION:    Initiate TF via NGT with Vital High Protein at 10 ml/h.  When able to advance TF, increase by 10 ml every 6-8 hours to goal rate of 40 ml/h with Prostat 30 ml 5 times per day to provide 1460 kcals, 159 gm protein, 803 ml free water daily.  NUTRITION DIAGNOSIS:   Inadequate oral intake related to inability to eat as evidenced by NPO status.  GOAL:   Provide needs based on ASPEN/SCCM guidelines  MONITOR:   Vent status, TF tolerance, Labs, Weight trends  REASON FOR ASSESSMENT:   Consult Enteral/tube feeding initiation and management  ASSESSMENT:   Patient admitted on 9/1 with acute lower GI bleed with hemorrhagic shock.  S/P subtotal abdominal colectomy for massive LGI bleed on 9/3. Now with RLQ end ileostomy. Patient was extubated on 9/4, but required re-intubation the same afternoon. Plans to start trickle TF via NGT today, no advancement at this time.  Patient is currently intubated on ventilator support Temp (24hrs), Avg:100.6 F (38.1 C), Min:98.1 F (36.7 C), Max:103 F (39.4 C)  Labs reviewed: phosphorus low at 1.8.  Diet Order:  Diet NPO time specified  Skin:  Reviewed, no issues  Last BM:  9/3 (ileostomy)  Height:   Ht Readings from Last 1 Encounters:  03/27/15 5\' 11"  (1.803 m)    Weight:   Wt Readings from Last 1 Encounters:  03/27/15 222 lb 14.2 oz (101.1 kg)    Ideal Body Weight:  78.18 kg  BMI:  Body mass index is 31.1 kg/(m^2).  Estimated Nutritional Needs:   Kcal:  5852-7782  Protein:  156 gm  Fluid:  2 L  EDUCATION NEEDS:   No education needs identified at this time  Molli Barrows, [place], [place], [place] Pager [phone] After Hours Pager [phone]

## 2015-03-31 NOTE — Progress Notes (Signed)
   Nurse called to say patient was voiding around the catheter. Otherwise catheter seemed to be draining well. Dr. Matilde Sprang performed cystoscopy and an 67 French coud catheter placement while patient was in the OR.  Physical exam: Patient sedated and intubated. Foley catheter in place. Urine clear in the tubing. The Foley catheter was mobile and the balloon was obviously in the bladder. After I move the Foley he began draining a few 100 mL of urine and voided around the catheter.  Foley catheter was irrigated and irrigated easily with equal return.   Assessment/plan: Patient intubated and sedated. The Foley catheter may have been obstructed by some debris but it is in good position and irrigates normally. It does not need to be changed at this point. Please reconsult with any questions.

## 2015-03-31 NOTE — Progress Notes (Signed)
[facility] Progress Note Patient Name: Jesse [name] DOB: [birth year] MRN: [id number]   Date of Service  03/31/2015  HPI/Events of Note  Hgb 6.8  eICU Interventions  1u PRBC given     Intervention Category Intermediate Interventions: Other:  [name] S. 03/31/2015, 5:28 AM

## 2015-03-31 NOTE — Consult Note (Signed)
WOC ostomy follow up Stoma type/location:  RLQ, end ileostomy Output just some sanguinous drainage.  Ostomy pouching: 2pc. 2 3/4" in place  Education provided: Pt intubated and sedated.  Met wife and explained role of the [place] nurse.  Provided her with education materials at the bedside today, she will keep them with her.   Enrolled patient in [place] Discharge program:No  Supplies at the bedside. Pouch intact with little output.  Will follow along with nursing staff and continue to work with wife if needed and patient once and if he is extubated.  Painter, [place]

## 2015-03-31 NOTE — Progress Notes (Signed)
Patient ID: Jesse [name], male   DOB: [birth year], 74 y.o.   MRN: [id number] 2 Days Post-Op  Subjective: Pt intubated.  Extubated yesterday and quickly reintubated.  On levo and neo  Objective: Vital signs in last 24 hours: Temp:  [97.5 F (36.4 C)-102.8 F (39.3 C)] 102.8 F (39.3 C) (09/05 0729) Pulse Rate:  [45-164] 101 (09/05 0721) Resp:  [17-30] 20 (09/05 0721) BP: (75-158)/(53-95) 95/59 mmHg (09/05 0721) SpO2:  [72 %-100 %] 100 % (09/05 0721) Arterial Line BP: (78-205)/(43-83) 125/64 mmHg (09/05 0700) FiO2 (%):  [50 %-60 %] 50 % (09/05 0721) Last BM Date: 03/29/15  Intake/Output from previous day: 09/04 0701 - 09/05 0700 In: 2267.9 [I.V.:2041.7; NG/GT:90; IV Piggyback:136.2] Out: 1761 [Urine:1585; Emesis/NG output:80; Stool:90] Intake/Output this shift: Total I/O In: 355.2 [I.V.:189; NG/GT:30; IV Piggyback:136.2] Out: -   PE: Abd: soft, few BS, obese, midline wound is intact with staples, ostomy has some sweat present.  Stoma is pink and viable.  Minimal NGT output  Lab Results:   Recent Labs  03/31/15 0443 03/31/15 0830  WBC 8.7 7.8  HGB 6.8* 7.9*  HCT 20.7* 23.7*  PLT 153 146*   BMET  Recent Labs  03/30/15 0457 03/31/15 0443  NA 144 142  K 3.6 3.7  CL 119* 117*  CO2 20* 22  GLUCOSE 144* 131*  BUN 12 10  CREATININE 1.25* 1.10  CALCIUM 7.6* 7.4*   PT/INR  Recent Labs  03/29/15 0756 03/29/15 1007  LABPROT 17.5* 18.6*  INR 1.43 1.54*   CMP     Component Value Date/Time   NA 142 03/31/2015 0443   K 3.7 03/31/2015 0443   CL 117* 03/31/2015 0443   CO2 22 03/31/2015 0443   GLUCOSE 131* 03/31/2015 0443   BUN 10 03/31/2015 0443   CREATININE 1.10 03/31/2015 0443   CALCIUM 7.4* 03/31/2015 0443   PROT 4.4* 03/28/2015 0810   ALBUMIN 2.3* 03/28/2015 0810   AST 22 03/28/2015 0810   ALT 21 03/28/2015 0810   ALKPHOS 34* 03/28/2015 0810   BILITOT 0.9 03/28/2015 0810   GFRNONAA >60 03/31/2015 0443   GFRAA >60 03/31/2015 0443   Lipase  No results  found for: LIPASE     Studies/Results: Dg Chest Port 1 View  03/31/2015   CLINICAL DATA:  74 year old male with acute GI bleeding, intubated.  EXAM: PORTABLE CHEST - 1 VIEW  COMPARISON:  03/30/2015 and earlier.  FINDINGS: Portable AP semi upright view at 0535 hours. Stable endotracheal tube tip at the level the clavicles. Enteric tube courses to the left upper quadrant, tip not included. Stable left IJ central line. Increased veiling opacity right greater than left with increased lower lobe opacity and obscuration of the diaphragm. Stable cardiac size and mediastinal contours. Upper lobes are stable. No pneumothorax or pulmonary edema.  IMPRESSION: 1.  Stable lines and tubes. 2. Progressed bilateral pleural effusions and lower lobe collapse or consolidation.   Electronically Signed   By: Genevie Ann M.D.   On: 03/31/2015 07:49   Dg Chest Port 1 View  03/30/2015   CLINICAL DATA:  Extubated and than the reading intubated.  EXAM: PORTABLE CHEST - 1 VIEW  COMPARISON:  Earlier today.  FINDINGS: Endotracheal tube in satisfactory position. Nasogastric tube extending into the stomach. Increased left basilar airspace opacity. Interval mild right mid lung zone airspace opacity with no significant change in right lower lung zone airspace opacity. Small left pleural effusion. Stable left jugular catheter. Mild scoliosis.  IMPRESSION: 1. Increased left basilar  atelectasis or pneumonia. 2. Small left pleural effusion. 3. Interval mild right mid lung zone probable pneumonia. 4. No significant change in right basilar atelectasis and possible pneumonia.   Electronically Signed   By: Claudie Revering M.D.   On: 03/30/2015 16:14   Dg Chest Port 1 View  03/30/2015   CLINICAL DATA:  Cardiac arrest.  EXAM: PORTABLE CHEST - 1 VIEW  COMPARISON:  03/29/2015 and prior exams  FINDINGS: Endotracheal tube tip 7 cm above the carina, left IJ central venous catheter with tip overlying the lower SVC, NG tube entering the stomach with tip off the  field of view and right IJ central venous catheter sheath noted.  Right basilar atelectasis/airspace disease has slightly increased.  Mild left basilar atelectasis has improved.  There is no evidence of pneumothorax.  IMPRESSION: Slightly increased right basilar atelectasis/ airspace disease and slightly improved left basilar atelectasis.  Interval placement of NG tube entering the stomach.  No other significant change.   Electronically Signed   By: Margarette Canada M.D.   On: 03/30/2015 07:19    Anti-infectives: Anti-infectives    Start     Dose/Rate Route Frequency Ordered Stop   03/31/15 2200  vancomycin (VANCOCIN) IVPB 750 mg/150 ml premix     750 mg 150 mL/hr over 60 Minutes Intravenous Every 12 hours 03/31/15 0929     03/31/15 1000  vancomycin (VANCOCIN) 2,000 mg in sodium chloride 0.9 % 500 mL IVPB     2,000 mg 250 mL/hr over 120 Minutes Intravenous  Once 03/31/15 0927     03/31/15 1000  piperacillin-tazobactam (ZOSYN) IVPB 3.375 g     3.375 g 12.5 mL/hr over 240 Minutes Intravenous Every 8 hours 03/31/15 0927     03/29/15 1000  cefoTEtan (CEFOTAN) 2 g in dextrose 5 % 50 mL IVPB     2 g 100 mL/hr over 30 Minutes Intravenous To Surgery 03/29/15 0957 03/29/15 1012       Assessment/Plan   POD 2, s/p subtotal abdominal colectomy for massive LGI bleed - cont NGT while on vent, but will stop suction and initiate trickle TFs.  While on so much pressor support, don't want to stress his gut too much -WOC consult for routine new ileostomy care -no abx therapy needed from our standpoint DVT prophylaxis -SCDs/Lovenox  LOS: 4 days    Dovey Fatzinger E 03/31/2015, 9:36 AM Pager: [phone]

## 2015-03-31 NOTE — Progress Notes (Signed)
Dr Nelda Marseille notified of pt's temp 103.1; orders received for [facility] x 2, UA, and urine culture.  Will continue to monitor patient.  Lenor Coffin, RN

## 2015-03-31 NOTE — Progress Notes (Signed)
ANTIBIOTIC CONSULT NOTE - INITIAL  Pharmacy Consult for Vancomycin, Zosyn  Indication: rule out sepsis  No Known Allergies  Patient Measurements: Height: 5\' 11"  (180.3 cm) Weight: 222 lb 14.2 oz (101.1 kg) IBW/kg (Calculated) : 75.3  Vital Signs: Temp: 102.8 F (39.3 C) (09/05 0729) Temp Source: Oral (09/05 0729) BP: 95/59 mmHg (09/05 0721) Pulse Rate: 101 (09/05 0721) Intake/Output from previous day: 09/04 0701 - 09/05 0700 In: 2267.9 [I.V.:2041.7; NG/GT:90; IV Piggyback:136.2] Out: 5573 [Urine:1585; Emesis/NG output:80; Stool:90] Intake/Output from this shift: Total I/O In: 136.2 [I.V.:50; IV Piggyback:86.2] Out: -   Labs:  Recent Labs  03/29/15 1950 03/30/15 0457 03/31/15 0443 03/31/15 0830  WBC 13.1* 10.4 8.7 7.8  HGB 9.4* 8.3* 6.8* 7.9*  PLT 126* 128* 153 146*  CREATININE 1.18 1.25* 1.10  --    Estimated Creatinine Clearance: 73.5 mL/min (by C-G formula based on Cr of 1.1). No results for input(s): VANCOTROUGH, VANCOPEAK, VANCORANDOM, GENTTROUGH, GENTPEAK, GENTRANDOM, TOBRATROUGH, TOBRAPEAK, TOBRARND, AMIKACINPEAK, AMIKACINTROU, AMIKACIN in the last 72 hours.   Microbiology: Recent Results (from the past 720 hour(s))  MRSA PCR Screening     Status: None   Collection Time: 03/27/15  7:21 PM  Result Value Ref Range Status   MRSA by PCR NEGATIVE NEGATIVE Final    Comment:        The GeneXpert MRSA Assay (FDA approved for NASAL specimens only), is one component of a comprehensive MRSA colonization surveillance program. It is not intended to diagnose MRSA infection nor to guide or monitor treatment for MRSA infections.     Medical History: Past Medical History  Diagnosis Date  . Memory loss   . Hypertension   . Diabetes mellitus without complication   . Diverticulosis     Of colon  . Hepatitis C     Virus has never been treated. No history of cirrhosis.  . Prostate cancer ~ 1990s.    Treated with radiation seed implant.  . Hiatal hernia    Does not require meds for reflux symptoms.    Medications:  Prescriptions prior to admission  Medication Sig Dispense Refill Last Dose  . lisinopril (PRINIVIL,ZESTRIL) 20 MG tablet Take 20 mg by mouth daily.   03/26/2015 at Unknown time  . metFORMIN (GLUCOPHAGE) 500 MG tablet Take 500 mg by mouth 2 (two) times daily with a meal.   03/26/2015 at Unknown time   Assessment: 35 YOM who presented due to weakness from a large GI bleed. Underwent urgent colectomy on 03/29/15. Patient remains hypotensive and tachycardic while spiking fevers. WBC remains wnl. Pharmacy consulted to start empiric Vancomycin and Zosyn for r/o Sepsis. SCr down to 1.1. Normalized CrCl ~ 62 mL/min.   9/4 Resp Cx>> 9/5 BCx2>> 9/5 UCx>>   Goal of Therapy:  Vancomycin trough level 15-20 mcg/ml  Plan:  -Give Vancomycin 2 gm IV once followed by Vancomycin 750 mg IV Q 12 hours -Zosyn 3.375 gm IV Q 8 hours -Monitor CBC, renal fx, cultures and clinical progress -[facility] at [place], PharmD., BCPS Clinical Pharmacist Pager [phone]

## 2015-03-31 NOTE — Progress Notes (Signed)
PULMONARY / CRITICAL CARE MEDICINE Transfer note   Name: Jesse [name] MRN: [id number] DOB: [birth year]    ADMISSION DATE:  03/27/2015  CHIEF COMPLAINT:  Acute lower GIB/hemmorhagic shock  INITIAL PRESENTATION:  3 days melena   SIGNIFICANT EVENTS: Progressive hypotension/shock   HISTORY OF PRESENT ILLNESS:   Jesse [name] is a 74 y.o. male. Patient and his wife has been residents of [place], [place]. However they're probably going to be moving to [place]. 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 HTN. 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 Hiatal hernia on EGD ~ 2011. Diverticulosis on colonoscopy ~ 2011. GI bleed with hematochezia in 2015, wife not sure what the source was; did not undergo repeat endoscopic evaluation then.   Pt has had painless hematochezia daily with AM BM beginning 8/29. Wife observed weakness starting 8/31 but had to cajole pt to come to hospital. Pt denies dizziness, chest pain, abd pain. No nausea. Generally great appetite but anorexic starting yesterday.   Tonight:  PCCM called to bedside, patient was unresponsive and in shock.  He was immediately transferred to the ICU for Hemmorhagic shock, and my own history/data gathering from his wife was deferred to stabilize him.  SUBJECTIVE: No events overnight.  VITAL SIGNS: Temp:  [97.5 F (36.4 C)-102.8 F (39.3 C)] 102.8 F (39.3 C) (09/05 0729) Pulse Rate:  [45-164] 101 (09/05 0721) Resp:  [12-30] 20 (09/05 0721) BP: (75-158)/(53-95) 95/59 mmHg (09/05 0721) SpO2:  [72 %-100 %] 100 % (09/05 0721) Arterial Line BP: (78-205)/(43-83) 125/64 mmHg (09/05 0700) FiO2 (%):  [50 %-60 %] 50 % (09/05 0721)   HEMODYNAMICS: CVP:  [1 mmHg-42 mmHg] 10 mmHg   VENTILATOR SETTINGS: Vent Mode:  [-] PRVC FiO2 (%):  [50 %-60 %] 50 % Set Rate:  [20 bmp] 20 bmp Vt Set:  [550 mL] 550 mL PEEP:  [5 cmH20] 5 cmH20 Plateau Pressure:  [16 cmH20-20 cmH20] 20 cmH20   INTAKE /  OUTPUT:  Intake/Output Summary (Last 24 hours) at 03/31/15 0831 Last data filed at 03/31/15 0700  Gross per 24 hour  Intake 2197.93 ml  Output   1580 ml  Net 617.93 ml   PHYSICAL EXAMINATION: General:  Obtunded Neuro:  Obtunded, GCS6 HEENT:  Dry MM Cardiovascular:  Tachycardic, no m/r/g Lungs:  CTA b/l no w/r/r Abdomen:  Soft, tender, non distended, normal bowel sound Skin: Cool to touch  LABS:  CBC  Recent Labs Lab 03/29/15 1950 03/30/15 0457 03/31/15 0443  WBC 13.1* 10.4 8.7  HGB 9.4* 8.3* 6.8*  HCT 26.4* 23.5* 20.7*  PLT 126* 128* 153   Coag's  Recent Labs Lab 03/27/15 1400 03/29/15 0756 03/29/15 1007  APTT 21* 27 32  INR 1.13 1.43 1.54*   BMET  Recent Labs Lab 03/29/15 1950 03/30/15 0457 03/31/15 0443  NA 141 144 142  K 3.6 3.6 3.7  CL 117* 119* 117*  CO2 17* 20* 22  BUN 12 12 10   CREATININE 1.18 1.25* 1.10  GLUCOSE 156* 144* 131*   Electrolytes  Recent Labs Lab 03/29/15 1950 03/30/15 0457 03/31/15 0443  CALCIUM 7.2* 7.6* 7.4*  MG  --  1.3* 1.8  PHOS  --  1.6* 1.8*   Sepsis Markers  Recent Labs Lab 03/29/15 0800 03/29/15 1625  LATICACIDVEN 2.3* 3.1*   ABG  Recent Labs Lab 03/30/15 0400 03/30/15 1727 03/31/15 0359  PHART 7.559* 7.302* 7.428  PCO2ART 21.0* 39.2 32.8*  PO2ART 104* 86.0 142*   Liver Enzymes  Recent Labs Lab 03/27/15 1400 03/28/15 0810  AST 29 22  ALT 32 21  ALKPHOS 51 34*  BILITOT 0.7 0.9  ALBUMIN 3.4* 2.3*   Cardiac Enzymes No results for input(s): TROPONINI, PROBNP in the last 168 hours. Glucose  Recent Labs Lab 03/30/15 1147 03/30/15 1611 03/30/15 2019 03/31/15 0019 03/31/15 0412 03/31/15 0728  GLUCAP 131* 134* 149* 133* 129* 108*   Imaging Dg Chest Port 1 View  03/31/2015   CLINICAL DATA:  74 year old male with acute GI bleeding, intubated.  EXAM: PORTABLE CHEST - 1 VIEW  COMPARISON:  03/30/2015 and earlier.  FINDINGS: Portable AP semi upright view at 0535 hours. Stable endotracheal  tube tip at the level the clavicles. Enteric tube courses to the left upper quadrant, tip not included. Stable left IJ central line. Increased veiling opacity right greater than left with increased lower lobe opacity and obscuration of the diaphragm. Stable cardiac size and mediastinal contours. Upper lobes are stable. No pneumothorax or pulmonary edema.  IMPRESSION: 1.  Stable lines and tubes. 2. Progressed bilateral pleural effusions and lower lobe collapse or consolidation.   Electronically Signed   By: Genevie Ann M.D.   On: 03/31/2015 07:49   Dg Chest Port 1 View  03/30/2015   CLINICAL DATA:  Extubated and than the reading intubated.  EXAM: PORTABLE CHEST - 1 VIEW  COMPARISON:  Earlier today.  FINDINGS: Endotracheal tube in satisfactory position. Nasogastric tube extending into the stomach. Increased left basilar airspace opacity. Interval mild right mid lung zone airspace opacity with no significant change in right lower lung zone airspace opacity. Small left pleural effusion. Stable left jugular catheter. Mild scoliosis.  IMPRESSION: 1. Increased left basilar atelectasis or pneumonia. 2. Small left pleural effusion. 3. Interval mild right mid lung zone probable pneumonia. 4. No significant change in right basilar atelectasis and possible pneumonia.   Electronically Signed   By: Claudie Revering M.D.   On: 03/30/2015 16:14   ASSESSMENT / PLAN:  74 yo male with acute lower GIB likely diverticular, with hemmorhagic shock.  PULMONARY A: Intubated for airway protection due to shock and low GCS.  P:    - PS trials but no extubation given mental status.  - History of dementia, may need to address long term plan with family, none have been present all weekend.  - ABG noted and vent adjusted.  - Sedation as ordered, PRN.  CARDIOVASCULAR A: Acute hemmorhagic shock.  S/p 8 UPRBC since admission 3 in last 24hrs. P:   - Right IJ cortis/introducer placed 9/3>>>9/4  - Left IJ central line placed 9/3>>>  - R  radial a-line 9/3>>>  - Massive transfusion protocol activated, now complete.  - Levophed for MAP of 65 mmHg.  GASTROINTESTINAL A:  Acute lower GIB suspect diverticular, currently no e/o Upper source.  No history  P:    - GI consulted and EGD is negative.  - Surgery consulted total colectomy complete 9/3.  - Further treatment as above.  HEMATOLOGIC A:  Hemmorhagic shock.  Etiology as above. P:   - Tx as above  - Given total of 8 units of pRBC, 4 of FFP and 3 of platelets.  FAMILY  - Updates:  No family bedside.  But given baseline dementia, will need to address long term plan as patient was extubated and had to be reintubated for airway protection, may need either trach/peg or one way extubation.  The patient is critically ill with multiple organ systems failure and requires high complexity  decision making for assessment and support, frequent evaluation and titration of therapies, application of advanced monitoring technologies and extensive interpretation of multiple databases.   Critical Care Time devoted to patient care services described in this note is  35  Minutes. This time reflects time of care of this signee Dr [name]. This critical care time does not reflect procedure time, or teaching time or supervisory time of PA/NP/Med student/Med Resident etc but could involve care discussion time.  Rush Farmer, M.D. [facility] Pulmonary/Critical Care Medicine. Pager: [phone]. After hours pager: [phone].  03/31/2015, 8:31 AM

## 2015-04-01 ENCOUNTER — Encounter (HOSPITAL_COMMUNITY): Payer: Self-pay | Admitting: General Surgery

## 2015-04-01 ENCOUNTER — Inpatient Hospital Stay (HOSPITAL_COMMUNITY): Payer: Medicare (Managed Care)

## 2015-04-01 DIAGNOSIS — J690 Pneumonitis due to inhalation of food and vomit: Secondary | ICD-10-CM | POA: Insufficient documentation

## 2015-04-01 LAB — BASIC METABOLIC PANEL
Anion gap: 3 — ABNORMAL LOW (ref 5–15)
BUN: 12 mg/dL (ref 6–20)
CHLORIDE: 114 mmol/L — AB (ref 101–111)
CO2: 23 mmol/L (ref 22–32)
CREATININE: 1.22 mg/dL (ref 0.61–1.24)
Calcium: 7.4 mg/dL — ABNORMAL LOW (ref 8.9–10.3)
GFR calc non Af Amer: 57 mL/min — ABNORMAL LOW (ref >60)
GLUCOSE: 134 mg/dL — AB (ref 65–99)
Potassium: 3.7 mmol/L (ref 3.5–5.1)
Sodium: 140 mmol/L (ref 135–145)

## 2015-04-01 LAB — BLOOD GAS, ARTERIAL
ACID-BASE DEFICIT: 2.5 mmol/L — AB (ref 0.0–2.0)
Bicarbonate: 21.2 mEq/L (ref 20.0–24.0)
Drawn by: 419771
FIO2: 0.5
O2 Saturation: 97.5 %
PEEP/CPAP: 5 cmH2O
PH ART: 7.425 (ref 7.350–7.450)
Patient temperature: 98.6
RATE: 20 resp/min
TCO2: 22.2 mmol/L (ref 0–100)
VT: 550 mL
pCO2 arterial: 32.9 mmHg — ABNORMAL LOW (ref 35.0–45.0)
pO2, Arterial: 94.2 mmHg (ref 80.0–100.0)

## 2015-04-01 LAB — CBC
HEMATOCRIT: 19.4 % — AB (ref 39.0–52.0)
HEMATOCRIT: 23.3 % — AB (ref 39.0–52.0)
HEMOGLOBIN: 6.4 g/dL — AB (ref 13.0–17.0)
Hemoglobin: 7.6 g/dL — ABNORMAL LOW (ref 13.0–17.0)
MCH: 30 pg (ref 26.0–34.0)
MCH: 29.5 pg (ref 26.0–34.0)
MCHC: 33 g/dL (ref 30.0–36.0)
MCHC: 32.6 g/dL (ref 30.0–36.0)
MCV: 91.1 fL (ref 78.0–100.0)
MCV: 90.3 fL (ref 78.0–100.0)
Platelets: 135 10*3/uL — ABNORMAL LOW (ref 150–400)
Platelets: 147 10*3/uL — ABNORMAL LOW (ref 150–400)
RBC: 2.13 MIL/uL — ABNORMAL LOW (ref 4.22–5.81)
RBC: 2.58 MIL/uL — AB (ref 4.22–5.81)
RDW: 15.9 % — ABNORMAL HIGH (ref 11.5–15.5)
RDW: 16 % — ABNORMAL HIGH (ref 11.5–15.5)
WBC: 6.5 10*3/uL (ref 4.0–10.5)
WBC: 7.2 10*3/uL (ref 4.0–10.5)

## 2015-04-01 LAB — GLUCOSE, CAPILLARY
GLUCOSE-CAPILLARY: 124 mg/dL — AB (ref 65–99)
GLUCOSE-CAPILLARY: 119 mg/dL — AB (ref 65–99)
GLUCOSE-CAPILLARY: 112 mg/dL — AB (ref 65–99)
Glucose-Capillary: 125 mg/dL — ABNORMAL HIGH (ref 65–99)
Glucose-Capillary: 117 mg/dL — ABNORMAL HIGH (ref 65–99)

## 2015-04-01 LAB — URINE CULTURE: Culture: NO GROWTH

## 2015-04-01 LAB — PHOSPHORUS: PHOSPHORUS: 2.1 mg/dL — AB (ref 2.5–4.6)

## 2015-04-01 LAB — PREPARE RBC (CROSSMATCH)

## 2015-04-01 LAB — MAGNESIUM: Magnesium: 2.1 mg/dL (ref 1.7–2.4)

## 2015-04-01 MED ORDER — VITAL HIGH PROTEIN PO LIQD
1000.0000 mL | ORAL | Status: DC
  Administered 2015-04-01: 1000 mL
  Administered 2015-04-01: 09:00:00
  Administered 2015-04-02: 1000 mL
  Administered 2015-04-02 – 2015-04-03 (×2)
  Filled 2015-04-01 (×6): qty 1000

## 2015-04-01 MED ORDER — SODIUM PHOSPHATE 3 MMOLE/ML IV SOLN
30.0000 mmol | Freq: Once | INTRAVENOUS | Status: AC
  Administered 2015-04-01: 30 mmol via INTRAVENOUS
  Filled 2015-04-01: qty 10

## 2015-04-01 MED ORDER — SODIUM CHLORIDE 0.9 % IV SOLN
Freq: Once | INTRAVENOUS | Status: AC
  Administered 2015-04-01: 06:00:00 via INTRAVENOUS

## 2015-04-01 MED ORDER — SODIUM CHLORIDE 0.45 % IV SOLN
INTRAVENOUS | Status: DC
  Administered 2015-04-01 – 2015-04-04 (×3): via INTRAVENOUS

## 2015-04-01 MED ORDER — DIPHENHYDRAMINE HCL 50 MG/ML IJ SOLN
25.0000 mg | Freq: Once | INTRAMUSCULAR | Status: AC
  Administered 2015-04-01: 25 mg via INTRAVENOUS
  Filled 2015-04-01: qty 1

## 2015-04-01 NOTE — Progress Notes (Signed)
This note also relates to the following rows which could not be included: Pulse Rate - Cannot attach notes to unvalidated device data ECG Heart Rate - Cannot attach notes to unvalidated device data Resp - Cannot attach notes to unvalidated device data Arterial Line BP - Cannot attach notes to unvalidated device data SpO2 - Cannot attach notes to unvalidated device data Rate - Cannot attach notes to extension rows   neo dose adjusted

## 2015-04-01 NOTE — Care Management Important Message (Signed)
Important Message  Patient Details  Name: Liberty Stead MRN: [id number] Date of Birth: July 01, 1941   Medicare Important Message Given:  Yes-second notification given    Nathen May 04/01/2015, 11:43 AMImportant Message  Patient Details  Name: Keilan Nichol MRN: [id number] Date of Birth: [birth year]   Medicare Important Message Given:  Yes-second notification given    Nathen May 04/01/2015, 11:43 AM

## 2015-04-01 NOTE — Progress Notes (Signed)
[place] Progress Note Patient Name: Jesse [name] DOB: 1941-04-15 MRN: [id number]   Date of Service  04/01/2015  HPI/Events of Note  Patient with hgb down to 6.4 from 7.9 with h/o of GIB.  Nurse also reporting concern about potential association of fever temporally associated with blood administration yesterday.  eICU Interventions  Plan: Transfuse 1 unit pRBC Pre-transfusion treatment with tylenol supp and benadryl 25 mg IV     Intervention Category Intermediate Interventions: Bleeding - evaluation and treatment with blood products  DETERDING,ELIZABETH 04/01/2015, 4:48 AM

## 2015-04-01 NOTE — Progress Notes (Signed)
CRITICAL VALUE ALERT  Critical value received:  Hemoglobin 6.4  Date of notification:  04/03/15  Time of notification:  4:40  Critical value read back:Yes.    Nurse who received alert:  Deboraha Sprang  MD notified (1st page):  Warren Lacy, MD [place]  Time of first page:  4:42  MD notified (2nd page):  Time of second page:  Responding MD:  Margaree Mackintosh, [place]  Time MD responded:  4:48

## 2015-04-01 NOTE — Consult Note (Signed)
WOC ostomy follow up Stoma type/location: RLQ, end ileostomy Stomal assessment/size: 1 3/8" round, budded, pink, moist. Os points towards the skin level at 7 o'clock  Peristomal assessment: intact Treatment options for stomal/peristomal skin: none used today, may need to add 2" barrier ring once patient has more output Output scant, bloody Ostomy pouching: 2pc. 2 1/4" pouch applied today Education provided: patient remains sedated and on the vent.  No family at the bedside today.  Enrolled patient in [place] Start Discharge program: No  WOC will follow along with you for continued support with ostomy teaching and care [place] RN,CWOCN [phone]

## 2015-04-01 NOTE — Progress Notes (Signed)
[place] Progress Note Patient Name: Jesse [name] DOB: 04-Aug-1940 MRN: [id number]   Date of Service  04/01/2015  HPI/Events of Note  Hypophosphatemia  eICU Interventions  Phos replaced     Intervention Category Intermediate Interventions: Electrolyte abnormality - evaluation and management  Nayleen Janosik 04/01/2015, 4:57 AM

## 2015-04-01 NOTE — Progress Notes (Signed)
Patient ID: Jesse [name], male   DOB: 03-08-1941, 74 y.o.   MRN: [id number] 3 Days Post-Op  Subjective: Pt failed WUA.  Received another unit of pRBCs overnight.  Febrile up to 103 yesterday.  Tolerating TFs at 10cc/hr.  Off levo, just on 35 of neo and likely weaning this today as well.  Objective: Vital signs in last 24 hours: Temp:  [98.3 F (36.8 C)-103 F (39.4 C)] 100.6 F (38.1 C) (09/06 0727) Pulse Rate:  [78-123] 82 (09/06 0723) Resp:  [19-22] 20 (09/06 0723) BP: (86-132)/(52-76) 132/76 mmHg (09/06 0723) SpO2:  [93 %-100 %] 100 % (09/06 0723) Arterial Line BP: (65-164)/(33-83) 113/63 mmHg (09/06 0646) FiO2 (%):  [50 %] 50 % (09/06 0723) Weight:  [108 kg (238 lb 1.6 oz)] 108 kg (238 lb 1.6 oz) (09/06 0417) Last BM Date: 03/29/15  Intake/Output from previous day: 09/05 0701 - 09/06 0700 In: 3658.3 [I.V.:1646.9; Blood:674; NG/GT:302.5; IV Piggyback:1034.9] Out: 1680 [Urine:1455; Emesis/NG output:150; Stool:75] Intake/Output this shift:    PE: Abd: soft, some BS, obese, but not really distended, seems minimally tender as best as I can tell.  Midline wound is clean with staples intact, ostomy with some air and sweat.  Stoma is pink and viable.  No evidence of bleeding  Lab Results:   Recent Labs  04/01/15 0415 04/01/15 0742  WBC 6.5 7.2  HGB 6.4* 7.6*  HCT 19.4* 23.3*  PLT 135* 147*   BMET  Recent Labs  03/31/15 0443 04/01/15 0415  NA 142 140  K 3.7 3.7  CL 117* 114*  CO2 22 23  GLUCOSE 131* 134*  BUN 10 12  CREATININE 1.10 1.22  CALCIUM 7.4* 7.4*   PT/INR  Recent Labs  03/29/15 0756 03/29/15 1007  LABPROT 17.5* 18.6*  INR 1.43 1.54*   CMP     Component Value Date/Time   NA 140 04/01/2015 0415   K 3.7 04/01/2015 0415   CL 114* 04/01/2015 0415   CO2 23 04/01/2015 0415   GLUCOSE 134* 04/01/2015 0415   BUN 12 04/01/2015 0415   CREATININE 1.22 04/01/2015 0415   CALCIUM 7.4* 04/01/2015 0415   PROT 4.4* 03/28/2015 0810   ALBUMIN 2.3* 03/28/2015  0810   AST 22 03/28/2015 0810   ALT 21 03/28/2015 0810   ALKPHOS 34* 03/28/2015 0810   BILITOT 0.9 03/28/2015 0810   GFRNONAA 57* 04/01/2015 0415   GFRAA >60 04/01/2015 0415   Lipase  No results found for: LIPASE     Studies/Results: Dg Chest Port 1 View  04/01/2015   CLINICAL DATA:  Intubation.  EXAM: PORTABLE CHEST - 1 VIEW  COMPARISON:  03/31/2015.  FINDINGS: Endotracheal tube, NG tube, left IJ line in stable position. Stable cardiomegaly. Low lung volumes with bilateral lower lobe atelectasis and/or infiltrates. No pleural effusion or pneumothorax.  IMPRESSION: 1. Lines and tubes in stable position. 2. Low lung volumes with bibasilar atelectasis and/or infiltrates. 3. Stable cardiomegaly.   Electronically Signed   By: Marcello Moores  Register   On: 04/01/2015 07:17   Dg Chest Port 1 View  03/31/2015   CLINICAL DATA:  74 year old male with acute GI bleeding, intubated.  EXAM: PORTABLE CHEST - 1 VIEW  COMPARISON:  03/30/2015 and earlier.  FINDINGS: Portable AP semi upright view at 0535 hours. Stable endotracheal tube tip at the level the clavicles. Enteric tube courses to the left upper quadrant, tip not included. Stable left IJ central line. Increased veiling opacity right greater than left with increased lower lobe opacity and obscuration  of the diaphragm. Stable cardiac size and mediastinal contours. Upper lobes are stable. No pneumothorax or pulmonary edema.  IMPRESSION: 1.  Stable lines and tubes. 2. Progressed bilateral pleural effusions and lower lobe collapse or consolidation.   Electronically Signed   By: Genevie Ann M.D.   On: 03/31/2015 07:49   Dg Chest Port 1 View  03/30/2015   CLINICAL DATA:  Extubated and than the reading intubated.  EXAM: PORTABLE CHEST - 1 VIEW  COMPARISON:  Earlier today.  FINDINGS: Endotracheal tube in satisfactory position. Nasogastric tube extending into the stomach. Increased left basilar airspace opacity. Interval mild right mid lung zone airspace opacity with no  significant change in right lower lung zone airspace opacity. Small left pleural effusion. Stable left jugular catheter. Mild scoliosis.  IMPRESSION: 1. Increased left basilar atelectasis or pneumonia. 2. Small left pleural effusion. 3. Interval mild right mid lung zone probable pneumonia. 4. No significant change in right basilar atelectasis and possible pneumonia.   Electronically Signed   By: Claudie Revering M.D.   On: 03/30/2015 16:14    Anti-infectives: Anti-infectives    Start     Dose/Rate Route Frequency Ordered Stop   03/31/15 2200  vancomycin (VANCOCIN) IVPB 750 mg/150 ml premix     750 mg 150 mL/hr over 60 Minutes Intravenous Every 12 hours 03/31/15 0929     03/31/15 1000  vancomycin (VANCOCIN) 2,000 mg in sodium chloride 0.9 % 500 mL IVPB     2,000 mg 250 mL/hr over 120 Minutes Intravenous  Once 03/31/15 0927 03/31/15 1237   03/31/15 1000  piperacillin-tazobactam (ZOSYN) IVPB 3.375 g     3.375 g 12.5 mL/hr over 240 Minutes Intravenous Every 8 hours 03/31/15 0927     03/29/15 1000  cefoTEtan (CEFOTAN) 2 g in dextrose 5 % 50 mL IVPB     2 g 100 mL/hr over 30 Minutes Intravenous To Surgery 03/29/15 0957 03/29/15 1012       Assessment/Plan POD 3, s/p subtotal abdominal colectomy for massive LGI bleed - cont TFs through NGT.  Will increase to 20cc/hr today -appreciate WOC assistance -no abx therapy needed from our standpoint, but placed on abx therapy for a concern for PNA per primary service -no evidence of active bleeding, ? If hgb keeps dropping due to equilibration still? DVT prophylaxis -SCDs/Lovenox  (? Need to hold lovenox, but no active signs of bleeding, not sure this will help)  LOS: 5 days    Roshanda Balazs E 04/01/2015, 7:55 AM Pager: [phone]

## 2015-04-01 NOTE — Progress Notes (Signed)
PULMONARY / CRITICAL CARE MEDICINE Transfer note   Name: Jesse [name] MRN: [id number] DOB: 12/31/1940    ADMISSION DATE:  03/27/2015  CHIEF COMPLAINT:  Acute lower GIB/hemmorhagic shock  INITIAL PRESENTATION:  3 days melena   SIGNIFICANT EVENTS: Progressive hypotension/shock   HISTORY OF PRESENT ILLNESS:   Jesse [name] is a 74 y.o. male. 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 HTN. 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 Hiatal hernia on EGD ~ 2011. Diverticulosis on colonoscopy ~ 2011. GI bleed with hematochezia in 2015, wife not sure what the source was; did not undergo repeat endoscopic evaluation then. Being managed for massive GI bleed requiring emergent subtotal colectomy.  SUBJECTIVE: fever, drop in hgb, no bleeding noted  VITAL SIGNS: Temp:  [98.3 F (36.8 C)-103 F (39.4 C)] 100.6 F (38.1 C) (09/06 0727) Pulse Rate:  [78-123] 82 (09/06 0723) Resp:  [19-22] 20 (09/06 0723) BP: (86-132)/(52-76) 132/76 mmHg (09/06 0723) SpO2:  [93 %-100 %] 100 % (09/06 0723) Arterial Line BP: (65-164)/(33-83) 113/63 mmHg (09/06 0646) FiO2 (%):  [50 %] 50 % (09/06 0723) Weight:  [108 kg (238 lb 1.6 oz)] 108 kg (238 lb 1.6 oz) (09/06 0417)   HEMODYNAMICS:     VENTILATOR SETTINGS: Vent Mode:  [-] PRVC FiO2 (%):  [50 %] 50 % Set Rate:  [20 bmp] 20 bmp Vt Set:  [550 mL] 550 mL PEEP:  [5 cmH20] 5 cmH20 Plateau Pressure:  [16 cmH20-19 cmH20] 17 cmH20   INTAKE / OUTPUT:  Intake/Output Summary (Last 24 hours) at 04/01/15 0826 Last data filed at 04/01/15 0646  Gross per 24 hour  Intake 3115.67 ml  Output   1580 ml  Net 1535.67 ml   PHYSICAL EXAMINATION: General:  rass -1 Neuro:  Obtunded, int fc, rass -1, moves all ext HEENT:  Line clean, jvd wnl Cardiovascular:  s1 s2 RRR no  Lungs:  Mild coarse Abdomen:  Soft, tender, non distended, normal bowel sound Skin: mild edema  LABS:  CBC  Recent Labs Lab 03/31/15 0830 04/01/15 0415  04/01/15 0742  WBC 7.8 6.5 7.2  HGB 7.9* 6.4* 7.6*  HCT 23.7* 19.4* 23.3*  PLT 146* 135* 147*   Coag's  Recent Labs Lab 03/27/15 1400 03/29/15 0756 03/29/15 1007  APTT 21* 27 32  INR 1.13 1.43 1.54*   BMET  Recent Labs Lab 03/30/15 0457 03/31/15 0443 04/01/15 0415  NA 144 142 140  K 3.6 3.7 3.7  CL 119* 117* 114*  CO2 20* 22 23  BUN 12 10 12   CREATININE 1.25* 1.10 1.22  GLUCOSE 144* 131* 134*   Electrolytes  Recent Labs Lab 03/30/15 0457 03/31/15 0443 04/01/15 0415  CALCIUM 7.6* 7.4* 7.4*  MG 1.3* 1.8 2.1  PHOS 1.6* 1.8* 2.1*   Sepsis Markers  Recent Labs Lab 03/29/15 0800 03/29/15 1625  LATICACIDVEN 2.3* 3.1*   ABG  Recent Labs Lab 03/30/15 1727 03/31/15 0359 04/01/15 0401  PHART 7.302* 7.428 7.425  PCO2ART 39.2 32.8* 32.9*  PO2ART 86.0 142* 94.2   Liver Enzymes  Recent Labs Lab 03/27/15 1400 03/28/15 0810  AST 29 22  ALT 32 21  ALKPHOS 51 34*  BILITOT 0.7 0.9  ALBUMIN 3.4* 2.3*   Cardiac Enzymes No results for input(s): TROPONINI, PROBNP in the last 168 hours. Glucose  Recent Labs Lab 03/31/15 1128 03/31/15 1521 03/31/15 1937 03/31/15 2328 04/01/15 0349 04/01/15 0731  GLUCAP 125* 114* 121* 125* 125* 117*   Imaging Dg Chest Port 1  View  04/01/2015   CLINICAL DATA:  Intubation.  EXAM: PORTABLE CHEST - 1 VIEW  COMPARISON:  03/31/2015.  FINDINGS: Endotracheal tube, NG tube, left IJ line in stable position. Stable cardiomegaly. Low lung volumes with bilateral lower lobe atelectasis and/or infiltrates. No pleural effusion or pneumothorax.  IMPRESSION: 1. Lines and tubes in stable position. 2. Low lung volumes with bibasilar atelectasis and/or infiltrates. 3. Stable cardiomegaly.   Electronically Signed   By: Marcello Moores  Register   On: 04/01/2015 07:17   ASSESSMENT / PLAN:  74 yo male with acute lower GIB likely diverticular, with hemmorhagic shock.  PULMONARY A: ARF, pna likely  P:    - 50-% peep 5 rate  20 , 550 tv, ABG  reviewed, keep same MV  -wean cpap 5 ps 5, goal 2 hr, assess reintubation status when plan extubation  -pcxr in am   -may need chest pt bases / mucomysts  -consider lasix  CARDIOVASCULAR  Right IJ cortis/introducer placed 9/3>>>9/4  - Left IJ central line placed 9/3>>>  - R radial a-line 9/3>>> A: Acute hemmorhagic shock.  S/p 8 UPRBC transfusion P:   -neo to map 60 goal, may need vaso addition   -pcxr concerning edema, limit pos balance  -assess cvp  -dc aline  GASTROINTESTINAL A:  Acute lower GIB suspect diverticular, s/p subtotal colectomy P:    - Tf advance, would NOT hold back for low dose neo pressors  -wound care  ppi  lft in am   Renal A: Hyperchloremia, hypophos - avoid saline Plan:  change to 1/2 NS Chem in am  Phos reaplcement  HEMATOLOGIC  A:  Hemmorhagic shock, anemia, likely today dilutional and continues equilibration affect on hct P:   - Tx 1 unit prbc  -get cvp  Consider lasix, hope to get off neo today  Recheck coags in am   Lovenox, crt to follow daily  Cbc bid  ID: A: Likely asp PNA Send sputum ABX  9/4 vanc>>>9/6 9/4 zosyn>>>  No fevers, culture neg, dc vanc   Ccm time 35 min   Lavon Paganini. Titus Mould, MD, [place] Pgr: [facility]

## 2015-04-02 ENCOUNTER — Inpatient Hospital Stay (HOSPITAL_COMMUNITY): Payer: Medicare (Managed Care)

## 2015-04-02 LAB — CBC WITH DIFFERENTIAL/PLATELET
Basophils Absolute: 0 10*3/uL (ref 0.0–0.1)
Basophils Relative: 0 % (ref 0–1)
EOS PCT: 3 % (ref 0–5)
Eosinophils Absolute: 0.3 10*3/uL (ref 0.0–0.7)
HCT: 21.9 % — ABNORMAL LOW (ref 39.0–52.0)
Hemoglobin: 7 g/dL — ABNORMAL LOW (ref 13.0–17.0)
LYMPHS ABS: 1.1 10*3/uL (ref 0.7–4.0)
LYMPHS PCT: 12 % (ref 12–46)
MCH: 28.9 pg (ref 26.0–34.0)
MCHC: 32 g/dL (ref 30.0–36.0)
MCV: 90.5 fL (ref 78.0–100.0)
MONO ABS: 1.1 10*3/uL — AB (ref 0.1–1.0)
MONOS PCT: 13 % — AB (ref 3–12)
Neutro Abs: 6 10*3/uL (ref 1.7–7.7)
Neutrophils Relative %: 72 % (ref 43–77)
PLATELETS: 160 10*3/uL (ref 150–400)
RBC: 2.42 MIL/uL — AB (ref 4.22–5.81)
RDW: 15.8 % — ABNORMAL HIGH (ref 11.5–15.5)
WBC: 8.4 10*3/uL (ref 4.0–10.5)

## 2015-04-02 LAB — COMPREHENSIVE METABOLIC PANEL
ALBUMIN: 1.5 g/dL — AB (ref 3.5–5.0)
ALT: 24 U/L (ref 17–63)
AST: 34 U/L (ref 15–41)
Alkaline Phosphatase: 35 U/L — ABNORMAL LOW (ref 38–126)
Anion gap: 10 (ref 5–15)
BUN: 14 mg/dL (ref 6–20)
CHLORIDE: 110 mmol/L (ref 101–111)
CO2: 21 mmol/L — AB (ref 22–32)
CREATININE: 1.29 mg/dL — AB (ref 0.61–1.24)
Calcium: 7.6 mg/dL — ABNORMAL LOW (ref 8.9–10.3)
GFR calc Af Amer: >60 mL/min (ref >60)
GFR, EST NON AFRICAN AMERICAN: 54 mL/min — AB (ref >60)
GLUCOSE: 139 mg/dL — AB (ref 65–99)
POTASSIUM: 3.4 mmol/L — AB (ref 3.5–5.1)
SODIUM: 141 mmol/L (ref 135–145)
Total Bilirubin: 0.4 mg/dL (ref 0.3–1.2)
Total Protein: 5.4 g/dL — ABNORMAL LOW (ref 6.5–8.1)

## 2015-04-02 LAB — GLUCOSE, CAPILLARY
GLUCOSE-CAPILLARY: 113 mg/dL — AB (ref 65–99)
GLUCOSE-CAPILLARY: 117 mg/dL — AB (ref 65–99)
GLUCOSE-CAPILLARY: 121 mg/dL — AB (ref 65–99)
GLUCOSE-CAPILLARY: 122 mg/dL — AB (ref 65–99)
Glucose-Capillary: 116 mg/dL — ABNORMAL HIGH (ref 65–99)
Glucose-Capillary: 143 mg/dL — ABNORMAL HIGH (ref 65–99)

## 2015-04-02 LAB — PROTIME-INR
INR: 1.26 (ref 0.00–1.49)
Prothrombin Time: 15.9 seconds — ABNORMAL HIGH (ref 11.6–15.2)

## 2015-04-02 LAB — APTT: APTT: 36 s (ref 24–37)

## 2015-04-02 MED ORDER — CETYLPYRIDINIUM CHLORIDE 0.05 % MT LIQD
7.0000 mL | Freq: Two times a day (BID) | OROMUCOSAL | Status: DC
  Administered 2015-04-04 – 2015-04-15 (×20): 7 mL via OROMUCOSAL

## 2015-04-02 MED ORDER — FUROSEMIDE 10 MG/ML IJ SOLN
20.0000 mg | Freq: Two times a day (BID) | INTRAMUSCULAR | Status: DC
  Administered 2015-04-02 – 2015-04-05 (×8): 20 mg via INTRAVENOUS
  Filled 2015-04-02 (×12): qty 2

## 2015-04-02 MED ORDER — DEXTROSE 5 % IV SOLN
2.0000 g | INTRAVENOUS | Status: DC
  Administered 2015-04-02 – 2015-04-03 (×2): 2 g via INTRAVENOUS
  Filled 2015-04-02 (×4): qty 2

## 2015-04-02 MED ORDER — CHLORHEXIDINE GLUCONATE 0.12 % MT SOLN
15.0000 mL | Freq: Two times a day (BID) | OROMUCOSAL | Status: DC
  Administered 2015-04-02 – 2015-04-14 (×26): 15 mL via OROMUCOSAL
  Filled 2015-04-02 (×23): qty 15

## 2015-04-02 MED ORDER — POTASSIUM CHLORIDE 10 MEQ/50ML IV SOLN
10.0000 meq | INTRAVENOUS | Status: AC
  Administered 2015-04-02 (×4): 10 meq via INTRAVENOUS
  Filled 2015-04-02 (×4): qty 50

## 2015-04-02 NOTE — Progress Notes (Signed)
PULMONARY / CRITICAL CARE MEDICINE Transfer note   Name: Jesse [name] MRN: [id number] DOB: 1941-06-29    ADMISSION DATE:  03/27/2015  CHIEF COMPLAINT:  Acute lower GIB/hemmorhagic shock  INITIAL PRESENTATION:  3 days melena   SIGNIFICANT EVENTS: Progressive hypotension/shock   HISTORY OF PRESENT ILLNESS:   Jesse [name] is a 74 y.o. male. 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 HTN. 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 Hiatal hernia on EGD ~ 2011. Diverticulosis on colonoscopy ~ 2011. GI bleed with hematochezia in 2015, wife not sure what the source was; did not undergo repeat endoscopic evaluation then. Being managed for massive GI bleed requiring emergent subtotal colectomy.  SUBJECTIVE: follows commands  VITAL SIGNS: Temp:  [97.2 F (36.2 C)-99.5 F (37.5 C)] 97.2 F (36.2 C) (09/07 0826) Pulse Rate:  [26-108] 26 (09/07 0800) Resp:  [19-22] 20 (09/07 0800) BP: (83-138)/(50-90) 113/76 mmHg (09/07 0800) SpO2:  [99 %-100 %] 100 % (09/07 0800) Arterial Line BP: (115)/(60) 115/60 mmHg (09/06 1000) FiO2 (%):  [40 %-50 %] 40 % (09/07 0800) Weight:  [110.4 kg (243 lb 6.2 oz)] 110.4 kg (243 lb 6.2 oz) (09/07 0413)   HEMODYNAMICS: CVP:  [9 mmHg-14 mmHg] 10 mmHg   VENTILATOR SETTINGS: Vent Mode:  [-] PRVC FiO2 (%):  [40 %-50 %] 40 % Set Rate:  [20 bmp] 20 bmp Vt Set:  [550 mL] 550 mL PEEP:  [5 cmH20] 5 cmH20 Plateau Pressure:  [16 cmH20-21 cmH20] 21 cmH20   INTAKE / OUTPUT:  Intake/Output Summary (Last 24 hours) at 04/02/15 0944 Last data filed at 04/02/15 0800  Gross per 24 hour  Intake 1600.84 ml  Output    985 ml  Net 615.84 ml   PHYSICAL EXAMINATION: General:  rass -1, follows commands Neuro:  Followed commands?, moves all ext, perr HEENT:  Line clean, jvd wnl Cardiovascular:  s1 s2 RRR no  Lungs:  Mild ronchi Abdomen:  Soft, tender, non distended, normal bowel sound Skin: mild edema  LABS:  CBC  Recent Labs Lab  04/01/15 0415 04/01/15 0742 04/02/15 0455  WBC 6.5 7.2 8.4  HGB 6.4* 7.6* 7.0*  HCT 19.4* 23.3* 21.9*  PLT 135* 147* 160   Coag's  Recent Labs Lab 03/29/15 0756 03/29/15 1007 04/02/15 0455  APTT 27 32 36  INR 1.43 1.54* 1.26   BMET  Recent Labs Lab 03/31/15 0443 04/01/15 0415 04/02/15 0455  NA 142 140 141  K 3.7 3.7 3.4*  CL 117* 114* 110  CO2 22 23 21*  BUN 10 12 14   CREATININE 1.10 1.22 1.29*  GLUCOSE 131* 134* 139*   Electrolytes  Recent Labs Lab 03/30/15 0457 03/31/15 0443 04/01/15 0415 04/02/15 0455  CALCIUM 7.6* 7.4* 7.4* 7.6*  MG 1.3* 1.8 2.1  --   PHOS 1.6* 1.8* 2.1*  --    Sepsis Markers  Recent Labs Lab 03/29/15 0800 03/29/15 1625  LATICACIDVEN 2.3* 3.1*   ABG  Recent Labs Lab 03/30/15 1727 03/31/15 0359 04/01/15 0401  PHART 7.302* 7.428 7.425  PCO2ART 39.2 32.8* 32.9*  PO2ART 86.0 142* 94.2   Liver Enzymes  Recent Labs Lab 03/27/15 1400 03/28/15 0810 04/02/15 0455  AST 29 22 34  ALT 32 21 24  ALKPHOS 51 34* 35*  BILITOT 0.7 0.9 0.4  ALBUMIN 3.4* 2.3* 1.5*   Cardiac Enzymes No results for input(s): TROPONINI, PROBNP in the last 168 hours. Glucose  Recent Labs Lab 04/01/15 1152 04/01/15 1516 04/01/15 2022 04/02/15 0002  04/02/15 0420 04/02/15 0824  GLUCAP 124* 119* 112* 113* 122* 116*   Imaging Dg Chest Port 1 View  04/02/2015   CLINICAL DATA:  Hypoxia  EXAM: PORTABLE CHEST - 1 VIEW  COMPARISON:  April 01, 2015  FINDINGS: Endotracheal tube tip is 4.7 cm above the carina. Central catheter tip is in the superior vena cava just beyond the junction with the right atrium. Nasogastric tube tip is at the gastroesophageal junction with the side port slightly above the gastroesophageal junction. No pneumothorax. There is patchy infiltrate in both lung bases with left lower lobe consolidation. Infiltrate is more patchy on the right. Heart is upper normal in size with pulmonary vascularity within normal limits. No  adenopathy.  IMPRESSION: Airspace disease in both lower lobes with consolidation in the left base and more patchy infiltrate in the right base. These changes are stable. No change in cardiac silhouette. Tube and catheter positions are unchanged without pneumothorax. Note that the nasogastric tube side port is above the gastroesophageal junction. It may be prudent to advance nasogastric tube 8-10 cm to insure that both nasogastric tube tip and side port are well within the stomach.   Electronically Signed   By: Lowella Grip III M.D.   On: 04/02/2015 06:59   ASSESSMENT / PLAN:  74 yo male with acute lower GIB likely diverticular, with hemmorhagic shock.  PULMONARY A: ARF, pna likely  P:    -wean attempted, apnea noted, reduce fent and repeat wean attempt 5/5  -pcxr in am , today improved aeration rt base  -consider lasix to even, neg balance today  CARDIOVASCULAR  Right IJ cortis/introducer placed 9/3>>>9/4  - Left IJ central line placed 9/3>>>  - R radial a-line 9/3>>> A: Acute hemmorhagic shock.  S/p 8 UPRBC transfusion P:   -neo is on thwe lowest dose I have ever seen in my career, wil dc this and asses amp , last MAP is 84  -will accept MAp 60-65  If unabel to dc neo, will dc precedex today  GASTROINTESTINAL A:  Acute lower GIB suspect diverticular, s/p subtotal colectomy P:    - tf to 20 , per ccs  -ppi  Renal A: Hyperchloremia, hypopho, hypoK Plan:  1/2 NS kvo Chem in am  Lasix  k supp  HEMATOLOGIC  A:  Hemmorhagic shock, anemia, likely today dilutional and continues equilibration affect on hct P:   Consider lasix  Recheck coags- wnl  Lovenox, crt   Cbc am  ID: A: Likely asp PNA Send sputum ABX  9/4 vanc>>>9/6 9/4 zosyn>>>9/7 9/7 ceftriaxone>>>stop 11th  Add ceftriaxone pcxr in am , improved today  Neuro: Dementia, agitation  Precedex, may dc if not off baby neo fent stop as apnic  Ccm time 35 min   Lavon Paganini. Titus Mould, MD, [place] Pgr:  [facility]

## 2015-04-02 NOTE — Progress Notes (Signed)
[facility] Progress Note Patient Name: Jesse [name] DOB: [birth year] MRN: [id number]   Date of Service  04/02/2015  HPI/Events of Note  Hypokalemia  eICU Interventions  Potassium replaced     Intervention Category Minor Interventions: Electrolytes abnormality - evaluation and management  Donice Alperin 04/02/2015, 6:22 AM

## 2015-04-02 NOTE — Progress Notes (Signed)
While in bilateral wrist restraints, patient pulled out NG tube.  Patient had legs out of the bed, and was still trying to pull at lines (BP cuff, foley).  While repositioning the patient, he became physically aggressive with kicking, hitting, biting, and spitting at all three of [place] after multiple attempts to reorient and calm the patient.  Orders received for bilateral leg restraints.

## 2015-04-02 NOTE — Progress Notes (Signed)
[facility] Progress Note Patient Name: Lochlin Eppinger DOB: 1940-10-25 MRN: [id number]   Date of Service  04/02/2015  HPI/Events of Note  Request to renew restraints.   eICU Interventions  Restraint order renewed.      Intervention Category Minor Interventions: Routine modifications to care plan (e.g. PRN medications for pain, fever)  Sommer,Steven Eugene 04/02/2015, 9:15 PM

## 2015-04-02 NOTE — Evaluation (Signed)
Physical Therapy Evaluation Patient Details Name: Jesse [name] MRN: [id number] DOB: September 20, 1940 Today's Date: 04/02/2015   History of Present Illness  Pt is a 74 y/o male with a PMH of dementia (wife reports sundowning), DMII, HTN, hep-C. Pt presents with GI bleed requiring emergent subtotal colectomy.   Clinical Impression  Pt admitted with above diagnosis. Pt currently with functional limitations due to the deficits listed below (see PT Problem List). At the time of PT eval pt was able to perform transfers to/from EOB with +2 physical assist and management of lines/tubes.   Wife present during session and discusses pt's dementia and history of sundowning. She reports that she would like to keep pt at home as long as possible before having him live in a memory care facility, as he still recognizes family and interacts well during the day. Discharge planning is complicated by the dementia (limited interaction during therapy), and that pt/wife are currently here visiting from [place]. It appears that pt was completely independent PTA, however it was difficult to get a true picture of current functional status during evaluation. Recommending some form of post-acute rehab at d/c, and based on today's assessment, SNF may be the most appropriate. Would like to return pt to most familiar environment - if not in [place], then home with family. Will continue to assess pt for most appropriate d/c venue for continued rehab, as I anticipate a fairly good physical recovery.     Follow Up Recommendations Post-acute rehab. TBD by progress with PT.    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 Needed 6 people to restrain him at one point. NG  Tube Restrictions Weight Bearing Restrictions: No      Mobility  Bed Mobility Overal bed mobility: Needs Assistance Bed Mobility: Supine to Sit;Sit to Supine     Supine to sit: Max assist;+2 for physical assistance Sit to supine: Max assist;+2 for physical assistance   General bed mobility comments: HOB elevated to sit up. Pt minimally reaching for bed rail with L hand and VC's.  Transfers                    Ambulation/Gait                Stairs            Wheelchair Mobility    Modified Rankin (Stroke Patients Only)       Balance Overall balance assessment: Needs assistance Sitting-balance support: Feet supported;Bilateral upper extremity supported Sitting balance-Leahy Scale: Poor Sitting balance - Comments: mod assist at times to maintain seated posture Postural control: Posterior lean;Other (comment) (anterior lean)                                   Pertinent Vitals/Pain      Home Living Family/patient expects to be discharged to:: Private residence Living Arrangements: Spouse/significant other Available Help at Discharge: Family;Available 24 hours/day             Additional Comments: Pt and wife are visiting from [place]. Wife reports she is in a hotel for now, and is unsure whether they will be going to a hotel, daughter's home, or back  to [place] when pt is d/c'd.    Prior Function Level of Independence: Independent         Comments: Wife reports that pt was independent at baseline, however sundowned and did not know family at night     Hand Dominance        Extremity/Trunk Assessment   Upper Extremity Assessment: Defer to OT evaluation           Lower Extremity Assessment: Generalized weakness      Cervical / Trunk Assessment: Normal  Communication      Cognition Arousal/Alertness: Awake/alert Behavior During Therapy: Flat affect (absent staring at times) Overall Cognitive Status: History  of cognitive impairments - at baseline                      General Comments      Exercises        Assessment/Plan    PT Assessment Patient needs continued PT services  PT Diagnosis Difficulty walking;Altered mental status   PT Problem List Decreased strength;Decreased range of motion;Decreased activity tolerance;Decreased balance;Decreased mobility;Decreased knowledge of use of DME;Decreased safety awareness;Decreased knowledge of precautions  PT Treatment Interventions DME instruction;Stair training;Gait training;Functional mobility training;Therapeutic activities;Therapeutic exercise;Neuromuscular re-education;Patient/family education   PT Goals (Current goals can be found in the Care Plan section) Acute Rehab PT Goals Patient Stated Goal: Pt did not state goals during session.  PT Goal Formulation: With family Time For Goal Achievement: 04/16/15 Potential to Achieve Goals: Good    Frequency Min 3X/week   Barriers to discharge        Co-evaluation               End of Session   Activity Tolerance: Patient limited by fatigue Patient left: in bed;with call bell/phone within reach;with nursing/sitter in room;with family/visitor present;with restraints reapplied Nurse Communication: Mobility status         Time: 8466-5993 PT Time Calculation (min) (ACUTE ONLY): 23 min   Charges:   PT Evaluation $Initial PT Evaluation Tier I: 1 Procedure PT Treatments $Therapeutic Activity: 8-22 mins   PT G Codes:        Rolinda Roan 05/02/2015, 1:27 PM   Rolinda Roan, PT, DPT Acute Rehabilitation Services Pager: [phone]

## 2015-04-02 NOTE — Care Management Note (Signed)
Case Management Note  Patient Details  Name: Loys Shugars MRN: [id number] Date of Birth: [birth year]  Subjective/Objective:    Wife and patient from [place].  Daughter and wife's sister live here, also other relatives scattered here in [place].  Wife living at present in a hotel, daughter and sister do not have room for her.  Patient extubated today. On [place] oxygen.  Not talking to me, but wife states he did say something to her.  Does have dementia and does better when she is here, but is looking for a place for them to live and won't be able to be here all the time.  Discussed need for  Possible rehab for patient prior to discharge.  Wife hesitant stating he does better when she is with him.  Plan for PT/OT to see him to assist with eval.  Will continue to follow for progression.                 Action/Plan:   Expected Discharge Date:                  Expected Discharge Plan:  [place]  In-House Referral:     Discharge planning Services  CM Consult  Post Acute Care Choice:    Choice offered to:     DME Arranged:    DME Agency:     HH Arranged:    [place] Agency:     Status of Service:  In process, will continue to follow  Medicare Important Message Given:  Yes-second notification given Date Medicare IM Given:    Medicare IM give by:    Date Additional Medicare IM Given:    Additional Medicare Important Message give by:     If discussed at [place] of Stay Meetings, dates discussed:    Additional Comments:  Vergie Living, RN 04/02/2015, 2:22 PM

## 2015-04-02 NOTE — Progress Notes (Signed)
[facility] Progress Note Patient Name: Fynn Vanblarcom DOB: 06-23-1941 MRN: [id number]   Date of Service  04/02/2015  HPI/Events of Note  Nurse now requests bilateral ankle restraints as patient is attempting to get out of bed.   eICU Interventions  Will order restraints as requested.      Intervention Category Minor Interventions: Agitation / anxiety - evaluation and management  Sommer,Steven Eugene 04/02/2015, 10:00 PM

## 2015-04-02 NOTE — Procedures (Signed)
Extubation Procedure Note  Patient Details:   Name: Jesse [name] DOB: [birth year] MRN: [id number]   Airway Documentation:     Evaluation  O2 sats: stable throughout Complications: No apparent complications Patient did tolerate procedure well. Bilateral Breath Sounds: Rhonchi Suctioning: Airway Yes   Patient extubated to 2L nasal cannula per MD order.  Positive cuff leak noted.  No evidence of stridor.  Sats currently 100%.  Vitals are stable.  Coaching patient on coughing.  Will continue to monitor.   Philomena Doheny 04/02/2015, 10:55 AM

## 2015-04-02 NOTE — Progress Notes (Signed)
4 Days Post-Op  Subjective: Awake on vent  Objective: Vital signs in last 24 hours: Temp:  [97.2 F (36.2 C)-99.5 F (37.5 C)] 97.2 F (36.2 C) (09/07 0826) Pulse Rate:  [32-108] 82 (09/07 0710) Resp:  [19-22] 20 (09/07 0710) BP: (83-138)/(50-90) 92/68 mmHg (09/07 0710) SpO2:  [99 %-100 %] 100 % (09/07 0710) Arterial Line BP: (112-115)/(56-60) 115/60 mmHg (09/06 1000) FiO2 (%):  [40 %-50 %] 40 % (09/07 0710) Weight:  [110.4 kg (243 lb 6.2 oz)] 110.4 kg (243 lb 6.2 oz) (09/07 0413) Last BM Date: 04/01/15  Intake/Output from previous day: 09/06 0701 - 09/07 0700 In: 1604.9 [I.V.:914.9; NG/GT:540; IV Piggyback:150] Out: 960 [Urine:960] Intake/Output this shift:    General appearance: no distress Resp: rhonchi bilaterally Cardio: some ectopy GI: soft, incision OK with loose staples, active BS, stoma pink with min serous output Neuro: F/C to move L toes  Lab Results:   Recent Labs  04/01/15 0742 04/02/15 0455  WBC 7.2 8.4  HGB 7.6* 7.0*  HCT 23.3* 21.9*  PLT 147* 160   BMET  Recent Labs  04/01/15 0415 04/02/15 0455  NA 140 141  K 3.7 3.4*  CL 114* 110  CO2 23 21*  GLUCOSE 134* 139*  BUN 12 14  CREATININE 1.22 1.29*  CALCIUM 7.4* 7.6*   PT/INR  Recent Labs  04/02/15 0455  LABPROT 15.9*  INR 1.26   ABG  Recent Labs  03/31/15 0359 04/01/15 0401  PHART 7.428 7.425  HCO3 21.2 21.2    Studies/Results: Dg Chest Port 1 View  04/02/2015   CLINICAL DATA:  Hypoxia  EXAM: PORTABLE CHEST - 1 VIEW  COMPARISON:  April 01, 2015  FINDINGS: Endotracheal tube tip is 4.7 cm above the carina. Central catheter tip is in the superior vena cava just beyond the junction with the right atrium. Nasogastric tube tip is at the gastroesophageal junction with the side port slightly above the gastroesophageal junction. No pneumothorax. There is patchy infiltrate in both lung bases with left lower lobe consolidation. Infiltrate is more patchy on the right. Heart is upper  normal in size with pulmonary vascularity within normal limits. No adenopathy.  IMPRESSION: Airspace disease in both lower lobes with consolidation in the left base and more patchy infiltrate in the right base. These changes are stable. No change in cardiac silhouette. Tube and catheter positions are unchanged without pneumothorax. Note that the nasogastric tube side port is above the gastroesophageal junction. It may be prudent to advance nasogastric tube 8-10 cm to insure that both nasogastric tube tip and side port are well within the stomach.   Electronically Signed   By: Lowella Grip III M.D.   On: 04/02/2015 06:59   Dg Chest Port 1 View  04/01/2015   CLINICAL DATA:  Intubation.  EXAM: PORTABLE CHEST - 1 VIEW  COMPARISON:  03/31/2015.  FINDINGS: Endotracheal tube, NG tube, left IJ line in stable position. Stable cardiomegaly. Low lung volumes with bilateral lower lobe atelectasis and/or infiltrates. No pleural effusion or pneumothorax.  IMPRESSION: 1. Lines and tubes in stable position. 2. Low lung volumes with bibasilar atelectasis and/or infiltrates. 3. Stable cardiomegaly.   Electronically Signed   By: Marcello Moores  Register   On: 04/01/2015 07:17    Anti-infectives: Anti-infectives    Start     Dose/Rate Route Frequency Ordered Stop   03/31/15 2200  vancomycin (VANCOCIN) IVPB 750 mg/150 ml premix  Status:  Discontinued     750 mg 150 mL/hr over 60 Minutes Intravenous  Every 12 hours 03/31/15 0929 04/01/15 0839   03/31/15 1000  vancomycin (VANCOCIN) 2,000 mg in sodium chloride 0.9 % 500 mL IVPB     2,000 mg 250 mL/hr over 120 Minutes Intravenous  Once 03/31/15 0927 03/31/15 1237   03/31/15 1000  piperacillin-tazobactam (ZOSYN) IVPB 3.375 g     3.375 g 12.5 mL/hr over 240 Minutes Intravenous Every 8 hours 03/31/15 0927     03/29/15 1000  cefoTEtan (CEFOTAN) 2 g in dextrose 5 % 50 mL IVPB     2 g 100 mL/hr over 30 Minutes Intravenous To Surgery 03/29/15 0957 03/29/15 1012       Assessment/Plan: s/p Procedure(s): EXPLORATORY LAP WITH TOTAL COLECTOMY (N/A) CYSTOSCOPY WITH PLACEMENT OF 18 FRENCH COUDE CATHETER. (N/A)  LOS: 6 days  POD 4, s/p subtotal abdominal colectomy for massive LGI bleed - cont TFs through NGT at 20cc/h until bowel function improves -appreciate WOC assistance -ABL anemia mostly from fluid shift equilibration DVT prophylaxis -SCDs/Lovenox    Niaomi Cartaya E 04/02/2015

## 2015-04-03 ENCOUNTER — Inpatient Hospital Stay (HOSPITAL_COMMUNITY): Payer: Medicare (Managed Care)

## 2015-04-03 LAB — CULTURE, RESPIRATORY
CULTURE: NO GROWTH
SPECIAL REQUESTS: NORMAL

## 2015-04-03 LAB — CBC WITH DIFFERENTIAL/PLATELET
BASOS ABS: 0 10*3/uL (ref 0.0–0.1)
Basophils Relative: 0 % (ref 0–1)
EOS ABS: 0.3 10*3/uL (ref 0.0–0.7)
EOS PCT: 4 % (ref 0–5)
HCT: 20.5 % — ABNORMAL LOW (ref 39.0–52.0)
Hemoglobin: 6.7 g/dL — CL (ref 13.0–17.0)
LYMPHS PCT: 14 % (ref 12–46)
Lymphs Abs: 1 10*3/uL (ref 0.7–4.0)
MCH: 29.5 pg (ref 26.0–34.0)
MCHC: 32.7 g/dL (ref 30.0–36.0)
MCV: 90.2 fL (ref 78.0–100.0)
Monocytes Absolute: 0.9 10*3/uL (ref 0.1–1.0)
Monocytes Relative: 13 % — ABNORMAL HIGH (ref 3–12)
NEUTROS PCT: 70 % (ref 43–77)
Neutro Abs: 5 10*3/uL (ref 1.7–7.7)
PLATELETS: 212 10*3/uL (ref 150–400)
RBC: 2.34 MIL/uL — AB (ref 4.22–5.81)
RDW: 15.1 % (ref 11.5–15.5)
WBC: 7.2 10*3/uL (ref 4.0–10.5)

## 2015-04-03 LAB — CBC
HCT: 25.1 % — ABNORMAL LOW (ref 39.0–52.0)
Hemoglobin: 8.2 g/dL — ABNORMAL LOW (ref 13.0–17.0)
MCH: 29.7 pg (ref 26.0–34.0)
MCHC: 32.7 g/dL (ref 30.0–36.0)
MCV: 90.9 fL (ref 78.0–100.0)
PLATELETS: 222 10*3/uL (ref 150–400)
RBC: 2.76 MIL/uL — AB (ref 4.22–5.81)
RDW: 15.1 % (ref 11.5–15.5)
WBC: 8.5 10*3/uL (ref 4.0–10.5)

## 2015-04-03 LAB — BASIC METABOLIC PANEL
Anion gap: 5 (ref 5–15)
BUN: 11 mg/dL (ref 6–20)
CO2: 25 mmol/L (ref 22–32)
CREATININE: 0.97 mg/dL (ref 0.61–1.24)
Calcium: 8 mg/dL — ABNORMAL LOW (ref 8.9–10.3)
Chloride: 111 mmol/L (ref 101–111)
Glucose, Bld: 140 mg/dL — ABNORMAL HIGH (ref 65–99)
POTASSIUM: 3.6 mmol/L (ref 3.5–5.1)
SODIUM: 141 mmol/L (ref 135–145)

## 2015-04-03 LAB — GLUCOSE, CAPILLARY
GLUCOSE-CAPILLARY: 135 mg/dL — AB (ref 65–99)
GLUCOSE-CAPILLARY: 102 mg/dL — AB (ref 65–99)
GLUCOSE-CAPILLARY: 100 mg/dL — AB (ref 65–99)
GLUCOSE-CAPILLARY: 85 mg/dL (ref 65–99)
Glucose-Capillary: 116 mg/dL — ABNORMAL HIGH (ref 65–99)
Glucose-Capillary: 125 mg/dL — ABNORMAL HIGH (ref 65–99)

## 2015-04-03 LAB — CULTURE, RESPIRATORY W GRAM STAIN

## 2015-04-03 LAB — MASSIVE TRANSFUSION PROTOCOL ORDER (BLOOD BANK NOTIFICATION)

## 2015-04-03 LAB — PREPARE RBC (CROSSMATCH)

## 2015-04-03 LAB — LACTIC ACID, PLASMA: Lactic Acid, Venous: 2.3 mmol/L (ref 0.5–2.0)

## 2015-04-03 MED ORDER — HALOPERIDOL LACTATE 2 MG/ML PO CONC
2.0000 mg | ORAL | Status: DC | PRN
  Filled 2015-04-03: qty 1

## 2015-04-03 MED ORDER — HALOPERIDOL 2 MG PO TABS
2.0000 mg | ORAL_TABLET | ORAL | Status: DC | PRN
  Filled 2015-04-03: qty 1

## 2015-04-03 MED ORDER — SODIUM CHLORIDE 0.9 % IV SOLN: Freq: Once | INTRAVENOUS | Status: DC

## 2015-04-03 MED ORDER — HALOPERIDOL LACTATE 5 MG/ML IJ SOLN
2.0000 mg | INTRAMUSCULAR | Status: DC | PRN
  Administered 2015-04-03: 2 mg via INTRAVENOUS
  Filled 2015-04-03: qty 1

## 2015-04-03 MED ORDER — SODIUM CHLORIDE 0.9 % IV SOLN
Freq: Once | INTRAVENOUS | Status: AC
  Administered 2015-04-03: 04:00:00 via INTRAVENOUS

## 2015-04-03 MED ORDER — POTASSIUM CHLORIDE 10 MEQ/50ML IV SOLN
10.0000 meq | INTRAVENOUS | Status: AC
  Administered 2015-04-03 (×2): 10 meq via INTRAVENOUS
  Filled 2015-04-03 (×2): qty 50

## 2015-04-03 MED ORDER — HALOPERIDOL LACTATE 5 MG/ML IJ SOLN: 1.0000 mg | INTRAMUSCULAR | Status: DC | PRN

## 2015-04-03 NOTE — Progress Notes (Signed)
SLP Cancellation Note  Patient Details Name: Jesse [name] MRN: [id number] DOB: 10/23/1940   Cancelled treatment:       Reason Eval/Treat Not Completed: Patient not medically ready. MD said Ok to wait. Pt with large NG.   Herbie [place], [place] CCC-SLP [phone]  Lynann Beaver 04/03/2015, 11:29 AM

## 2015-04-03 NOTE — Progress Notes (Signed)
Wasted 200 cc of fentanyl in room sink.  Witnessed by Quillian Quince, RN.

## 2015-04-03 NOTE — Progress Notes (Signed)
[facility] Progress Note Patient Name: Jesse [name] DOB: 06/27/1941 MRN: [id number]   Date of Service  04/03/2015  HPI/Events of Note  Hb=6.7  eICU Interventions  Transfuse 1unit prbc     Intervention Category Intermediate Interventions: Other:  Titianna Loomis 04/03/2015, 3:52 AM

## 2015-04-03 NOTE — Progress Notes (Signed)
PULMONARY / CRITICAL CARE MEDICINE Transfer note   Name: Ewell Benassi MRN: [id number] DOB: [birth year]    ADMISSION DATE:  03/27/2015  CHIEF COMPLAINT:  Acute lower GIB/hemmorhagic shock  INITIAL PRESENTATION:  3 days melena   SIGNIFICANT EVENTS: Progressive hypotension/shock   HISTORY OF PRESENT ILLNESS:   Marven Veley is a 74 y.o. male. 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 HTN. 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 Hiatal hernia on EGD ~ 2011. Diverticulosis on colonoscopy ~ 2011. GI bleed with hematochezia in 2015, wife not sure what the source was; did not undergo repeat endoscopic evaluation then. Being managed for massive GI bleed requiring emergent subtotal colectomy.  SUBJECTIVE: follows commands, confused  VITAL SIGNS: Temp:  [97.5 F (36.4 C)-98.5 F (36.9 C)] 98.1 F (36.7 C) (09/08 0830) Pulse Rate:  [69-135] 93 (09/08 0900) Resp:  [12-27] 12 (09/08 0900) BP: (85-152)/(46-95) 119/60 mmHg (09/08 0900) SpO2:  [97 %-100 %] 100 % (09/08 0900) Weight:  [107.9 kg (237 lb 14 oz)] 107.9 kg (237 lb 14 oz) (09/08 0500)   HEMODYNAMICS: CVP:  [9 mmHg-10 mmHg] 9 mmHg   VENTILATOR SETTINGS:     INTAKE / OUTPUT:  Intake/Output Summary (Last 24 hours) at 04/03/15 1025 Last data filed at 04/03/15 0900  Gross per 24 hour  Intake 1384.16 ml  Output   3740 ml  Net -2355.84 ml   PHYSICAL EXAMINATION: General:  folows commands Neuro:  Followed commands, speaking about war HEENT:  Line clean, jvd wnl Cardiovascular:  s1 s2 RRR no  Lungs:  CTA Abdomen:  Soft, tender, non distended, normal bowel sound Skin: mild edema  LABS:  CBC  Recent Labs Lab 04/02/15 0455 04/03/15 0317 04/03/15 0802  WBC 8.4 7.2 8.5  HGB 7.0* 6.7* 8.2*  HCT 21.9* 20.5* 25.1*  PLT 160 212 222   Coag's  Recent Labs Lab 03/29/15 0756 03/29/15 1007 04/02/15 0455  APTT 27 32 36  INR 1.43 1.54* 1.26   BMET  Recent Labs Lab 04/01/15 0415  04/02/15 0455 04/03/15 0317  NA 140 141 141  K 3.7 3.4* 3.6  CL 114* 110 111  CO2 23 21* 25  BUN 12 14 11   CREATININE 1.22 1.29* 0.97  GLUCOSE 134* 139* 140*   Electrolytes  Recent Labs Lab 03/30/15 0457 03/31/15 0443 04/01/15 0415 04/02/15 0455 04/03/15 0317  CALCIUM 7.6* 7.4* 7.4* 7.6* 8.0*  MG 1.3* 1.8 2.1  --   --   PHOS 1.6* 1.8* 2.1*  --   --    Sepsis Markers  Recent Labs Lab 03/29/15 0800 03/29/15 1625  LATICACIDVEN 2.3* 3.1*   ABG  Recent Labs Lab 03/30/15 1727 03/31/15 0359 04/01/15 0401  PHART 7.302* 7.428 7.425  PCO2ART 39.2 32.8* 32.9*  PO2ART 86.0 142* 94.2   Liver Enzymes  Recent Labs Lab 03/27/15 1400 03/28/15 0810 04/02/15 0455  AST 29 22 34  ALT 32 21 24  ALKPHOS 51 34* 35*  BILITOT 0.7 0.9 0.4  ALBUMIN 3.4* 2.3* 1.5*   Cardiac Enzymes No results for input(s): TROPONINI, PROBNP in the last 168 hours. Glucose  Recent Labs Lab 04/02/15 1135 04/02/15 1554 04/02/15 1939 04/02/15 2337 04/03/15 0324 04/03/15 0811  GLUCAP 143* 121* 117* 125* 135* 102*   Imaging Dg Chest Port 1 View  04/03/2015   CLINICAL DATA:  Respiratory acidosis.  EXAM: PORTABLE CHEST - 1 VIEW  COMPARISON:  04/02/2015.  FINDINGS: Interim extubation. NG tube and left IJ line in stable  position. Stable cardiomegaly. Persistent but improving right lower lobe infiltrate. No pneumothorax. No acute bony abnormality .  IMPRESSION: 1. Interim extubation.  NG tube and left IJ line in stable position. 2. Persistent but improving right lower lobe infiltrate. 3. Stable cardiomegaly.   Electronically Signed   By: Marcello Moores  Register   On: 04/03/2015 07:23   Dg Abd Portable 1v  04/02/2015   CLINICAL DATA:  NG tube placement  EXAM: PORTABLE ABDOMEN - 1 VIEW  COMPARISON:  None.  FINDINGS: NG tube projects over the stomach. Numerous mildly distended loops of small bowel. Gas is seen into the rectum.  IMPRESSION: NG tube over the stomach.   Electronically Signed   By: Skipper Cliche  M.D.   On: 04/02/2015 23:48   ASSESSMENT / PLAN:  74 yo male with acute lower GIB likely diverticular, with hemmorhagic shock.  PULMONARY A: ARF, pna likely resolving P:    -IS  -pcxr better, no repeat needed  -lasix maintain  CARDIOVASCULAR  Right IJ cortis/introducer placed 9/3>>>9/4  - Left IJ central line placed 9/3>>>plan to dc 9/8  - R radial a-line 9/3>>>9/7 A: Acute hemmorhagic shock.  S/p 8 UPRBC transfusion P:   -lasix  -tele  GASTROINTESTINAL A:  Acute lower GIB suspect diverticular, s/p subtotal colectomy P:    - tf to 20 , per ccs to remain at 20  -ppi  Renal A: Hyperchloremia, hypopho, hypoK Plan:  kvo Chem in am  Lasix keep  HEMATOLOGIC  A:  Hemmorhagic shock, anemia, likely today dilutional and continues equilibration affect on hct, hemoconcetration noted and 1 unit 9/8 P:   Lovenox  Cbc am  ID: A: Likely asp PNA Send sputum ABX  9/4 vanc>>>9/6 9/4 zosyn>>>9/7 9/7 ceftriaxone>>>stop 11th  Maintain ceftriaxone to stop date  Neuro: Dementia, agitation  qtc less 500, haldol    Lavon Paganini. Titus Mould, MD, [place] Pgr: [place]

## 2015-04-03 NOTE — Progress Notes (Signed)
[facility] ADULT ICU REPLACEMENT PROTOCOL FOR AM LAB REPLACEMENT ONLY  The patient does apply for the [facility] Adult ICU Electrolyte Replacment Protocol based on the criteria listed below:   1. Is GFR >/= 40 ml/min? Yes.    Patient's GFR today is >60 2. Is urine output >/= 0.5 ml/kg/hr for the last 6 hours? Yes.   Patient's UOP is 1.15 ml/kg/hr 3. Is BUN < 60 mg/dL? Yes.    Patient's BUN today is 11 4. Abnormal electrolyte(s): Potassium 3.6 5. Ordered repletion with: Potassium per protocol   Lotus Santillo P 04/03/2015 6:05 AM

## 2015-04-03 NOTE — Care Management Note (Signed)
E-link spoke with radiology; NG tube is in the stomach and safe to use.

## 2015-04-03 NOTE — Progress Notes (Signed)
CRITICAL VALUE ALERT  Critical value received:  Hemoglobin 6.7  Date of notification:  04/03/15  Time of notification:  3:50  Critical value read back:Yes.    Nurse who received alert:  Norva Pavlov, RN  MD notified (1st page):  Warren Lacy, MD [place]  Time of first page:  3:50  MD notified (2nd page):  Time of second page:  Responding MD:  Margaree Mackintosh, MD [place]  Time MD responded:  3:50

## 2015-04-03 NOTE — Progress Notes (Signed)
5 Days Post-Op  Subjective: Tolerated extubation  Objective: Vital signs in last 24 hours: Temp:  [97.2 F (36.2 C)-98.5 F (36.9 C)] 98.5 F (36.9 C) (09/08 0545) Pulse Rate:  [69-135] 91 (09/08 0800) Resp:  [11-27] 17 (09/08 0800) BP: (85-152)/(46-95) 136/84 mmHg (09/08 0800) SpO2:  [97 %-100 %] 100 % (09/08 0800) FiO2 (%):  [40 %] 40 % (09/07 0957) Weight:  [107.9 kg (237 lb 14 oz)] 107.9 kg (237 lb 14 oz) (09/08 0500) Last BM Date: 04/01/15  Intake/Output from previous day: 09/07 0701 - 09/08 0700 In: 1535.5 [I.V.:758.8; Blood:329; NG/GT:447.7] Out: 8333 [Urine:3580; Stool:10] Intake/Output this shift: Total I/O In: 77.8 [I.V.:37.8; NG/GT:40] Out: -   General appearance: cooperative Nose: NGT Resp: few rhonchi cleared by cough GI: soft, active BS, wound OK, ileostomy with serous output  Lab Results:   Recent Labs  04/02/15 0455 04/03/15 0317  WBC 8.4 7.2  HGB 7.0* 6.7*  HCT 21.9* 20.5*  PLT 160 212   BMET  Recent Labs  04/02/15 0455 04/03/15 0317  NA 141 141  K 3.4* 3.6  CL 110 111  CO2 21* 25  GLUCOSE 139* 140*  BUN 14 11  CREATININE 1.29* 0.97  CALCIUM 7.6* 8.0*   PT/INR  Recent Labs  04/02/15 0455  LABPROT 15.9*  INR 1.26   ABG  Recent Labs  04/01/15 0401  PHART 7.425  HCO3 21.2    Studies/Results: Dg Chest Port 1 View  04/03/2015   CLINICAL DATA:  Respiratory acidosis.  EXAM: PORTABLE CHEST - 1 VIEW  COMPARISON:  04/02/2015.  FINDINGS: Interim extubation. NG tube and left IJ line in stable position. Stable cardiomegaly. Persistent but improving right lower lobe infiltrate. No pneumothorax. No acute bony abnormality .  IMPRESSION: 1. Interim extubation.  NG tube and left IJ line in stable position. 2. Persistent but improving right lower lobe infiltrate. 3. Stable cardiomegaly.   Electronically Signed   By: Marcello Moores  Register   On: 04/03/2015 07:23   Dg Chest Port 1 View  04/02/2015   CLINICAL DATA:  Hypoxia  EXAM: PORTABLE CHEST - 1  VIEW  COMPARISON:  April 01, 2015  FINDINGS: Endotracheal tube tip is 4.7 cm above the carina. Central catheter tip is in the superior vena cava just beyond the junction with the right atrium. Nasogastric tube tip is at the gastroesophageal junction with the side port slightly above the gastroesophageal junction. No pneumothorax. There is patchy infiltrate in both lung bases with left lower lobe consolidation. Infiltrate is more patchy on the right. Heart is upper normal in size with pulmonary vascularity within normal limits. No adenopathy.  IMPRESSION: Airspace disease in both lower lobes with consolidation in the left base and more patchy infiltrate in the right base. These changes are stable. No change in cardiac silhouette. Tube and catheter positions are unchanged without pneumothorax. Note that the nasogastric tube side port is above the gastroesophageal junction. It may be prudent to advance nasogastric tube 8-10 cm to insure that both nasogastric tube tip and side port are well within the stomach.   Electronically Signed   By: Lowella Grip III M.D.   On: 04/02/2015 06:59   Dg Abd Portable 1v  04/02/2015   CLINICAL DATA:  NG tube placement  EXAM: PORTABLE ABDOMEN - 1 VIEW  COMPARISON:  None.  FINDINGS: NG tube projects over the stomach. Numerous mildly distended loops of small bowel. Gas is seen into the rectum.  IMPRESSION: NG tube over the stomach.  Electronically Signed   By: Skipper Cliche M.D.   On: 04/02/2015 23:48    Anti-infectives: Anti-infectives    Start     Dose/Rate Route Frequency Ordered Stop   04/02/15 1200  cefTRIAXone (ROCEPHIN) 2 g in dextrose 5 % 50 mL IVPB     2 g 100 mL/hr over 30 Minutes Intravenous Every 24 hours 04/02/15 0955     03/31/15 2200  vancomycin (VANCOCIN) IVPB 750 mg/150 ml premix  Status:  Discontinued     750 mg 150 mL/hr over 60 Minutes Intravenous Every 12 hours 03/31/15 0929 04/01/15 0839   03/31/15 1000  vancomycin (VANCOCIN) 2,000 mg in  sodium chloride 0.9 % 500 mL IVPB     2,000 mg 250 mL/hr over 120 Minutes Intravenous  Once 03/31/15 0927 03/31/15 1237   03/31/15 1000  piperacillin-tazobactam (ZOSYN) IVPB 3.375 g  Status:  Discontinued     3.375 g 12.5 mL/hr over 240 Minutes Intravenous Every 8 hours 03/31/15 0927 04/02/15 0955   03/29/15 1000  cefoTEtan (CEFOTAN) 2 g in dextrose 5 % 50 mL IVPB     2 g 100 mL/hr over 30 Minutes Intravenous To Surgery 03/29/15 0957 03/29/15 1012      Assessment/Plan: s/p Procedure(s): EXPLORATORY LAP WITH TOTAL COLECTOMY (N/A) CYSTOSCOPY WITH PLACEMENT OF 18 FRENCH COUDE CATHETER. (N/A) POD 5, s/p subtotal abdominal colectomy for massive LGI bleed - cont TFs through NGT at 20cc/h until bowel function improves -appreciate WOC assistance ABL anemia -mostly from fluid shift equilibration but with Hb<7 will give 1u PRBC AKI   -resolved DVT prophylaxis -SCDs/Lovenox    LOS: 7 days    Green Quincy E 04/03/2015

## 2015-04-04 ENCOUNTER — Inpatient Hospital Stay (HOSPITAL_COMMUNITY): Payer: Medicare (Managed Care)

## 2015-04-04 LAB — BASIC METABOLIC PANEL
Anion gap: 13 (ref 5–15)
BUN: 6 mg/dL (ref 6–20)
CHLORIDE: 105 mmol/L (ref 101–111)
CO2: 26 mmol/L (ref 22–32)
CREATININE: 1.07 mg/dL (ref 0.61–1.24)
Calcium: 7.8 mg/dL — ABNORMAL LOW (ref 8.9–10.3)
GFR calc non Af Amer: >60 mL/min (ref >60)
Glucose, Bld: 123 mg/dL — ABNORMAL HIGH (ref 65–99)
POTASSIUM: 3.7 mmol/L (ref 3.5–5.1)
Sodium: 144 mmol/L (ref 135–145)

## 2015-04-04 LAB — CBC WITH DIFFERENTIAL/PLATELET
BASOS PCT: 0 % (ref 0–1)
Basophils Absolute: 0 10*3/uL (ref 0.0–0.1)
Eosinophils Absolute: 0.1 10*3/uL (ref 0.0–0.7)
Eosinophils Relative: 1 % (ref 0–5)
HEMATOCRIT: 30.8 % — AB (ref 39.0–52.0)
HEMOGLOBIN: 10.2 g/dL — AB (ref 13.0–17.0)
LYMPHS ABS: 0.9 10*3/uL (ref 0.7–4.0)
Lymphocytes Relative: 7 % — ABNORMAL LOW (ref 12–46)
MCH: 29.9 pg (ref 26.0–34.0)
MCHC: 33.1 g/dL (ref 30.0–36.0)
MCV: 90.3 fL (ref 78.0–100.0)
MONOS PCT: 11 % (ref 3–12)
Monocytes Absolute: 1.3 10*3/uL — ABNORMAL HIGH (ref 0.1–1.0)
NEUTROS ABS: 10 10*3/uL — AB (ref 1.7–7.7)
NEUTROS PCT: 81 % — AB (ref 43–77)
Platelets: 344 10*3/uL (ref 150–400)
RBC: 3.41 MIL/uL — ABNORMAL LOW (ref 4.22–5.81)
RDW: 14.9 % (ref 11.5–15.5)
WBC: 12.3 10*3/uL — ABNORMAL HIGH (ref 4.0–10.5)

## 2015-04-04 LAB — TYPE AND SCREEN
ABO/RH(D): O POS
ANTIBODY SCREEN: NEGATIVE
Unit division: 0
Unit division: 0
Unit division: 0

## 2015-04-04 LAB — GLUCOSE, CAPILLARY
GLUCOSE-CAPILLARY: 112 mg/dL — AB (ref 65–99)
Glucose-Capillary: 111 mg/dL — ABNORMAL HIGH (ref 65–99)
Glucose-Capillary: 116 mg/dL — ABNORMAL HIGH (ref 65–99)
Glucose-Capillary: 122 mg/dL — ABNORMAL HIGH (ref 65–99)
Glucose-Capillary: 141 mg/dL — ABNORMAL HIGH (ref 65–99)
Glucose-Capillary: 168 mg/dL — ABNORMAL HIGH (ref 65–99)
Glucose-Capillary: 62 mg/dL — ABNORMAL LOW (ref 65–99)

## 2015-04-04 MED ORDER — WHITE PETROLATUM GEL
Status: AC
  Filled 2015-04-04: qty 1

## 2015-04-04 MED ORDER — VANCOMYCIN HCL 10 G IV SOLR
1250.0000 mg | Freq: Two times a day (BID) | INTRAVENOUS | Status: DC
  Administered 2015-04-04 – 2015-04-05 (×4): 1250 mg via INTRAVENOUS
  Filled 2015-04-04 (×5): qty 1250

## 2015-04-04 MED ORDER — DEXTROSE 50 % IV SOLN
1.0000 | Freq: Once | INTRAVENOUS | Status: AC
  Administered 2015-04-04: 50 mL via INTRAVENOUS
  Filled 2015-04-04: qty 50

## 2015-04-04 MED ORDER — PIPERACILLIN-TAZOBACTAM 3.375 G IVPB
3.3750 g | Freq: Three times a day (TID) | INTRAVENOUS | Status: DC
  Administered 2015-04-04 – 2015-04-06 (×6): 3.375 g via INTRAVENOUS
  Filled 2015-04-04 (×7): qty 50

## 2015-04-04 MED ORDER — HYDRALAZINE HCL 20 MG/ML IJ SOLN
10.0000 mg | Freq: Four times a day (QID) | INTRAMUSCULAR | Status: DC | PRN
  Administered 2015-04-08: 10 mg via INTRAVENOUS
  Filled 2015-04-04: qty 1

## 2015-04-04 NOTE — Progress Notes (Signed)
Pt transferred from 28M to 3s12 after report received from RN.  Pt is currently in restraints, confused, but follows commands.  Pt oriented to room and surroundings with call bell placed within reach.  Will continue to monitor.

## 2015-04-04 NOTE — Consult Note (Signed)
WOC visited with patient today in stepdown unit.  Attempted to discuss recent bowel surgery with patient and creation of stoma. Pt has baseline dx of dementia.  At my last several visits he has been intubated.  I did meet with his wife one time and explain my role and provided her with ostomy educational materials.  Discussed with bedside nurse today and baseline patient will most likely not be able to care for ostomy independently if at all.  Reviewed SW notes and wife is hoping for SNF for care.  I will forgo any teaching with patient at this time but continue to assess for changes weekly in mental status.  Supplies at bedside for nurses for 2x wk ostomy pouch changes.  Patient up in the chair at the time of my visit today, offered assistance if needed with pouch change, however bedside nurses ok to change once he is back in the bed.   WOC will follow along with you for ostomy support and education when and if needed. [name], Jesse

## 2015-04-04 NOTE — Progress Notes (Signed)
Patient ID: Jesse [name], male   DOB: 08/03/1940, 74 y.o.   MRN: [id number]     [facility]      [address] [place], [place] [address]    Phone: [phone] FAX: [phone]     Subjective: Pt confused.  WBC up.  Afebrile.  Apparently pulled out NGT, replaced, TF started and noted to have 557m residual.  Not sure duration of TF, but 306mintake recorded.  Objective:  Vital signs:  Filed Vitals:   04/04/15 0440 04/04/15 0500 04/04/15 0505 04/04/15 0902  BP: 187/90  186/95 177/94  Pulse: 100  104   Temp:   98.3 F (36.8 C) 98.8 F (37.1 C)  TempSrc:   Oral Axillary  Resp: 17  18   Height:      Weight:  100.7 kg (222 lb 0.1 oz)    SpO2: 100%  100%     Last BM Date: 04/01/15  Intake/Output   Yesterday:  09/08 0701 - 09/09 0700 In: 527.8 [I.V.:227.8; NG/GT:300] Out: 521700 [FVCBS:4967Stool:600] This shift:  Total I/O In: -  Out: 325 [Urine:325]  Physical Exam: General: Pt awake/alert/oriented x to person only.  Abdomen: Soft.  Nondistended.  Non tender.  Incisions is clean, dressing is dry,staples in place.  LLQ ostomy is functioning.     Problem List:   Principal Problem:   Acute GI bleeding Active Problems:   HTN (hypertension)   Diabetes mellitus type 2, controlled   Dementia   Hiatal hernia   History of diverticulitis   History of prostate cancer   Acute blood loss anemia   Acute renal failure   GI bleeding   Hematochezia   Acute respiratory failure with hypoxia   History of diverticulosis   Aspiration pneumonia    Results:   Labs: Results for orders placed or performed during the hospital encounter of 03/27/15 (from the past 48 hour(s))  Glucose, capillary     Status: Abnormal   Collection Time: 04/02/15 11:35 AM  Result Value Ref Range   Glucose-Capillary 143 (H) 65 - 99 mg/dL  Glucose, capillary     Status: Abnormal   Collection Time: 04/02/15  3:54 PM  Result Value Ref Range    Glucose-Capillary 121 (H) 65 - 99 mg/dL  Glucose, capillary     Status: Abnormal   Collection Time: 04/02/15  7:39 PM  Result Value Ref Range   Glucose-Capillary 117 (H) 65 - 99 mg/dL  Glucose, capillary     Status: Abnormal   Collection Time: 04/02/15 11:37 PM  Result Value Ref Range   Glucose-Capillary 125 (H) 65 - 99 mg/dL   Comment 1 Notify RN    Comment 2 Document in Chart   Basic metabolic panel     Status: Abnormal   Collection Time: 04/03/15  3:17 AM  Result Value Ref Range   Sodium 141 135 - 145 mmol/L   Potassium 3.6 3.5 - 5.1 mmol/L   Chloride 111 101 - 111 mmol/L   CO2 25 22 - 32 mmol/L   Glucose, Bld 140 (H) 65 - 99 mg/dL   BUN 11 6 - 20 mg/dL   Creatinine, Ser 0.97 0.61 - 1.24 mg/dL   Calcium 8.0 (L) 8.9 - 10.3 mg/dL   GFR calc non Af Amer >60 >60 mL/min   GFR calc Af Amer >60 >60 mL/min    Comment: (NOTE) The eGFR has been calculated using the CKD EPI equation. This calculation has not been  validated in all clinical situations. eGFR's persistently <60 mL/min signify possible Chronic Kidney Disease.    Anion gap 5 5 - 15  CBC with Differential/Platelet     Status: Abnormal   Collection Time: 04/03/15  3:17 AM  Result Value Ref Range   WBC 7.2 4.0 - 10.5 K/uL   RBC 2.34 (L) 4.22 - 5.81 MIL/uL   Hemoglobin 6.7 (LL) 13.0 - 17.0 g/dL    Comment: REPEATED TO VERIFY CRITICAL RESULT CALLED TO, READ BACK BY AND VERIFIED WITH: S MYERS,RN [id number] WILDERK    HCT 20.5 (L) 39.0 - 52.0 %   MCV 90.2 78.0 - 100.0 fL   MCH 29.5 26.0 - 34.0 pg   MCHC 32.7 30.0 - 36.0 g/dL   RDW 15.1 11.5 - 15.5 %   Platelets 212 150 - 400 K/uL   Neutrophils Relative % 70 43 - 77 %   Neutro Abs 5.0 1.7 - 7.7 K/uL   Lymphocytes Relative 14 12 - 46 %   Lymphs Abs 1.0 0.7 - 4.0 K/uL   Monocytes Relative 13 (H) 3 - 12 %   Monocytes Absolute 0.9 0.1 - 1.0 K/uL   Eosinophils Relative 4 0 - 5 %   Eosinophils Absolute 0.3 0.0 - 0.7 K/uL   Basophils Relative 0 0 - 1 %   Basophils  Absolute 0.0 0.0 - 0.1 K/uL  Glucose, capillary     Status: Abnormal   Collection Time: 04/03/15  3:24 AM  Result Value Ref Range   Glucose-Capillary 135 (H) 65 - 99 mg/dL   Comment 1 Notify RN    Comment 2 Document in Chart   Prepare RBC     Status: None   Collection Time: 04/03/15  3:56 AM  Result Value Ref Range   Order Confirmation ORDER PROCESSED BY BLOOD BANK   CBC     Status: Abnormal   Collection Time: 04/03/15  8:02 AM  Result Value Ref Range   WBC 8.5 4.0 - 10.5 K/uL   RBC 2.76 (L) 4.22 - 5.81 MIL/uL   Hemoglobin 8.2 (L) 13.0 - 17.0 g/dL   HCT 25.1 (L) 39.0 - 52.0 %   MCV 90.9 78.0 - 100.0 fL   MCH 29.7 26.0 - 34.0 pg   MCHC 32.7 30.0 - 36.0 g/dL   RDW 15.1 11.5 - 15.5 %   Platelets 222 150 - 400 K/uL  Glucose, capillary     Status: Abnormal   Collection Time: 04/03/15  8:11 AM  Result Value Ref Range   Glucose-Capillary 102 (H) 65 - 99 mg/dL  Glucose, capillary     Status: Abnormal   Collection Time: 04/03/15 12:19 PM  Result Value Ref Range   Glucose-Capillary 116 (H) 65 - 99 mg/dL  Glucose, capillary     Status: Abnormal   Collection Time: 04/03/15  3:26 PM  Result Value Ref Range   Glucose-Capillary 100 (H) 65 - 99 mg/dL  Glucose, capillary     Status: None   Collection Time: 04/03/15  8:07 PM  Result Value Ref Range   Glucose-Capillary 85 65 - 99 mg/dL  Glucose, capillary     Status: Abnormal   Collection Time: 04/03/15 11:59 PM  Result Value Ref Range   Glucose-Capillary 62 (L) 65 - 99 mg/dL   Comment 1 Notify RN   Glucose, capillary     Status: Abnormal   Collection Time: 04/04/15  1:17 AM  Result Value Ref Range   Glucose-Capillary 168 (H) 65 - 99  mg/dL   Comment 1 Notify RN   CBC with Differential/Platelet     Status: Abnormal   Collection Time: 04/04/15  4:30 AM  Result Value Ref Range   WBC 12.3 (H) 4.0 - 10.5 K/uL   RBC 3.41 (L) 4.22 - 5.81 MIL/uL   Hemoglobin 10.2 (L) 13.0 - 17.0 g/dL   HCT 30.8 (L) 39.0 - 52.0 %   MCV 90.3 78.0 - 100.0  fL   MCH 29.9 26.0 - 34.0 pg   MCHC 33.1 30.0 - 36.0 g/dL   RDW 14.9 11.5 - 15.5 %   Platelets 344 150 - 400 K/uL   Neutrophils Relative % 81 (H) 43 - 77 %   Neutro Abs 10.0 (H) 1.7 - 7.7 K/uL   Lymphocytes Relative 7 (L) 12 - 46 %   Lymphs Abs 0.9 0.7 - 4.0 K/uL   Monocytes Relative 11 3 - 12 %   Monocytes Absolute 1.3 (H) 0.1 - 1.0 K/uL   Eosinophils Relative 1 0 - 5 %   Eosinophils Absolute 0.1 0.0 - 0.7 K/uL   Basophils Relative 0 0 - 1 %   Basophils Absolute 0.0 0.0 - 0.1 K/uL  Basic metabolic panel     Status: Abnormal   Collection Time: 04/04/15  4:30 AM  Result Value Ref Range   Sodium 144 135 - 145 mmol/L   Potassium 3.7 3.5 - 5.1 mmol/L   Chloride 105 101 - 111 mmol/L   CO2 26 22 - 32 mmol/L   Glucose, Bld 123 (H) 65 - 99 mg/dL   BUN 6 6 - 20 mg/dL   Creatinine, Ser 1.07 0.61 - 1.24 mg/dL   Calcium 7.8 (L) 8.9 - 10.3 mg/dL   GFR calc non Af Amer >60 >60 mL/min   GFR calc Af Amer >60 >60 mL/min    Comment: (NOTE) The eGFR has been calculated using the CKD EPI equation. This calculation has not been validated in all clinical situations. eGFR's persistently <60 mL/min signify possible Chronic Kidney Disease.    Anion gap 13 5 - 15  Glucose, capillary     Status: Abnormal   Collection Time: 04/04/15  5:04 AM  Result Value Ref Range   Glucose-Capillary 112 (H) 65 - 99 mg/dL  Glucose, capillary     Status: Abnormal   Collection Time: 04/04/15  8:22 AM  Result Value Ref Range   Glucose-Capillary 122 (H) 65 - 99 mg/dL    Imaging / Studies: Dg Chest Port 1 View  04/04/2015   CLINICAL DATA:  Rhonchi with concern for aspiration  EXAM: PORTABLE CHEST - 1 VIEW  COMPARISON:  April 03, 2015  FINDINGS: Central catheter tip is in the superior vena cava. Nasogastric tube tip and side port below the diaphragm. No pneumothorax. There is consolidation in the medial left base. There is patchy infiltrate in the right lower lobe, essentially stable allowing for some difference in  the degree of inspiration. Lungs elsewhere clear. Heart size and pulmonary vascularity are normal. No adenopathy.  IMPRESSION: Consolidation medial left base. Patchy infiltrate right lower lobe. Aspiration is a differential consideration given these findings. Lungs elsewhere clear. Tube and catheter positions as described without pneumothorax. No change in cardiac silhouette.   Electronically Signed   By: Lowella Grip III M.D.   On: 04/04/2015 09:08   Dg Chest Port 1 View  04/03/2015   CLINICAL DATA:  Respiratory acidosis.  EXAM: PORTABLE CHEST - 1 VIEW  COMPARISON:  04/02/2015.  FINDINGS: Interim extubation.  NG tube and left IJ line in stable position. Stable cardiomegaly. Persistent but improving right lower lobe infiltrate. No pneumothorax. No acute bony abnormality .  IMPRESSION: 1. Interim extubation.  NG tube and left IJ line in stable position. 2. Persistent but improving right lower lobe infiltrate. 3. Stable cardiomegaly.   Electronically Signed   By: Marcello Moores  Register   On: 04/03/2015 07:23   Dg Abd Portable 1v  04/04/2015   CLINICAL DATA:  Abdominal distention  EXAM: PORTABLE ABDOMEN - 1 VIEW  COMPARISON:  Study obtained earlier in the day  FINDINGS: Nasogastric tube tip and side port are in the stomach. There is moderate generalized bowel dilatation. No air-fluid levels are appreciated. There are multiple surgical clips present. No free air is appreciable on this supine examination.  IMPRESSION: Generalized bowel dilatation in a pattern most suggestive of ileus. No free air appreciable on this supine examination. Nasogastric tube tip and side port in stomach.   Electronically Signed   By: Lowella Grip III M.D.   On: 04/04/2015 09:10   Dg Abd Portable 1v  04/04/2015   CLINICAL DATA:  74 year old male status post enteric tube placement. Confirm positioning.  EXAM: PORTABLE ABDOMEN - 1 VIEW  COMPARISON:  Radiograph dated 04/03/2015  FINDINGS: An enteric tube is partially visualized with tip  over the gastric bubble. There is better distention of the stomach. Multiple dilated air-filled loops of small bowel noted. Right upper quadrant cholecystectomy clips. Surgical clips noted over the midline abdomen.  IMPRESSION: Enteric tube with tip over the gastric bubble.   Electronically Signed   By: Anner Crete M.D.   On: 04/04/2015 01:32   Dg Abd Portable 1v  04/03/2015   CLINICAL DATA:  74 year old male status post enteric tube placement. Initial encounter. Recent exploratory laparotomy with total colectomy for lower GI bleeding.  EXAM: PORTABLE ABDOMEN - 1 VIEW  COMPARISON:  04/02/2015.  FINDINGS: Portable AP supine view at 1504 hours. Enteric tube courses to the left upper quadrant. The tip projects over the the proximal gastric air. The side hole is not identified.  Mild motion artifact. Midline abdominal skin staples and right upper quadrant surgical clips again noted. Stable gas pattern with multiple gas-filled small bowel loops in the mid abdomen. Increased density at the left lung base. See chest radiograph from today reported separately.  IMPRESSION: 1. Enteric tube tip terminates in the stomach. 2. Multiple gas-filled small bowel loops in the upper abdomen may indicate ileus.   Electronically Signed   By: Genevie Ann M.D.   On: 04/03/2015 15:13   Dg Abd Portable 1v  04/02/2015   CLINICAL DATA:  NG tube placement  EXAM: PORTABLE ABDOMEN - 1 VIEW  COMPARISON:  None.  FINDINGS: NG tube projects over the stomach. Numerous mildly distended loops of small bowel. Gas is seen into the rectum.  IMPRESSION: NG tube over the stomach.   Electronically Signed   By: Skipper Cliche M.D.   On: 04/02/2015 23:48    Medications / Allergies:  Scheduled Meds: . sodium chloride   Intravenous Once  . antiseptic oral rinse  7 mL Mouth Rinse q12n4p  . antiseptic oral rinse  7 mL Mouth Rinse QID  . cefTRIAXone (ROCEPHIN)  IV  2 g Intravenous Q24H  . chlorhexidine  15 mL Mouth Rinse BID  . enoxaparin (LOVENOX)  injection  40 mg Subcutaneous Q24H  . feeding supplement (VITAL HIGH PROTEIN)  1,000 mL Per Tube Q24H  . furosemide  20 mg Intravenous  Q12H  . Influenza vac split quadrivalent PF  0.5 mL Intramuscular Tomorrow-1000  . insulin aspart  0-9 Units Subcutaneous 6 times per day  . pantoprazole (PROTONIX) IV  40 mg Intravenous Q24H  . potassium phosphate IVPB (mmol)  30 mmol Intravenous Once  . rocuronium  1 mg/kg Intravenous Once  . sodium chloride  3 mL Intravenous Q12H  . THROMBI-PAD  1 each Topical Once   Continuous Infusions: . sodium chloride 10 mL/hr at 04/04/15 0019  . dexmedetomidine Stopped (04/03/15 0746)  . dextrose 5% lactated ringers Stopped (03/31/15 0857)  . fentaNYL infusion INTRAVENOUS Stopped (04/02/15 0951)  . norepinephrine (LEVOPHED) Adult infusion    . phenylephrine (NEO-SYNEPHRINE) Adult infusion Stopped (04/02/15 0951)   PRN Meds:.acetaminophen **OR** acetaminophen, fentaNYL (SUBLIMAZE) injection, haloperidol lactate, ondansetron **OR** ondansetron (ZOFRAN) IV  Antibiotics: Anti-infectives    Start     Dose/Rate Route Frequency Ordered Stop   04/02/15 1200  cefTRIAXone (ROCEPHIN) 2 g in dextrose 5 % 50 mL IVPB     2 g 100 mL/hr over 30 Minutes Intravenous Every 24 hours 04/02/15 0955     03/31/15 2200  vancomycin (VANCOCIN) IVPB 750 mg/150 ml premix  Status:  Discontinued     750 mg 150 mL/hr over 60 Minutes Intravenous Every 12 hours 03/31/15 0929 04/01/15 0839   03/31/15 1000  vancomycin (VANCOCIN) 2,000 mg in sodium chloride 0.9 % 500 mL IVPB     2,000 mg 250 mL/hr over 120 Minutes Intravenous  Once 03/31/15 0927 03/31/15 1237   03/31/15 1000  piperacillin-tazobactam (ZOSYN) IVPB 3.375 g  Status:  Discontinued     3.375 g 12.5 mL/hr over 240 Minutes Intravenous Every 8 hours 03/31/15 0927 04/02/15 0955   03/29/15 1000  cefoTEtan (CEFOTAN) 2 g in dextrose 5 % 50 mL IVPB     2 g 100 mL/hr over 30 Minutes Intravenous To Surgery 03/29/15 0957 03/29/15 1012         Assessment/Plan POD#6 exploratory laparotomy with total colectomy---Dr. Rosendo Gros -hold TF for now, check residuals q4h.  If >350 will place back to suction.  Ostomy is functioning.  ID-likely aspirated, would recommend starting atbx.  Doubt abdomen is a source, will discuss with my attending whether a CT is warranted to r/o post op abscess. VTE prophylaxis-SCD/lovenox FEN-hold TF.  Would hold off on on SLP eval until medically stable.   Erby Pian, [facility] Pager [phone]) For consults and floor pages call [phone](7A-4:30P)  04/04/2015 9:33 AM

## 2015-04-04 NOTE — Care Management Note (Signed)
Case Management Note  Patient Details  Name: Jesse [name] MRN: [id number] Date of Birth: July 17, 1941  Subjective/Objective:          Checked to see if patient has [place] benefits.  Does NOT.  Wife on board for rehab prior to dc - SW consult placed.           Action/Plan:   Expected Discharge Date:                  Expected Discharge Plan:  Skilled Nursing Facility  In-House Referral:  Clinical Social Work  Discharge planning Services  CM Consult  Post Acute Care Choice:    Choice offered to:     DME Arranged:    DME Agency:     HH Arranged:    [place] Agency:     Status of Service:  In process, will continue to follow  Medicare Important Message Given:  Yes-second notification given Date Medicare IM Given:    Medicare IM give by:    Date Additional Medicare IM Given:    Additional Medicare Important Message give by:     If discussed at [place] of Stay Meetings, dates discussed:    Additional Comments:  Vergie Living, RN 04/04/2015, 9:01 AM

## 2015-04-04 NOTE — Clinical Social Work Note (Signed)
Clinical Social Work Assessment  Patient Details  Name: Jesse [name] MRN: [id number] Date of Birth: Apr 11, 1941  Date of referral:  04/04/15               Reason for consult:  Facility Placement                Permission sought to share information with:  Family Supports Permission granted to share information::     Name::     Palmer,[name]   Relationship::  wife    Housing/Transportation Living arrangements for the past 2 months:  Apartment Source of Information:  Spouse Patient Interpreter Needed:  None Criminal Activity/Legal Involvement Pertinent to Current Situation/Hospitalization:  No - Comment as needed Significant Relationships:  Spouse Lives with:  Spouse Do you feel safe going back to the place where you live?  No Need for family participation in patient care:  Yes (Comment)  Care giving concerns: [name] reported that she can not take care of the pt at home.   Social Worker assessment / plan: CSW spoke with the wife. CSW introduced self and purpose of the call. CSW discussed SNF rehab. CSW explained the SNF process. CSW explained insurance and its relation to SNF placement. CSW explained that insurance does not have contact with SNF in [facility] due to it being an out of [employer]. CSW explained the LOG process. CSW answered all questions in which the pt inquired about. CSW will continue to follow this pt and assist with discharge as needed.   Employment status:  Retired Nurse, adult PT Recommendations:  [place] / Referral to community resources:  [place]  Patient/Family's Response to care:  [name] reported that the care in which the pt has received has been well.   Patient/Family's Understanding of and Emotional Response to Diagnosis, Current Treatment, and Prognosis: [name] acknowledged that pt's current condition. [name] hopeful about getting the pt into a SNF.     Emotional Assessment Appearance:   (Unable to Assess) Attitude/Demeanor/Rapport:  Unable to Assess Affect (typically observed):  Unable to Assess Orientation:  Oriented to Self Alcohol / Substance use:  Not Applicable Psych involvement (Current and /or in the community):  No (Comment)  Discharge Needs  Concerns to be addressed:  Denies Needs/Concerns at this time Readmission within the last 30 days:  No Current discharge risk:  None Barriers to Discharge:  Other (Outside insurance )   Floree Zuniga, LCSW 04/04/2015, 12:10 PM

## 2015-04-04 NOTE — Clinical Social Work Placement (Signed)
   CLINICAL SOCIAL WORK PLACEMENT  NOTE  Date:  04/04/2015  Patient Details  Name: Jesse [name] MRN: [id number] Date of Birth: 23-Sep-1940  Clinical Social Work is seeking post-discharge placement for this patient at the [place] Level level of care (*CSW will initial, date and re-position this form in  chart as items are completed):  Yes   Patient/family provided with [place] Work Department's list of facilities offering this level of care within the geographic area requested by the patient (or if unable, by the patient's family).  Yes   Patient/family informed of their freedom to choose among providers that offer the needed level of care, that participate in Medicare, Medicaid or managed care program needed by the patient, have an available bed and are willing to accept the patient.  Yes   Patient/family informed of [place]'s ownership interest in [facility] and [facility], as well as of the fact that they are under no obligation to receive care at these facilities.  PASRR submitted to EDS on 04/04/15     PASRR number received on 04/04/15     Existing PASRR number confirmed on       FL2 transmitted to all facilities in geographic area requested by pt/family on       FL2 transmitted to all facilities within larger geographic area on 04/04/15     Patient informed that his/her managed care company has contracts with or will negotiate with certain facilities, including the following:            Patient/family informed of bed offers received.  Patient chooses bed at       Physician recommends and patient chooses bed at      Patient to be transferred to   on  .  Patient to be transferred to facility by       Patient family notified on   of transfer.  Name of family member notified:        PHYSICIAN Please sign FL2     Additional Comment:    _______________________________________________ Greta Doom, LCSW 04/04/2015, 2:05  PM

## 2015-04-04 NOTE — Progress Notes (Signed)
Nutrition Follow-up  DOCUMENTATION CODES:   Obesity unspecified  INTERVENTION:    If unable to start enteral nutrition within the next 2-3 days, consider TPN.  NUTRITION DIAGNOSIS:   Inadequate oral intake related to altered GI function as evidenced by NPO status.  Ongoing  GOAL:   Patient will meet greater than or equal to 90% of their needs  Unmet  MONITOR:   Diet advancement, PO intake, Labs, Weight trends  REASON FOR ASSESSMENT:   Consult Enteral/tube feeding initiation and management  ASSESSMENT:   Patient admitted on 9/1 with acute lower GI bleed with hemorrhagic shock.  S/P subtotal abdominal colectomy for massive LGI bleed on 9/3. Now with RLQ end ileostomy. 600 ml output 9/8. Trickle TF was attempted, but patient with 500 ml residual, so TF currently on hold. Swallow evaluation also on hold for now.   Diet Order:  Diet NPO time specified  Skin:  Reviewed, no issues  Last BM:  ileostomy with 600 ml output on 9/8  Height:   Ht Readings from Last 1 Encounters:  04/03/15 5\' 11"  (1.803 m)    Weight:   Wt Readings from Last 1 Encounters:  04/04/15 222 lb 0.1 oz (100.7 kg)   03/27/15 222 lb 14.2 oz (101.1 kg)        Ideal Body Weight:  78.18 kg  BMI:  Body mass index is 30.98 kg/(m^2).  Estimated Nutritional Needs:   Kcal:  2200-2400  Protein:  120-140 gm  Fluid:  2.2-2.4 L  EDUCATION NEEDS:   No education needs identified at this time  Molli Barrows, [place], [place], [place] Pager [phone] After Hours Pager [phone]

## 2015-04-04 NOTE — Progress Notes (Signed)
Vancomycin infusing to RUA peripheral IV.  Found to be infiltrated; slight redness above insertion site.  IV D/Cd.  Pharmacy contacted; nothing specific to do for infiltration.  Remainder of vancomycin switched to Left Jugular TLC site.

## 2015-04-04 NOTE — Progress Notes (Signed)
Upon morning assessments pt left lung sounds with perfuse rhonchi and tube feed residual was found to be 417mls. Dr. Allyson Sabal called and notified. New orders received will implement and continue to monitor.

## 2015-04-04 NOTE — Progress Notes (Signed)
Physical Therapy Treatment Patient Details Name: Jesse [name] MRN: [id number] DOB: 11/04/1940 Today's Date: 04/04/2015    History of Present Illness Pt is a 74 y/o male with a PMH of dementia (wife reports sundowning), DMII, HTN, hep-C. Pt presents with GI bleed requiring emergent subtotal colectomy.     PT Comments    Pt with increased ability to participate with mobility today. Pt with improved sitting balance and able to transfer to recliner with +2 A and no LOB despite taking hands off of RW while taking steps.  Recommend SNF at this time due to pt's current level of function and that he was [place] this area from [place] when he had to be hospitalized.  Follow Up Recommendations  SNF (note from CM stating wife is interested in SNF)     Equipment Recommendations  Rolling walker with 5" wheels    Recommendations for Other Services       Precautions / Restrictions Precautions Precautions: Fall Precaution Comments: Colostomy, NG tube, Abd incision. 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 Needed 6 people to restrain him at one point. Restrictions Weight Bearing Restrictions: No    Mobility  Bed Mobility Overal bed mobility: Needs Assistance Bed Mobility: Rolling;Sidelying to Sit Rolling: Mod assist Sidelying to sit: Mod assist;+2 for physical assistance       General bed mobility comments: Multi-modal cueing for use of rail and technique.  Transfers Overall transfer level: Needs assistance Equipment used: Rolling walker (2 wheeled) Transfers: Sit to/from [facility] Sit to Stand: +2 physical assistance;Min assist Stand pivot transfers: Mod assist;+2 physical assistance       General transfer comment: Pt stood fairly well from elevated surface, but with transer to recliner, pt taking hands off of RW trying to fix gown, but no LOB.  Ambulation/Gait             General Gait Details: not attempted  today   Stairs            Wheelchair Mobility    Modified Rankin (Stroke Patients Only)       Balance Overall balance assessment: Needs assistance Sitting-balance support: No upper extremity supported;Feet supported Sitting balance-Leahy Scale: Fair Sitting balance - Comments: Pt able to sit with min/guard with Head tilted to the R, but trunk upright.   Standing balance support: Bilateral upper extremity supported Standing balance-Leahy Scale: Poor                      Cognition Arousal/Alertness: Awake/alert Behavior During Therapy: Flat affect Overall Cognitive Status: History of cognitive impairments - at baseline                      Exercises      General Comments        Pertinent Vitals/Pain Pain Assessment: Faces Faces Pain Scale: No hurt    Home Living                      Prior Function            PT Goals (current goals can now be found in the care plan section) Acute Rehab PT Goals Patient Stated Goal: Pt agreeable to get up to recliner. PT Goal Formulation: With patient Time For Goal Achievement: 04/16/15 Potential to Achieve Goals: Good Progress towards PT goals: Progressing toward goals    Frequency  Min 3X/week    PT Plan  Co-evaluation             End of Session Equipment Utilized During Treatment: Gait belt Activity Tolerance: Patient tolerated treatment well Patient left: in chair;with call bell/phone within reach;Other (comment) (nursing ordered mittens for pt)     Time: 8022-1798 PT Time Calculation (min) (ACUTE ONLY): 28 min  Charges:  $Therapeutic Activity: 23-37 mins                    G Codes:      Jesse [name] 04/04/2015, 11:03 AM

## 2015-04-04 NOTE — Progress Notes (Signed)
Pt NG tube dislodged and found beside pt.  Another NG tube placed with abd xray to confirm and TF restarted.  Pt also has PIV placed that is patent.  Notes state a plan to d/c central line, but no orders as of now.  Will continue to monitor

## 2015-04-04 NOTE — Care Management Important Message (Signed)
Important Message  Patient Details  Name: Jesse [name] MRN: [id number] Date of Birth: 07-09-1941   Medicare Important Message Given:  Yes-third notification given    Nathen May 04/04/2015, 11:49 AMImportant Message  Patient Details  Name: Jesse [name] MRN: [id number] Date of Birth: 24-Nov-1940   Medicare Important Message Given:  Yes-third notification given    Nathen May 04/04/2015, 11:49 AM

## 2015-04-04 NOTE — Progress Notes (Signed)
[facility] PROGRESS NOTE  Jesse [name] VPX:[id number] DOB: 02-Jun-1941 DOA: 03/27/2015 PCP: No primary care provider on file.  Assessment/Plan: Principal Problem:   Acute GI bleeding Active Problems:   HTN (hypertension)   Diabetes mellitus type 2, controlled   Dementia   Hiatal hernia   History of diverticulitis   History of prostate cancer   Acute blood loss anemia   Acute renal failure   GI bleeding   Hematochezia   Acute respiratory failure with hypoxia   History of diverticulosis   Aspiration pneumonia   HISTORY OF PRESENT ILLNESS:  Jesse [name] is a 74 y.o. male. 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 HTN. 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 Hiatal hernia on EGD ~ 2011. Diverticulosis on colonoscopy ~ 2011. GI bleed with hematochezia in 2015, wife not sure what the source was; did not undergo repeat endoscopic evaluation then. Being managed for massive GI bleed requiring emergent subtotal colectomy.  SUBJECTIVE: follows commands, confused   Assessment and plan #1 possible aspiration pneumonia, increase in white count from 8.5-12.3. Chest x-ray shows infiltrate in the right lower lobe, high volume of residual tube feeding as previous, aspiration certainly possible, start the patient on vancomycin and Zosyn. Patient has been receiving antibiotics since 9/4.  #2 acute lower GI bleeding suspected to be diverticula, status post subtotal colectomy, surgery following, currently nothing by mouth, probable ileus on KUB, hemoglobin stable,NGT to suction for postoperative ileus  #3 confusion-metabolic encephalopathy/ICU delirium, baseline dementia, avoid Haldol.  #4 hypertension-continue low-dose Lasix, start prn hydralazine  #5 hyperglycemia-continue sliding scale insulin, hold metformin   #6 disposition patient will need SNF placement  Code Status:      Code Status Orders        Start     Ordered   03/29/15 1937  Full  code   Continuous     03/29/15 1936     Family Communication: family updated about patient's clinical progress Disposition Plan:  As above     Consultants:  General surgery  Critical care   Antibiotics: Anti-infectives    Start     Dose/Rate Route Frequency Ordered Stop   04/04/15 1200  piperacillin-tazobactam (ZOSYN) IVPB 3.375 g     3.375 g 12.5 mL/hr over 240 Minutes Intravenous Every 8 hours 04/04/15 1019     04/04/15 1100  vancomycin (VANCOCIN) 1,250 mg in sodium chloride 0.9 % 250 mL IVPB     1,250 mg 166.7 mL/hr over 90 Minutes Intravenous Every 12 hours 04/04/15 1019     04/02/15 1200  cefTRIAXone (ROCEPHIN) 2 g in dextrose 5 % 50 mL IVPB  Status:  Discontinued     2 g 100 mL/hr over 30 Minutes Intravenous Every 24 hours 04/02/15 0955 04/04/15 0922   03/31/15 2200  vancomycin (VANCOCIN) IVPB 750 mg/150 ml premix  Status:  Discontinued     750 mg 150 mL/hr over 60 Minutes Intravenous Every 12 hours 03/31/15 0929 04/01/15 0839   03/31/15 1000  vancomycin (VANCOCIN) 2,000 mg in sodium chloride 0.9 % 500 mL IVPB     2,000 mg 250 mL/hr over 120 Minutes Intravenous  Once 03/31/15 0927 03/31/15 1237   03/31/15 1000  piperacillin-tazobactam (ZOSYN) IVPB 3.375 g  Status:  Discontinued     3.375 g 12.5 mL/hr over 240 Minutes Intravenous Every 8 hours 03/31/15 0927 04/02/15 0955   03/29/15 1000  cefoTEtan (CEFOTAN) 2 g in dextrose 5 % 50 mL  IVPB     2 g 100 mL/hr over 30 Minutes Intravenous To Surgery 03/29/15 0957 03/29/15 1012         HPI/Subjective: Called by RN this morning, for possible aspiration of tube the, residual>425cc , patient is confused  Objective: Filed Vitals:   04/04/15 0440 04/04/15 0500 04/04/15 0505 04/04/15 0902  BP: 187/90  186/95 177/94  Pulse: 100  104   Temp:   98.3 F (36.8 C) 98.8 F (37.1 C)  TempSrc:   Oral Axillary  Resp: 17  18   Height:      Weight:  100.7 kg (222 lb 0.1 oz)    SpO2: 100%  100%     Intake/Output Summary (Last  24 hours) at 04/04/15 1156 Last data filed at 04/04/15 0902  Gross per 24 hour  Intake    360 ml  Output   4870 ml  Net  -4510 ml    Exam:  General: folows commands Neuro: Followed commands, speaking about war HEENT: Line clean, jvd wnl Cardiovascular: s1 s2 RRR no  Lungs: CTA Abdomen: Soft, tender, non distended, normal bowel sound Skin: mild edema  Data Review   Micro Results Recent Results (from the past 240 hour(s))  MRSA PCR Screening     Status: None   Collection Time: 03/27/15  7:21 PM  Result Value Ref Range Status   MRSA by PCR NEGATIVE NEGATIVE Final    Comment:        The GeneXpert MRSA Assay (FDA approved for NASAL specimens only), is one component of a comprehensive MRSA colonization surveillance program. It is not intended to diagnose MRSA infection nor to guide or monitor treatment for MRSA infections.   Culture, respiratory (NON-Expectorated)     Status: None   Collection Time: 03/30/15  4:08 PM  Result Value Ref Range Status   Specimen Description TRACHEAL ASPIRATE  Final   Special Requests Normal  Final   Gram Stain   Final    ABUNDANT WBC PRESENT,BOTH PMN AND MONONUCLEAR NO SQUAMOUS EPITHELIAL CELLS SEEN MODERATE GRAM POSITIVE COCCI IN PAIRS RARE GRAM NEGATIVE RODS RARE GRAM POSITIVE COCCI    Culture   Final    MODERATE STREPTOCOCCUS PNEUMONIAE Performed at [facility]    Report Status 04/03/2015 FINAL  Final   Organism ID, Bacteria STREPTOCOCCUS PNEUMONIAE  Final      Susceptibility   Streptococcus pneumoniae - MIC (ETEST)*    CEFTRIAXONE 0.25 SENSITIVE Sensitive     LEVOFLOXACIN 0.75 SENSITIVE Sensitive     PENICILLIN 1.0 INTERMEDIATE Intermediate     * MODERATE STREPTOCOCCUS PNEUMONIAE  Culture, Urine     Status: None   Collection Time: 03/31/15 10:45 AM  Result Value Ref Range Status   Specimen Description URINE, CATHETERIZED  Final   Special Requests NONE  Final   Culture NO GROWTH 1 DAY  Final   Report Status  04/01/2015 FINAL  Final  Culture, blood (routine x 2)     Status: None (Preliminary result)   Collection Time: 03/31/15 10:55 AM  Result Value Ref Range Status   Specimen Description BLOOD LEFT THUMB  Final   Special Requests IN PEDIATRIC BOTTLE 1CC  Final   Culture NO GROWTH 3 DAYS  Final   Report Status PENDING  Incomplete  Culture, respiratory (NON-Expectorated)     Status: None   Collection Time: 04/01/15  8:56 AM  Result Value Ref Range Status   Specimen Description TRACHEAL ASPIRATE  Final   Special Requests NONE  Final   Gram Stain   Final    ABUNDANT WBC PRESENT,BOTH PMN AND MONONUCLEAR NO SQUAMOUS EPITHELIAL CELLS SEEN ABUNDANT GRAM POSITIVE COCCI IN PAIRS Performed at [facility]    Culture   Final    NO GROWTH 2 DAYS Performed at [facility]    Report Status 04/03/2015 FINAL  Final    Radiology Reports Dg Chest Port 1 View  04/04/2015   CLINICAL DATA:  Rhonchi with concern for aspiration  EXAM: PORTABLE CHEST - 1 VIEW  COMPARISON:  April 03, 2015  FINDINGS: Central catheter tip is in the superior vena cava. Nasogastric tube tip and side port below the diaphragm. No pneumothorax. There is consolidation in the medial left base. There is patchy infiltrate in the right lower lobe, essentially stable allowing for some difference in the degree of inspiration. Lungs elsewhere clear. Heart size and pulmonary vascularity are normal. No adenopathy.  IMPRESSION: Consolidation medial left base. Patchy infiltrate right lower lobe. Aspiration is a differential consideration given these findings. Lungs elsewhere clear. Tube and catheter positions as described without pneumothorax. No change in cardiac silhouette.   Electronically Signed   By: Lowella Grip III M.D.   On: 04/04/2015 09:08   Dg Chest Port 1 View  04/03/2015   CLINICAL DATA:  Respiratory acidosis.  EXAM: PORTABLE CHEST - 1 VIEW  COMPARISON:  04/02/2015.  FINDINGS: Interim extubation. NG tube and left  IJ line in stable position. Stable cardiomegaly. Persistent but improving right lower lobe infiltrate. No pneumothorax. No acute bony abnormality .  IMPRESSION: 1. Interim extubation.  NG tube and left IJ line in stable position. 2. Persistent but improving right lower lobe infiltrate. 3. Stable cardiomegaly.   Electronically Signed   By: Marcello Moores  Register   On: 04/03/2015 07:23   Dg Chest Port 1 View  04/02/2015   CLINICAL DATA:  Hypoxia  EXAM: PORTABLE CHEST - 1 VIEW  COMPARISON:  April 01, 2015  FINDINGS: Endotracheal tube tip is 4.7 cm above the carina. Central catheter tip is in the superior vena cava just beyond the junction with the right atrium. Nasogastric tube tip is at the gastroesophageal junction with the side port slightly above the gastroesophageal junction. No pneumothorax. There is patchy infiltrate in both lung bases with left lower lobe consolidation. Infiltrate is more patchy on the right. Heart is upper normal in size with pulmonary vascularity within normal limits. No adenopathy.  IMPRESSION: Airspace disease in both lower lobes with consolidation in the left base and more patchy infiltrate in the right base. These changes are stable. No change in cardiac silhouette. Tube and catheter positions are unchanged without pneumothorax. Note that the nasogastric tube side port is above the gastroesophageal junction. It may be prudent to advance nasogastric tube 8-10 cm to insure that both nasogastric tube tip and side port are well within the stomach.   Electronically Signed   By: Lowella Grip III M.D.   On: 04/02/2015 06:59   Dg Chest Port 1 View  04/01/2015   CLINICAL DATA:  Intubation.  EXAM: PORTABLE CHEST - 1 VIEW  COMPARISON:  03/31/2015.  FINDINGS: Endotracheal tube, NG tube, left IJ line in stable position. Stable cardiomegaly. Low lung volumes with bilateral lower lobe atelectasis and/or infiltrates. No pleural effusion or pneumothorax.  IMPRESSION: 1. Lines and tubes in stable  position. 2. Low lung volumes with bibasilar atelectasis and/or infiltrates. 3. Stable cardiomegaly.   Electronically Signed   By: Marcello Moores  Register   On:  04/01/2015 07:17   Dg Chest Port 1 View  03/31/2015   CLINICAL DATA:  74 year old male with acute GI bleeding, intubated.  EXAM: PORTABLE CHEST - 1 VIEW  COMPARISON:  03/30/2015 and earlier.  FINDINGS: Portable AP semi upright view at 0535 hours. Stable endotracheal tube tip at the level the clavicles. Enteric tube courses to the left upper quadrant, tip not included. Stable left IJ central line. Increased veiling opacity right greater than left with increased lower lobe opacity and obscuration of the diaphragm. Stable cardiac size and mediastinal contours. Upper lobes are stable. No pneumothorax or pulmonary edema.  IMPRESSION: 1.  Stable lines and tubes. 2. Progressed bilateral pleural effusions and lower lobe collapse or consolidation.   Electronically Signed   By: Genevie Ann M.D.   On: 03/31/2015 07:49   Dg Chest Port 1 View  03/30/2015   CLINICAL DATA:  Extubated and than the reading intubated.  EXAM: PORTABLE CHEST - 1 VIEW  COMPARISON:  Earlier today.  FINDINGS: Endotracheal tube in satisfactory position. Nasogastric tube extending into the stomach. Increased left basilar airspace opacity. Interval mild right mid lung zone airspace opacity with no significant change in right lower lung zone airspace opacity. Small left pleural effusion. Stable left jugular catheter. Mild scoliosis.  IMPRESSION: 1. Increased left basilar atelectasis or pneumonia. 2. Small left pleural effusion. 3. Interval mild right mid lung zone probable pneumonia. 4. No significant change in right basilar atelectasis and possible pneumonia.   Electronically Signed   By: Claudie Revering M.D.   On: 03/30/2015 16:14   Dg Chest Port 1 View  03/30/2015   CLINICAL DATA:  Cardiac arrest.  EXAM: PORTABLE CHEST - 1 VIEW  COMPARISON:  03/29/2015 and prior exams  FINDINGS: Endotracheal tube tip 7 cm  above the carina, left IJ central venous catheter with tip overlying the lower SVC, NG tube entering the stomach with tip off the field of view and right IJ central venous catheter sheath noted.  Right basilar atelectasis/airspace disease has slightly increased.  Mild left basilar atelectasis has improved.  There is no evidence of pneumothorax.  IMPRESSION: Slightly increased right basilar atelectasis/ airspace disease and slightly improved left basilar atelectasis.  Interval placement of NG tube entering the stomach.  No other significant change.   Electronically Signed   By: Margarette Canada M.D.   On: 03/30/2015 07:19   Dg Chest Port 1 View  03/29/2015   CLINICAL DATA:  Cardiac arrest  EXAM: PORTABLE CHEST - 1 VIEW  COMPARISON:  03/29/2015 at 7:  17  FINDINGS: Endotracheal tube and LEFT central venous line unchanged. Pad artifact overlies central chest. Normal cardiac silhouette. There is LEFT basilar atelectasis. No pneumothorax.  IMPRESSION: Mild increased LEFT basilar atelectasis.  Endotracheal tube unchanged.  No pneumothorax.   Electronically Signed   By: Suzy Bouchard M.D.   On: 03/29/2015 08:41   Dg Chest Port 1 View  03/29/2015   CLINICAL DATA:  intubation central line placement.  EXAM: PORTABLE CHEST - 1 VIEW  COMPARISON:  None.  FINDINGS: Endotracheal tube 7 cm from carina. Normal cardiac silhouette. No effusion, infiltrate, pneumothorax. RIGHT IJ sheath.  IMPRESSION: Endotracheal tube 7 cm from carina.   Electronically Signed   By: Suzy Bouchard M.D.   On: 03/29/2015 07:46   Dg Chest Port 1 View  03/29/2015   CLINICAL DATA:  74 year old male status post central line placement.  EXAM: PORTABLE CHEST - 1 VIEW  COMPARISON:  Chest x-ray a 03/29/2015.  FINDINGS:  New left internal jugular central venous catheter with tip terminating at the superior cavoatrial junction. Right IJ central venous Cordis with tip in the internal jugular vein. An endotracheal tube is in place with tip 7.1 cm above the  carina. Lung volumes are low. Bibasilar opacities (left greater than right), most compatible subsegmental atelectasis. No consolidative airspace disease. No pneumothorax. No pleural effusions. No evidence of pulmonary edema. Heart size and mediastinal contours are within normal limits.  IMPRESSION: 1. Support apparatus, as above. New left internal jugular central venous catheter tip terminates at the superior cavoatrial junction. 2. No pneumothorax or other complicating features. 3. Low lung volumes with bibasilar subsegmental atelectasis.   Electronically Signed   By: Vinnie Langton M.D.   On: 03/29/2015 07:33   Dg Abd Portable 1v  04/04/2015   CLINICAL DATA:  Abdominal distention  EXAM: PORTABLE ABDOMEN - 1 VIEW  COMPARISON:  Study obtained earlier in the day  FINDINGS: Nasogastric tube tip and side port are in the stomach. There is moderate generalized bowel dilatation. No air-fluid levels are appreciated. There are multiple surgical clips present. No free air is appreciable on this supine examination.  IMPRESSION: Generalized bowel dilatation in a pattern most suggestive of ileus. No free air appreciable on this supine examination. Nasogastric tube tip and side port in stomach.   Electronically Signed   By: Lowella Grip III M.D.   On: 04/04/2015 09:10   Dg Abd Portable 1v  04/04/2015   CLINICAL DATA:  74 year old male status post enteric tube placement. Confirm positioning.  EXAM: PORTABLE ABDOMEN - 1 VIEW  COMPARISON:  Radiograph dated 04/03/2015  FINDINGS: An enteric tube is partially visualized with tip over the gastric bubble. There is better distention of the stomach. Multiple dilated air-filled loops of small bowel noted. Right upper quadrant cholecystectomy clips. Surgical clips noted over the midline abdomen.  IMPRESSION: Enteric tube with tip over the gastric bubble.   Electronically Signed   By: Anner Crete M.D.   On: 04/04/2015 01:32   Dg Abd Portable 1v  04/03/2015   CLINICAL DATA:   74 year old male status post enteric tube placement. Initial encounter. Recent exploratory laparotomy with total colectomy for lower GI bleeding.  EXAM: PORTABLE ABDOMEN - 1 VIEW  COMPARISON:  04/02/2015.  FINDINGS: Portable AP supine view at 1504 hours. Enteric tube courses to the left upper quadrant. The tip projects over the the proximal gastric air. The side hole is not identified.  Mild motion artifact. Midline abdominal skin staples and right upper quadrant surgical clips again noted. Stable gas pattern with multiple gas-filled small bowel loops in the mid abdomen. Increased density at the left lung base. See chest radiograph from today reported separately.  IMPRESSION: 1. Enteric tube tip terminates in the stomach. 2. Multiple gas-filled small bowel loops in the upper abdomen may indicate ileus.   Electronically Signed   By: Genevie Ann M.D.   On: 04/03/2015 15:13   Dg Abd Portable 1v  04/02/2015   CLINICAL DATA:  NG tube placement  EXAM: PORTABLE ABDOMEN - 1 VIEW  COMPARISON:  None.  FINDINGS: NG tube projects over the stomach. Numerous mildly distended loops of small bowel. Gas is seen into the rectum.  IMPRESSION: NG tube over the stomach.   Electronically Signed   By: Skipper Cliche M.D.   On: 04/02/2015 23:48     CBC  Recent Labs Lab 04/01/15 0742 04/02/15 0455 04/03/15 0317 04/03/15 0802 04/04/15 0430  WBC 7.2 8.4 7.2 8.5  12.3*  HGB 7.6* 7.0* 6.7* 8.2* 10.2*  HCT 23.3* 21.9* 20.5* 25.1* 30.8*  PLT 147* 160 212 222 344  MCV 90.3 90.5 90.2 90.9 90.3  MCH 29.5 28.9 29.5 29.7 29.9  MCHC 32.6 32.0 32.7 32.7 33.1  RDW 16.0* 15.8* 15.1 15.1 14.9  LYMPHSABS  --  1.1 1.0  --  0.9  MONOABS  --  1.1* 0.9  --  1.3*  EOSABS  --  0.3 0.3  --  0.1  BASOSABS  --  0.0 0.0  --  0.0    Chemistries   Recent Labs Lab 03/30/15 0457 03/31/15 0443 04/01/15 0415 04/02/15 0455 04/03/15 0317 04/04/15 0430  NA 144 142 140 141 141 144  K 3.6 3.7 3.7 3.4* 3.6 3.7  CL 119* 117* 114* 110 111 105   CO2 20* 22 23 21* 25 26  GLUCOSE 144* 131* 134* 139* 140* 123*  BUN 12 10 12 14 11 6   CREATININE 1.25* 1.10 1.22 1.29* 0.97 1.07  CALCIUM 7.6* 7.4* 7.4* 7.6* 8.0* 7.8*  MG 1.3* 1.8 2.1  --   --   --   AST  --   --   --  34  --   --   ALT  --   --   --  24  --   --   ALKPHOS  --   --   --  35*  --   --   BILITOT  --   --   --  0.4  --   --    ------------------------------------------------------------------------------------------------------------------ estimated creatinine clearance is 75.5 mL/min (by C-G formula based on Cr of 1.07). ------------------------------------------------------------------------------------------------------------------ No results for input(s): HGBA1C in the last 72 hours. ------------------------------------------------------------------------------------------------------------------ No results for input(s): CHOL, HDL, LDLCALC, TRIG, CHOLHDL, LDLDIRECT in the last 72 hours. ------------------------------------------------------------------------------------------------------------------ No results for input(s): TSH, T4TOTAL, T3FREE, THYROIDAB in the last 72 hours.  Invalid input(s): FREET3 ------------------------------------------------------------------------------------------------------------------ No results for input(s): VITAMINB12, FOLATE, FERRITIN, TIBC, IRON, RETICCTPCT in the last 72 hours.  Coagulation profile  Recent Labs Lab 03/29/15 0756 03/29/15 1007 04/02/15 0455  INR 1.43 1.54* 1.26    No results for input(s): DDIMER in the last 72 hours.  Cardiac Enzymes No results for input(s): CKMB, TROPONINI, MYOGLOBIN in the last 168 hours.  Invalid input(s): CK ------------------------------------------------------------------------------------------------------------------ Invalid input(s): POCBNP   CBG:  Recent Labs Lab 04/03/15 2007 04/03/15 2359 04/04/15 0117 04/04/15 0504 04/04/15 0822  GLUCAP 85 62* 168* 112* 122*        Studies: Dg Chest Port 1 View  04/04/2015   CLINICAL DATA:  Rhonchi with concern for aspiration  EXAM: PORTABLE CHEST - 1 VIEW  COMPARISON:  April 03, 2015  FINDINGS: Central catheter tip is in the superior vena cava. Nasogastric tube tip and side port below the diaphragm. No pneumothorax. There is consolidation in the medial left base. There is patchy infiltrate in the right lower lobe, essentially stable allowing for some difference in the degree of inspiration. Lungs elsewhere clear. Heart size and pulmonary vascularity are normal. No adenopathy.  IMPRESSION: Consolidation medial left base. Patchy infiltrate right lower lobe. Aspiration is a differential consideration given these findings. Lungs elsewhere clear. Tube and catheter positions as described without pneumothorax. No change in cardiac silhouette.   Electronically Signed   By: Lowella Grip III M.D.   On: 04/04/2015 09:08   Dg Chest Port 1 View  04/03/2015   CLINICAL DATA:  Respiratory acidosis.  EXAM: PORTABLE CHEST - 1 VIEW  COMPARISON:  04/02/2015.  FINDINGS: Interim extubation. NG tube and left IJ line in stable position. Stable cardiomegaly. Persistent but improving right lower lobe infiltrate. No pneumothorax. No acute bony abnormality .  IMPRESSION: 1. Interim extubation.  NG tube and left IJ line in stable position. 2. Persistent but improving right lower lobe infiltrate. 3. Stable cardiomegaly.   Electronically Signed   By: Marcello Moores  Register   On: 04/03/2015 07:23   Dg Abd Portable 1v  04/04/2015   CLINICAL DATA:  Abdominal distention  EXAM: PORTABLE ABDOMEN - 1 VIEW  COMPARISON:  Study obtained earlier in the day  FINDINGS: Nasogastric tube tip and side port are in the stomach. There is moderate generalized bowel dilatation. No air-fluid levels are appreciated. There are multiple surgical clips present. No free air is appreciable on this supine examination.  IMPRESSION: Generalized bowel dilatation in a pattern most  suggestive of ileus. No free air appreciable on this supine examination. Nasogastric tube tip and side port in stomach.   Electronically Signed   By: Lowella Grip III M.D.   On: 04/04/2015 09:10   Dg Abd Portable 1v  04/04/2015   CLINICAL DATA:  74 year old male status post enteric tube placement. Confirm positioning.  EXAM: PORTABLE ABDOMEN - 1 VIEW  COMPARISON:  Radiograph dated 04/03/2015  FINDINGS: An enteric tube is partially visualized with tip over the gastric bubble. There is better distention of the stomach. Multiple dilated air-filled loops of small bowel noted. Right upper quadrant cholecystectomy clips. Surgical clips noted over the midline abdomen.  IMPRESSION: Enteric tube with tip over the gastric bubble.   Electronically Signed   By: Anner Crete M.D.   On: 04/04/2015 01:32   Dg Abd Portable 1v  04/03/2015   CLINICAL DATA:  74 year old male status post enteric tube placement. Initial encounter. Recent exploratory laparotomy with total colectomy for lower GI bleeding.  EXAM: PORTABLE ABDOMEN - 1 VIEW  COMPARISON:  04/02/2015.  FINDINGS: Portable AP supine view at 1504 hours. Enteric tube courses to the left upper quadrant. The tip projects over the the proximal gastric air. The side hole is not identified.  Mild motion artifact. Midline abdominal skin staples and right upper quadrant surgical clips again noted. Stable gas pattern with multiple gas-filled small bowel loops in the mid abdomen. Increased density at the left lung base. See chest radiograph from today reported separately.  IMPRESSION: 1. Enteric tube tip terminates in the stomach. 2. Multiple gas-filled small bowel loops in the upper abdomen may indicate ileus.   Electronically Signed   By: Genevie Ann M.D.   On: 04/03/2015 15:13   Dg Abd Portable 1v  04/02/2015   CLINICAL DATA:  NG tube placement  EXAM: PORTABLE ABDOMEN - 1 VIEW  COMPARISON:  None.  FINDINGS: NG tube projects over the stomach. Numerous mildly distended loops of  small bowel. Gas is seen into the rectum.  IMPRESSION: NG tube over the stomach.   Electronically Signed   By: Skipper Cliche M.D.   On: 04/02/2015 23:48      Lab Results  Component Value Date   HGBA1C 6.1* 03/27/2015   Lab Results  Component Value Date   CREATININE 1.07 04/04/2015       Scheduled Meds: . sodium chloride   Intravenous Once  . antiseptic oral rinse  7 mL Mouth Rinse q12n4p  . antiseptic oral rinse  7 mL Mouth Rinse QID  . chlorhexidine  15 mL Mouth Rinse BID  . enoxaparin (LOVENOX) injection  40 mg  Subcutaneous Q24H  . feeding supplement (VITAL HIGH PROTEIN)  1,000 mL Per Tube Q24H  . furosemide  20 mg Intravenous Q12H  . Influenza vac split quadrivalent PF  0.5 mL Intramuscular Tomorrow-1000  . insulin aspart  0-9 Units Subcutaneous 6 times per day  . pantoprazole (PROTONIX) IV  40 mg Intravenous Q24H  . piperacillin-tazobactam (ZOSYN)  IV  3.375 g Intravenous Q8H  . sodium chloride  3 mL Intravenous Q12H  . THROMBI-PAD  1 each Topical Once  . vancomycin  1,250 mg Intravenous Q12H   Continuous Infusions: . sodium chloride 10 mL/hr at 04/04/15 0927  . dextrose 5% lactated ringers Stopped (03/31/15 0857)    Principal Problem:   Acute GI bleeding Active Problems:   HTN (hypertension)   Diabetes mellitus type 2, controlled   Dementia   Hiatal hernia   History of diverticulitis   History of prostate cancer   Acute blood loss anemia   Acute renal failure   GI bleeding   Hematochezia   Acute respiratory failure with hypoxia   History of diverticulosis   Aspiration pneumonia    Time spent: 45 minutes   [facility] Pager [phone]. If 7PM-7AM, please contact night-coverage at [email or web address], password [facility] 04/04/2015, 11:56 AM  LOS: 8 days

## 2015-04-04 NOTE — Progress Notes (Signed)
SLP Cancellation Note  Patient Details Name: Jesse [name] MRN: [id number] DOB: 03-26-1941   Cancelled treatment:       Reason Eval/Treat Not Completed: Other (comment). Order cancelled by MD. They will reorder if needed per RN.    Kimyetta Flott, Katherene Ponto 04/04/2015, 11:23 AM

## 2015-04-04 NOTE — Progress Notes (Signed)
ANTIBIOTIC CONSULT NOTE - INITIAL  Pharmacy Consult for Vancomycin and Zosyn Indication: pneumonia  No Known Allergies  Patient Measurements: Height: 5\' 11"  (180.3 cm) Weight: 222 lb 0.1 oz (100.7 kg) IBW/kg (Calculated) : 75.3  Vital Signs: Temp: 98.8 F (37.1 C) (09/09 0902) Temp Source: Axillary (09/09 0902) BP: 177/94 mmHg (09/09 0902) Pulse Rate: 104 (09/09 0505) Intake/Output from previous day: 09/08 0701 - 09/09 0700 In: 527.8 [I.V.:227.8; NG/GT:300] Out: 5245 [ZOXWR:6045; Stool:600] Intake/Output from this shift: Total I/O In: -  Out: 325 [Urine:325]  Labs:  Recent Labs  04/02/15 0455 04/03/15 0317 04/03/15 0802 04/04/15 0430  WBC 8.4 7.2 8.5 12.3*  HGB 7.0* 6.7* 8.2* 10.2*  PLT 160 212 222 344  CREATININE 1.29* 0.97  --  1.07   Estimated Creatinine Clearance: 75.5 mL/min (by C-G formula based on Cr of 1.07). No results for input(s): VANCOTROUGH, VANCOPEAK, VANCORANDOM, GENTTROUGH, GENTPEAK, GENTRANDOM, TOBRATROUGH, TOBRAPEAK, TOBRARND, AMIKACINPEAK, AMIKACINTROU, AMIKACIN in the last 72 hours.   Microbiology: Recent Results (from the past 720 hour(s))  MRSA PCR Screening     Status: None   Collection Time: 03/27/15  7:21 PM  Result Value Ref Range Status   MRSA by PCR NEGATIVE NEGATIVE Final    Comment:        The GeneXpert MRSA Assay (FDA approved for NASAL specimens only), is one component of a comprehensive MRSA colonization surveillance program. It is not intended to diagnose MRSA infection nor to guide or monitor treatment for MRSA infections.   Culture, respiratory (NON-Expectorated)     Status: None   Collection Time: 03/30/15  4:08 PM  Result Value Ref Range Status   Specimen Description TRACHEAL ASPIRATE  Final   Special Requests Normal  Final   Gram Stain   Final    ABUNDANT WBC PRESENT,BOTH PMN AND MONONUCLEAR NO SQUAMOUS EPITHELIAL CELLS SEEN MODERATE GRAM POSITIVE COCCI IN PAIRS RARE GRAM NEGATIVE RODS RARE GRAM POSITIVE  COCCI    Culture   Final    MODERATE STREPTOCOCCUS PNEUMONIAE Performed at [facility]    Report Status 04/03/2015 FINAL  Final   Organism ID, Bacteria STREPTOCOCCUS PNEUMONIAE  Final      Susceptibility   Streptococcus pneumoniae - MIC (ETEST)*    CEFTRIAXONE 0.25 SENSITIVE Sensitive     LEVOFLOXACIN 0.75 SENSITIVE Sensitive     PENICILLIN 1.0 INTERMEDIATE Intermediate     * MODERATE STREPTOCOCCUS PNEUMONIAE  Culture, Urine     Status: None   Collection Time: 03/31/15 10:45 AM  Result Value Ref Range Status   Specimen Description URINE, CATHETERIZED  Final   Special Requests NONE  Final   Culture NO GROWTH 1 DAY  Final   Report Status 04/01/2015 FINAL  Final  Culture, blood (routine x 2)     Status: None (Preliminary result)   Collection Time: 03/31/15 10:55 AM  Result Value Ref Range Status   Specimen Description BLOOD LEFT THUMB  Final   Special Requests IN PEDIATRIC BOTTLE 1CC  Final   Culture NO GROWTH 3 DAYS  Final   Report Status PENDING  Incomplete  Culture, respiratory (NON-Expectorated)     Status: None   Collection Time: 04/01/15  8:56 AM  Result Value Ref Range Status   Specimen Description TRACHEAL ASPIRATE  Final   Special Requests NONE  Final   Gram Stain   Final    ABUNDANT WBC PRESENT,BOTH PMN AND MONONUCLEAR NO SQUAMOUS EPITHELIAL CELLS SEEN ABUNDANT GRAM POSITIVE COCCI IN PAIRS Performed at [facility]  Culture   Final    NO GROWTH 2 DAYS Performed at [facility]    Report Status 04/03/2015 FINAL  Final    Medical History: Past Medical History  Diagnosis Date  . Memory loss   . Hypertension   . Diabetes mellitus without complication   . Diverticulosis     Of colon  . Hepatitis C     Virus has never been treated. No history of cirrhosis.  . Prostate cancer ~ 1990s.    Treated with radiation seed implant.  . Hiatal hernia     Does not require meds for reflux symptoms.    Medications:  Scheduled:  . sodium  chloride   Intravenous Once  . antiseptic oral rinse  7 mL Mouth Rinse q12n4p  . antiseptic oral rinse  7 mL Mouth Rinse QID  . chlorhexidine  15 mL Mouth Rinse BID  . enoxaparin (LOVENOX) injection  40 mg Subcutaneous Q24H  . feeding supplement (VITAL HIGH PROTEIN)  1,000 mL Per Tube Q24H  . furosemide  20 mg Intravenous Q12H  . Influenza vac split quadrivalent PF  0.5 mL Intramuscular Tomorrow-1000  . insulin aspart  0-9 Units Subcutaneous 6 times per day  . pantoprazole (PROTONIX) IV  40 mg Intravenous Q24H  . sodium chloride  3 mL Intravenous Q12H  . THROMBI-PAD  1 each Topical Once   Infusions:  . sodium chloride 10 mL/hr at 04/04/15 0927  . dextrose 5% lactated ringers Stopped (03/31/15 0857)   Assessment: 74 yo M admitted 9/1 with severe GIB requiring emergent ex-lap and total colectomy.  Pt was previously on Rocephin for strep pneumo PNA (based on 9/4 cx data).  Today pt is tachycardic with increase in WBC.  High residuals on TF.  Suspect aspiration PNA vs HCAP.  To change abx to Vancomycin and Zosyn.  Goal of Therapy:  Vancomycin trough level 15-20 mcg/ml  Plan:  Vancomycin 1250mg  IV q12h Zosyn 3.375 gm IV q8h (4 hour infusion). Measure antibiotic drug levels at steady state Follow up culture results  [facility], Pharm.D., BCPS Clinical Pharmacist Pager [phone] 04/04/2015 10:17 AM

## 2015-04-05 ENCOUNTER — Inpatient Hospital Stay (HOSPITAL_COMMUNITY): Payer: Medicare (Managed Care)

## 2015-04-05 LAB — COMPREHENSIVE METABOLIC PANEL
ALBUMIN: 2 g/dL — AB (ref 3.5–5.0)
ALK PHOS: 58 U/L (ref 38–126)
ALT: 46 U/L (ref 17–63)
ANION GAP: 10 (ref 5–15)
AST: 39 U/L (ref 15–41)
BUN: 7 mg/dL (ref 6–20)
CALCIUM: 8.5 mg/dL — AB (ref 8.9–10.3)
CO2: 31 mmol/L (ref 22–32)
Chloride: 104 mmol/L (ref 101–111)
Creatinine, Ser: 1.2 mg/dL (ref 0.61–1.24)
GFR calc Af Amer: >60 mL/min (ref >60)
GFR calc non Af Amer: 59 mL/min — ABNORMAL LOW (ref >60)
GLUCOSE: 121 mg/dL — AB (ref 65–99)
Potassium: 3 mmol/L — ABNORMAL LOW (ref 3.5–5.1)
SODIUM: 145 mmol/L (ref 135–145)
Total Bilirubin: 1 mg/dL (ref 0.3–1.2)
Total Protein: 5.5 g/dL — ABNORMAL LOW (ref 6.5–8.1)

## 2015-04-05 LAB — GLUCOSE, CAPILLARY
GLUCOSE-CAPILLARY: 121 mg/dL — AB (ref 65–99)
GLUCOSE-CAPILLARY: 112 mg/dL — AB (ref 65–99)
GLUCOSE-CAPILLARY: 124 mg/dL — AB (ref 65–99)
Glucose-Capillary: 126 mg/dL — ABNORMAL HIGH (ref 65–99)
Glucose-Capillary: 115 mg/dL — ABNORMAL HIGH (ref 65–99)
Glucose-Capillary: 111 mg/dL — ABNORMAL HIGH (ref 65–99)

## 2015-04-05 LAB — CBC
HCT: 31.5 % — ABNORMAL LOW (ref 39.0–52.0)
HEMOGLOBIN: 10.2 g/dL — AB (ref 13.0–17.0)
MCH: 29.9 pg (ref 26.0–34.0)
MCHC: 32.4 g/dL (ref 30.0–36.0)
MCV: 92.4 fL (ref 78.0–100.0)
Platelets: 407 10*3/uL — ABNORMAL HIGH (ref 150–400)
RBC: 3.41 MIL/uL — AB (ref 4.22–5.81)
RDW: 15.4 % (ref 11.5–15.5)
WBC: 12.1 10*3/uL — ABNORMAL HIGH (ref 4.0–10.5)

## 2015-04-05 LAB — CULTURE, BLOOD (ROUTINE X 2): Culture: NO GROWTH

## 2015-04-05 LAB — MAGNESIUM: MAGNESIUM: 2.1 mg/dL (ref 1.7–2.4)

## 2015-04-05 MED ORDER — POTASSIUM CHLORIDE 10 MEQ/100ML IV SOLN
10.0000 meq | INTRAVENOUS | Status: AC
  Administered 2015-04-05 (×6): 10 meq via INTRAVENOUS
  Filled 2015-04-05 (×6): qty 100

## 2015-04-05 NOTE — Progress Notes (Signed)
Speech Language Pathology   Patient Details Name: Jesse [name] MRN: [id number] DOB: 1941-01-29 Today's Date: 04/05/2015 Time: 0962-8366 SLP Time Calculation (min) (ACUTE ONLY): 20 min  Assessment / Plan / Recommendation Clinical Impression  Please see Imaging for complete results of MBS.  Safest diet is NPO.  ST to follow for trial swallowing therapy which will be dependent upon the patient's ability to follow commands.              Recommendations Medication Administration: Via alternative means              Oral Care Recommendations: Oral care QID    GO     Lamar Sprinkles 04/05/2015, 4:24 PM  Shelly Flatten, [place], [facility] SLP [phone]

## 2015-04-05 NOTE — Progress Notes (Signed)
7 Days Post-Op  Subjective: Alert, no n/v, wants to drink something  Objective: Vital signs in last 24 hours: Temp:  [97.4 F (36.3 C)-99.1 F (37.3 C)] 98.2 F (36.8 C) (09/10 0747) Pulse Rate:  [93] 93 (09/10 0608) Resp:  [23] 23 (09/10 0608) BP: (147-157)/(68-99) 151/99 mmHg (09/10 0747) SpO2:  [95 %] 95 % (09/10 0608) Weight:  [98.4 kg (216 lb 14.9 oz)] 98.4 kg (216 lb 14.9 oz) (09/10 0400) Last BM Date: 04/05/15  Intake/Output from previous day: 09/09 0701 - 09/10 0700 In: 460 [I.V.:110; IV Piggyback:350] Out: 2675 [Urine:2600; Stool:75] Intake/Output this shift: Total I/O In: -  Out: 175 [Urine:150; Stool:25]  GI: wound closed no drainage no infection stoma functional approp tender  Lab Results:   Recent Labs  04/03/15 0802 04/04/15 0430  WBC 8.5 12.3*  HGB 8.2* 10.2*  HCT 25.1* 30.8*  PLT 222 344   BMET  Recent Labs  04/04/15 0430 04/05/15 0510  NA 144 145  K 3.7 3.0*  CL 105 104  CO2 26 31  GLUCOSE 123* 121*  BUN 6 7  CREATININE 1.07 1.20  CALCIUM 7.8* 8.5*   PT/INR No results for input(s): LABPROT, INR in the last 72 hours. ABG No results for input(s): PHART, HCO3 in the last 72 hours.  Invalid input(s): PCO2, PO2  Studies/Results: Dg Chest Port 1 View  04/04/2015   CLINICAL DATA:  Rhonchi with concern for aspiration  EXAM: PORTABLE CHEST - 1 VIEW  COMPARISON:  April 03, 2015  FINDINGS: Central catheter tip is in the superior vena cava. Nasogastric tube tip and side port below the diaphragm. No pneumothorax. There is consolidation in the medial left base. There is patchy infiltrate in the right lower lobe, essentially stable allowing for some difference in the degree of inspiration. Lungs elsewhere clear. Heart size and pulmonary vascularity are normal. No adenopathy.  IMPRESSION: Consolidation medial left base. Patchy infiltrate right lower lobe. Aspiration is a differential consideration given these findings. Lungs elsewhere clear. Tube and  catheter positions as described without pneumothorax. No change in cardiac silhouette.   Electronically Signed   By: Lowella Grip III M.D.   On: 04/04/2015 09:08   Dg Abd Portable 1v  04/04/2015   CLINICAL DATA:  Abdominal distention  EXAM: PORTABLE ABDOMEN - 1 VIEW  COMPARISON:  Study obtained earlier in the day  FINDINGS: Nasogastric tube tip and side port are in the stomach. There is moderate generalized bowel dilatation. No air-fluid levels are appreciated. There are multiple surgical clips present. No free air is appreciable on this supine examination.  IMPRESSION: Generalized bowel dilatation in a pattern most suggestive of ileus. No free air appreciable on this supine examination. Nasogastric tube tip and side port in stomach.   Electronically Signed   By: Lowella Grip III M.D.   On: 04/04/2015 09:10   Dg Abd Portable 1v  04/04/2015   CLINICAL DATA:  74 year old male status post enteric tube placement. Confirm positioning.  EXAM: PORTABLE ABDOMEN - 1 VIEW  COMPARISON:  Radiograph dated 04/03/2015  FINDINGS: An enteric tube is partially visualized with tip over the gastric bubble. There is better distention of the stomach. Multiple dilated air-filled loops of small bowel noted. Right upper quadrant cholecystectomy clips. Surgical clips noted over the midline abdomen.  IMPRESSION: Enteric tube with tip over the gastric bubble.   Electronically Signed   By: Anner Crete M.D.   On: 04/04/2015 01:32   Dg Abd Portable 1v  04/03/2015  CLINICAL DATA:  74 year old male status post enteric tube placement. Initial encounter. Recent exploratory laparotomy with total colectomy for lower GI bleeding.  EXAM: PORTABLE ABDOMEN - 1 VIEW  COMPARISON:  04/02/2015.  FINDINGS: Portable AP supine view at 1504 hours. Enteric tube courses to the left upper quadrant. The tip projects over the the proximal gastric air. The side hole is not identified.  Mild motion artifact. Midline abdominal skin staples and right  upper quadrant surgical clips again noted. Stable gas pattern with multiple gas-filled small bowel loops in the mid abdomen. Increased density at the left lung base. See chest radiograph from today reported separately.  IMPRESSION: 1. Enteric tube tip terminates in the stomach. 2. Multiple gas-filled small bowel loops in the upper abdomen may indicate ileus.   Electronically Signed   By: Genevie Ann M.D.   On: 04/03/2015 15:13    Anti-infectives: Anti-infectives    Start     Dose/Rate Route Frequency Ordered Stop   04/04/15 1200  piperacillin-tazobactam (ZOSYN) IVPB 3.375 g     3.375 g 12.5 mL/hr over 240 Minutes Intravenous Every 8 hours 04/04/15 1019     04/04/15 1100  vancomycin (VANCOCIN) 1,250 mg in sodium chloride 0.9 % 250 mL IVPB     1,250 mg 166.7 mL/hr over 90 Minutes Intravenous Every 12 hours 04/04/15 1019     04/02/15 1200  cefTRIAXone (ROCEPHIN) 2 g in dextrose 5 % 50 mL IVPB  Status:  Discontinued     2 g 100 mL/hr over 30 Minutes Intravenous Every 24 hours 04/02/15 0955 04/04/15 0922   03/31/15 2200  vancomycin (VANCOCIN) IVPB 750 mg/150 ml premix  Status:  Discontinued     750 mg 150 mL/hr over 60 Minutes Intravenous Every 12 hours 03/31/15 0929 04/01/15 0839   03/31/15 1000  vancomycin (VANCOCIN) 2,000 mg in sodium chloride 0.9 % 500 mL IVPB     2,000 mg 250 mL/hr over 120 Minutes Intravenous  Once 03/31/15 0927 03/31/15 1237   03/31/15 1000  piperacillin-tazobactam (ZOSYN) IVPB 3.375 g  Status:  Discontinued     3.375 g 12.5 mL/hr over 240 Minutes Intravenous Every 8 hours 03/31/15 0927 04/02/15 0955   03/29/15 1000  cefoTEtan (CEFOTAN) 2 g in dextrose 5 % 50 mL IVPB     2 g 100 mL/hr over 30 Minutes Intravenous To Surgery 03/29/15 0957 03/29/15 1012      Assessment/Plan: POD#7 exploratory laparotomy with total colectomy for bleeding  Cont dressing changes Oob/pulm toilet Await speech, if passes swallow can advance diet scd lovenox Recheck cbc tomorrow as wbc  previously elevated, looks well though and afebrile   [facility] 04/05/2015

## 2015-04-05 NOTE — Progress Notes (Signed)
[facility] PROGRESS NOTE  Geraldine Tesar QRF:[id number] DOB: 03/18/1941 DOA: 03/27/2015 PCP: No primary care provider on file.  Assessment/Plan: Principal Problem:   Acute GI bleeding Active Problems:   HTN (hypertension)   Diabetes mellitus type 2, controlled   Dementia   Hiatal hernia   History of diverticulitis   History of prostate cancer   Acute blood loss anemia   Acute renal failure   GI bleeding   Hematochezia   Acute respiratory failure with hypoxia   History of diverticulosis   Aspiration pneumonia   HISTORY OF PRESENT ILLNESS:  Jesse [name] is a 74 y.o. male. 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 HTN. 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 Hiatal hernia on EGD ~ 2011. Diverticulosis on colonoscopy ~ 2011. GI bleed with hematochezia in 2015, wife not sure what the source was; did not undergo repeat endoscopic evaluation then. Being managed for massive GI bleed requiring emergent subtotal colectomy.  SUBJECTIVE: Patient is much more awake and alert today, denies nausea vomiting, does have ostomy output, on room air   Assessment and plan #1 possible aspiration pneumonia, increase in white count from 8.5-12.3. Chest x-ray on 9/9 shows infiltrate in the right lower lobe, high volume of residual tube feeding noted below RN, NG tube discontinued, aspiration certainly possible, patient initiated on vancomycin and Zosyn on 9/9. Patient has been receiving antibiotics since 9/4. Speech therapy evaluation pending, once completed   switch to by mouth Augmentin for another 7 days  #2 acute lower GI bleeding suspected to be diverticula, status post subtotal colectomy, surgery following, currently nothing by mouth pending speech therapy evaluation, probable ileus on KUB, hemoglobin stable,NGT discontinued,  #3 confusion-metabolic encephalopathy/ICU delirium, baseline dementia, avoid Haldol.  #4 hypertension-continue low-dose Lasix, start  prn hydralazine  #5 hyperglycemia-continue sliding scale insulin, hold metformin  #6 Hypokalemia-replete with K runs, magnesium 2.1  #Urinary retention-DC Foley, check postvoid residual  #7 disposition patient will need SNF placement  Code Status:      Code Status Orders        Start     Ordered   03/29/15 1937  Full code   Continuous     03/29/15 1936     Family Communication: family updated about patient's clinical progress Disposition Plan:  As above , social work consult    Consultants:  General surgery  Critical care   Antibiotics: Anti-infectives    Start     Dose/Rate Route Frequency Ordered Stop   04/04/15 1200  piperacillin-tazobactam (ZOSYN) IVPB 3.375 g     3.375 g 12.5 mL/hr over 240 Minutes Intravenous Every 8 hours 04/04/15 1019     04/04/15 1100  vancomycin (VANCOCIN) 1,250 mg in sodium chloride 0.9 % 250 mL IVPB     1,250 mg 166.7 mL/hr over 90 Minutes Intravenous Every 12 hours 04/04/15 1019     04/02/15 1200  cefTRIAXone (ROCEPHIN) 2 g in dextrose 5 % 50 mL IVPB  Status:  Discontinued     2 g 100 mL/hr over 30 Minutes Intravenous Every 24 hours 04/02/15 0955 04/04/15 0922   03/31/15 2200  vancomycin (VANCOCIN) IVPB 750 mg/150 ml premix  Status:  Discontinued     750 mg 150 mL/hr over 60 Minutes Intravenous Every 12 hours 03/31/15 0929 04/01/15 0839   03/31/15 1000  vancomycin (VANCOCIN) 2,000 mg in sodium chloride 0.9 % 500 mL IVPB     2,000 mg 250 mL/hr over 120  Minutes Intravenous  Once 03/31/15 0927 03/31/15 1237   03/31/15 1000  piperacillin-tazobactam (ZOSYN) IVPB 3.375 g  Status:  Discontinued     3.375 g 12.5 mL/hr over 240 Minutes Intravenous Every 8 hours 03/31/15 0927 04/02/15 0955   03/29/15 1000  cefoTEtan (CEFOTAN) 2 g in dextrose 5 % 50 mL IVPB     2 g 100 mL/hr over 30 Minutes Intravenous To Surgery 03/29/15 0957 03/29/15 1012        Objective: Filed Vitals:   04/05/15 0000 04/05/15 0400 04/05/15 0608 04/05/15 0747  BP:  147/68 155/79 157/87 151/99  Pulse:   93   Temp: 98.2 F (36.8 C) 97.4 F (36.3 C)  98.2 F (36.8 C)  TempSrc: Axillary Oral  Oral  Resp:   23   Height:      Weight:  98.4 kg (216 lb 14.9 oz)    SpO2:   95%     Intake/Output Summary (Last 24 hours) at 04/05/15 1015 Last data filed at 04/05/15 0747  Gross per 24 hour  Intake    460 ml  Output   2525 ml  Net  -2065 ml    Exam:  General: folows commands Neuro: Followed commands, speaking about war HEENT: Line clean, jvd wnl Cardiovascular: s1 s2 RRR no  Lungs: CTA Abdomen: Soft, tender, non distended, normal bowel sound Skin: mild edema  Data Review   Micro Results Recent Results (from the past 240 hour(s))  MRSA PCR Screening     Status: None   Collection Time: 03/27/15  7:21 PM  Result Value Ref Range Status   MRSA by PCR NEGATIVE NEGATIVE Final    Comment:        The GeneXpert MRSA Assay (FDA approved for NASAL specimens only), is one component of a comprehensive MRSA colonization surveillance program. It is not intended to diagnose MRSA infection nor to guide or monitor treatment for MRSA infections.   Culture, respiratory (NON-Expectorated)     Status: None   Collection Time: 03/30/15  4:08 PM  Result Value Ref Range Status   Specimen Description TRACHEAL ASPIRATE  Final   Special Requests Normal  Final   Gram Stain   Final    ABUNDANT WBC PRESENT,BOTH PMN AND MONONUCLEAR NO SQUAMOUS EPITHELIAL CELLS SEEN MODERATE GRAM POSITIVE COCCI IN PAIRS RARE GRAM NEGATIVE RODS RARE GRAM POSITIVE COCCI    Culture   Final    MODERATE STREPTOCOCCUS PNEUMONIAE Performed at [facility]    Report Status 04/03/2015 FINAL  Final   Organism ID, Bacteria STREPTOCOCCUS PNEUMONIAE  Final      Susceptibility   Streptococcus pneumoniae - MIC (ETEST)*    CEFTRIAXONE 0.25 SENSITIVE Sensitive     LEVOFLOXACIN 0.75 SENSITIVE Sensitive     PENICILLIN 1.0 INTERMEDIATE Intermediate     * MODERATE  STREPTOCOCCUS PNEUMONIAE  Culture, Urine     Status: None   Collection Time: 03/31/15 10:45 AM  Result Value Ref Range Status   Specimen Description URINE, CATHETERIZED  Final   Special Requests NONE  Final   Culture NO GROWTH 1 DAY  Final   Report Status 04/01/2015 FINAL  Final  Culture, blood (routine x 2)     Status: None (Preliminary result)   Collection Time: 03/31/15 10:55 AM  Result Value Ref Range Status   Specimen Description BLOOD LEFT THUMB  Final   Special Requests IN PEDIATRIC BOTTLE 1CC  Final   Culture NO GROWTH 4 DAYS  Final   Report Status PENDING  Incomplete  Culture, respiratory (NON-Expectorated)     Status: None   Collection Time: 04/01/15  8:56 AM  Result Value Ref Range Status   Specimen Description TRACHEAL ASPIRATE  Final   Special Requests NONE  Final   Gram Stain   Final    ABUNDANT WBC PRESENT,BOTH PMN AND MONONUCLEAR NO SQUAMOUS EPITHELIAL CELLS SEEN ABUNDANT GRAM POSITIVE COCCI IN PAIRS Performed at [facility]    Culture   Final    NO GROWTH 2 DAYS Performed at [facility]    Report Status 04/03/2015 FINAL  Final    Radiology Reports Dg Chest Port 1 View  04/04/2015   CLINICAL DATA:  Rhonchi with concern for aspiration  EXAM: PORTABLE CHEST - 1 VIEW  COMPARISON:  April 03, 2015  FINDINGS: Central catheter tip is in the superior vena cava. Nasogastric tube tip and side port below the diaphragm. No pneumothorax. There is consolidation in the medial left base. There is patchy infiltrate in the right lower lobe, essentially stable allowing for some difference in the degree of inspiration. Lungs elsewhere clear. Heart size and pulmonary vascularity are normal. No adenopathy.  IMPRESSION: Consolidation medial left base. Patchy infiltrate right lower lobe. Aspiration is a differential consideration given these findings. Lungs elsewhere clear. Tube and catheter positions as described without pneumothorax. No change in cardiac silhouette.    Electronically Signed   By: Lowella Grip III M.D.   On: 04/04/2015 09:08   Dg Chest Port 1 View  04/03/2015   CLINICAL DATA:  Respiratory acidosis.  EXAM: PORTABLE CHEST - 1 VIEW  COMPARISON:  04/02/2015.  FINDINGS: Interim extubation. NG tube and left IJ line in stable position. Stable cardiomegaly. Persistent but improving right lower lobe infiltrate. No pneumothorax. No acute bony abnormality .  IMPRESSION: 1. Interim extubation.  NG tube and left IJ line in stable position. 2. Persistent but improving right lower lobe infiltrate. 3. Stable cardiomegaly.   Electronically Signed   By: Marcello Moores  Register   On: 04/03/2015 07:23   Dg Chest Port 1 View  04/02/2015   CLINICAL DATA:  Hypoxia  EXAM: PORTABLE CHEST - 1 VIEW  COMPARISON:  April 01, 2015  FINDINGS: Endotracheal tube tip is 4.7 cm above the carina. Central catheter tip is in the superior vena cava just beyond the junction with the right atrium. Nasogastric tube tip is at the gastroesophageal junction with the side port slightly above the gastroesophageal junction. No pneumothorax. There is patchy infiltrate in both lung bases with left lower lobe consolidation. Infiltrate is more patchy on the right. Heart is upper normal in size with pulmonary vascularity within normal limits. No adenopathy.  IMPRESSION: Airspace disease in both lower lobes with consolidation in the left base and more patchy infiltrate in the right base. These changes are stable. No change in cardiac silhouette. Tube and catheter positions are unchanged without pneumothorax. Note that the nasogastric tube side port is above the gastroesophageal junction. It may be prudent to advance nasogastric tube 8-10 cm to insure that both nasogastric tube tip and side port are well within the stomach.   Electronically Signed   By: Lowella Grip III M.D.   On: 04/02/2015 06:59   Dg Chest Port 1 View  04/01/2015   CLINICAL DATA:  Intubation.  EXAM: PORTABLE CHEST - 1 VIEW  COMPARISON:   03/31/2015.  FINDINGS: Endotracheal tube, NG tube, left IJ line in stable position. Stable cardiomegaly. Low lung volumes with bilateral lower lobe atelectasis and/or infiltrates.  No pleural effusion or pneumothorax.  IMPRESSION: 1. Lines and tubes in stable position. 2. Low lung volumes with bibasilar atelectasis and/or infiltrates. 3. Stable cardiomegaly.   Electronically Signed   By: Marcello Moores  Register   On: 04/01/2015 07:17   Dg Chest Port 1 View  03/31/2015   CLINICAL DATA:  74 year old male with acute GI bleeding, intubated.  EXAM: PORTABLE CHEST - 1 VIEW  COMPARISON:  03/30/2015 and earlier.  FINDINGS: Portable AP semi upright view at 0535 hours. Stable endotracheal tube tip at the level the clavicles. Enteric tube courses to the left upper quadrant, tip not included. Stable left IJ central line. Increased veiling opacity right greater than left with increased lower lobe opacity and obscuration of the diaphragm. Stable cardiac size and mediastinal contours. Upper lobes are stable. No pneumothorax or pulmonary edema.  IMPRESSION: 1.  Stable lines and tubes. 2. Progressed bilateral pleural effusions and lower lobe collapse or consolidation.   Electronically Signed   By: Genevie Jesse M.D.   On: 03/31/2015 07:49   Dg Chest Port 1 View  03/30/2015   CLINICAL DATA:  Extubated and than the reading intubated.  EXAM: PORTABLE CHEST - 1 VIEW  COMPARISON:  Earlier today.  FINDINGS: Endotracheal tube in satisfactory position. Nasogastric tube extending into the stomach. Increased left basilar airspace opacity. Interval mild right mid lung zone airspace opacity with no significant change in right lower lung zone airspace opacity. Small left pleural effusion. Stable left jugular catheter. Mild scoliosis.  IMPRESSION: 1. Increased left basilar atelectasis or pneumonia. 2. Small left pleural effusion. 3. Interval mild right mid lung zone probable pneumonia. 4. No significant change in right basilar atelectasis and possible  pneumonia.   Electronically Signed   By: Claudie Revering M.D.   On: 03/30/2015 16:14   Dg Chest Port 1 View  03/30/2015   CLINICAL DATA:  Cardiac arrest.  EXAM: PORTABLE CHEST - 1 VIEW  COMPARISON:  03/29/2015 and prior exams  FINDINGS: Endotracheal tube tip 7 cm above the carina, left IJ central venous catheter with tip overlying the lower SVC, NG tube entering the stomach with tip off the field of view and right IJ central venous catheter sheath noted.  Right basilar atelectasis/airspace disease has slightly increased.  Mild left basilar atelectasis has improved.  There is no evidence of pneumothorax.  IMPRESSION: Slightly increased right basilar atelectasis/ airspace disease and slightly improved left basilar atelectasis.  Interval placement of NG tube entering the stomach.  No other significant change.   Electronically Signed   By: Margarette Canada M.D.   On: 03/30/2015 07:19   Dg Chest Port 1 View  03/29/2015   CLINICAL DATA:  Cardiac arrest  EXAM: PORTABLE CHEST - 1 VIEW  COMPARISON:  03/29/2015 at 7:  17  FINDINGS: Endotracheal tube and LEFT central venous line unchanged. Pad artifact overlies central chest. Normal cardiac silhouette. There is LEFT basilar atelectasis. No pneumothorax.  IMPRESSION: Mild increased LEFT basilar atelectasis.  Endotracheal tube unchanged.  No pneumothorax.   Electronically Signed   By: Suzy Bouchard M.D.   On: 03/29/2015 08:41   Dg Chest Port 1 View  03/29/2015   CLINICAL DATA:  intubation central line placement.  EXAM: PORTABLE CHEST - 1 VIEW  COMPARISON:  None.  FINDINGS: Endotracheal tube 7 cm from carina. Normal cardiac silhouette. No effusion, infiltrate, pneumothorax. RIGHT IJ sheath.  IMPRESSION: Endotracheal tube 7 cm from carina.   Electronically Signed   By: Suzy Bouchard M.D.   On:  03/29/2015 07:46   Dg Chest Port 1 View  03/29/2015   CLINICAL DATA:  73 year old male status post central line placement.  EXAM: PORTABLE CHEST - 1 VIEW  COMPARISON:  Chest x-ray a  03/29/2015.  FINDINGS: New left internal jugular central venous catheter with tip terminating at the superior cavoatrial junction. Right IJ central venous Cordis with tip in the internal jugular vein. An endotracheal tube is in place with tip 7.1 cm above the carina. Lung volumes are low. Bibasilar opacities (left greater than right), most compatible subsegmental atelectasis. No consolidative airspace disease. No pneumothorax. No pleural effusions. No evidence of pulmonary edema. Heart size and mediastinal contours are within normal limits.  IMPRESSION: 1. Support apparatus, as above. New left internal jugular central venous catheter tip terminates at the superior cavoatrial junction. 2. No pneumothorax or other complicating features. 3. Low lung volumes with bibasilar subsegmental atelectasis.   Electronically Signed   By: Vinnie Langton M.D.   On: 03/29/2015 07:33   Dg Abd Portable 1v  04/04/2015   CLINICAL DATA:  Abdominal distention  EXAM: PORTABLE ABDOMEN - 1 VIEW  COMPARISON:  Study obtained earlier in the day  FINDINGS: Nasogastric tube tip and side port are in the stomach. There is moderate generalized bowel dilatation. No air-fluid levels are appreciated. There are multiple surgical clips present. No free air is appreciable on this supine examination.  IMPRESSION: Generalized bowel dilatation in a pattern most suggestive of ileus. No free air appreciable on this supine examination. Nasogastric tube tip and side port in stomach.   Electronically Signed   By: Lowella Grip III M.D.   On: 04/04/2015 09:10   Dg Abd Portable 1v  04/04/2015   CLINICAL DATA:  74 year old male status post enteric tube placement. Confirm positioning.  EXAM: PORTABLE ABDOMEN - 1 VIEW  COMPARISON:  Radiograph dated 04/03/2015  FINDINGS: An enteric tube is partially visualized with tip over the gastric bubble. There is better distention of the stomach. Multiple dilated air-filled loops of small bowel noted. Right upper  quadrant cholecystectomy clips. Surgical clips noted over the midline abdomen.  IMPRESSION: Enteric tube with tip over the gastric bubble.   Electronically Signed   By: Anner Crete M.D.   On: 04/04/2015 01:32   Dg Abd Portable 1v  04/03/2015   CLINICAL DATA:  74 year old male status post enteric tube placement. Initial encounter. Recent exploratory laparotomy with total colectomy for lower GI bleeding.  EXAM: PORTABLE ABDOMEN - 1 VIEW  COMPARISON:  04/02/2015.  FINDINGS: Portable AP supine view at 1504 hours. Enteric tube courses to the left upper quadrant. The tip projects over the the proximal gastric air. The side hole is not identified.  Mild motion artifact. Midline abdominal skin staples and right upper quadrant surgical clips again noted. Stable gas pattern with multiple gas-filled small bowel loops in the mid abdomen. Increased density at the left lung base. See chest radiograph from today reported separately.  IMPRESSION: 1. Enteric tube tip terminates in the stomach. 2. Multiple gas-filled small bowel loops in the upper abdomen may indicate ileus.   Electronically Signed   By: Genevie Jesse M.D.   On: 04/03/2015 15:13   Dg Abd Portable 1v  04/02/2015   CLINICAL DATA:  NG tube placement  EXAM: PORTABLE ABDOMEN - 1 VIEW  COMPARISON:  None.  FINDINGS: NG tube projects over the stomach. Numerous mildly distended loops of small bowel. Gas is seen into the rectum.  IMPRESSION: NG tube over the stomach.  Electronically Signed   By: Skipper Cliche M.D.   On: 04/02/2015 23:48     CBC  Recent Labs Lab 04/02/15 0455 04/03/15 0317 04/03/15 0802 04/04/15 0430 04/05/15 0812  WBC 8.4 7.2 8.5 12.3* 12.1*  HGB 7.0* 6.7* 8.2* 10.2* 10.2*  HCT 21.9* 20.5* 25.1* 30.8* 31.5*  PLT 160 212 222 344 407*  MCV 90.5 90.2 90.9 90.3 92.4  MCH 28.9 29.5 29.7 29.9 29.9  MCHC 32.0 32.7 32.7 33.1 32.4  RDW 15.8* 15.1 15.1 14.9 15.4  LYMPHSABS 1.1 1.0  --  0.9  --   MONOABS 1.1* 0.9  --  1.3*  --   EOSABS 0.3 0.3   --  0.1  --   BASOSABS 0.0 0.0  --  0.0  --     Chemistries   Recent Labs Lab 03/30/15 0457 03/31/15 0443 04/01/15 0415 04/02/15 0455 04/03/15 0317 04/04/15 0430 04/05/15 0510  NA 144 142 140 141 141 144 145  K 3.6 3.7 3.7 3.4* 3.6 3.7 3.0*  CL 119* 117* 114* 110 111 105 104  CO2 20* 22 23 21* 25 26 31   GLUCOSE 144* 131* 134* 139* 140* 123* 121*  BUN 12 10 12 14 11 6 7   CREATININE 1.25* 1.10 1.22 1.29* 0.97 1.07 1.20  CALCIUM 7.6* 7.4* 7.4* 7.6* 8.0* 7.8* 8.5*  MG 1.3* 1.8 2.1  --   --   --   --   AST  --   --   --  34  --   --  39  ALT  --   --   --  24  --   --  46  ALKPHOS  --   --   --  35*  --   --  58  BILITOT  --   --   --  0.4  --   --  1.0   ------------------------------------------------------------------------------------------------------------------ estimated creatinine clearance is 66.5 mL/min (by C-G formula based on Cr of 1.2). ------------------------------------------------------------------------------------------------------------------ No results for input(s): HGBA1C in the last 72 hours. ------------------------------------------------------------------------------------------------------------------ No results for input(s): CHOL, HDL, LDLCALC, TRIG, CHOLHDL, LDLDIRECT in the last 72 hours. ------------------------------------------------------------------------------------------------------------------ No results for input(s): TSH, T4TOTAL, T3FREE, THYROIDAB in the last 72 hours.  Invalid input(s): FREET3 ------------------------------------------------------------------------------------------------------------------ No results for input(s): VITAMINB12, FOLATE, FERRITIN, TIBC, IRON, RETICCTPCT in the last 72 hours.  Coagulation profile  Recent Labs Lab 04/02/15 0455  INR 1.26    No results for input(s): DDIMER in the last 72 hours.  Cardiac Enzymes No results for input(s): CKMB, TROPONINI, MYOGLOBIN in the last 168 hours.  Invalid  input(s): CK ------------------------------------------------------------------------------------------------------------------ Invalid input(s): POCBNP   CBG:  Recent Labs Lab 04/04/15 1530 04/04/15 2019 04/05/15 0051 04/05/15 0436 04/05/15 0814  GLUCAP 116* 111* 126* 121* 112*       Studies: Dg Chest Port 1 View  04/04/2015   CLINICAL DATA:  Rhonchi with concern for aspiration  EXAM: PORTABLE CHEST - 1 VIEW  COMPARISON:  April 03, 2015  FINDINGS: Central catheter tip is in the superior vena cava. Nasogastric tube tip and side port below the diaphragm. No pneumothorax. There is consolidation in the medial left base. There is patchy infiltrate in the right lower lobe, essentially stable allowing for some difference in the degree of inspiration. Lungs elsewhere clear. Heart size and pulmonary vascularity are normal. No adenopathy.  IMPRESSION: Consolidation medial left base. Patchy infiltrate right lower lobe. Aspiration is a differential consideration given these findings. Lungs elsewhere clear. Tube and catheter positions as described  without pneumothorax. No change in cardiac silhouette.   Electronically Signed   By: Lowella Grip III M.D.   On: 04/04/2015 09:08   Dg Abd Portable 1v  04/04/2015   CLINICAL DATA:  Abdominal distention  EXAM: PORTABLE ABDOMEN - 1 VIEW  COMPARISON:  Study obtained earlier in the day  FINDINGS: Nasogastric tube tip and side port are in the stomach. There is moderate generalized bowel dilatation. No air-fluid levels are appreciated. There are multiple surgical clips present. No free air is appreciable on this supine examination.  IMPRESSION: Generalized bowel dilatation in a pattern most suggestive of ileus. No free air appreciable on this supine examination. Nasogastric tube tip and side port in stomach.   Electronically Signed   By: Lowella Grip III M.D.   On: 04/04/2015 09:10   Dg Abd Portable 1v  04/04/2015   CLINICAL DATA:  74 year old male  status post enteric tube placement. Confirm positioning.  EXAM: PORTABLE ABDOMEN - 1 VIEW  COMPARISON:  Radiograph dated 04/03/2015  FINDINGS: An enteric tube is partially visualized with tip over the gastric bubble. There is better distention of the stomach. Multiple dilated air-filled loops of small bowel noted. Right upper quadrant cholecystectomy clips. Surgical clips noted over the midline abdomen.  IMPRESSION: Enteric tube with tip over the gastric bubble.   Electronically Signed   By: Anner Crete M.D.   On: 04/04/2015 01:32   Dg Abd Portable 1v  04/03/2015   CLINICAL DATA:  74 year old male status post enteric tube placement. Initial encounter. Recent exploratory laparotomy with total colectomy for lower GI bleeding.  EXAM: PORTABLE ABDOMEN - 1 VIEW  COMPARISON:  04/02/2015.  FINDINGS: Portable AP supine view at 1504 hours. Enteric tube courses to the left upper quadrant. The tip projects over the the proximal gastric air. The side hole is not identified.  Mild motion artifact. Midline abdominal skin staples and right upper quadrant surgical clips again noted. Stable gas pattern with multiple gas-filled small bowel loops in the mid abdomen. Increased density at the left lung base. See chest radiograph from today reported separately.  IMPRESSION: 1. Enteric tube tip terminates in the stomach. 2. Multiple gas-filled small bowel loops in the upper abdomen may indicate ileus.   Electronically Signed   By: Genevie Jesse M.D.   On: 04/03/2015 15:13      Lab Results  Component Value Date   HGBA1C 6.1* 03/27/2015   Lab Results  Component Value Date   CREATININE 1.20 04/05/2015       Scheduled Meds: . sodium chloride   Intravenous Once  . antiseptic oral rinse  7 mL Mouth Rinse q12n4p  . antiseptic oral rinse  7 mL Mouth Rinse QID  . chlorhexidine  15 mL Mouth Rinse BID  . enoxaparin (LOVENOX) injection  40 mg Subcutaneous Q24H  . feeding supplement (VITAL HIGH PROTEIN)  1,000 mL Per Tube Q24H   . furosemide  20 mg Intravenous Q12H  . Influenza vac split quadrivalent PF  0.5 mL Intramuscular Tomorrow-1000  . insulin aspart  0-9 Units Subcutaneous 6 times per day  . pantoprazole (PROTONIX) IV  40 mg Intravenous Q24H  . piperacillin-tazobactam (ZOSYN)  IV  3.375 g Intravenous Q8H  . potassium chloride  10 mEq Intravenous Q1 Hr x 4  . sodium chloride  3 mL Intravenous Q12H  . THROMBI-PAD  1 each Topical Once  . vancomycin  1,250 mg Intravenous Q12H   Continuous Infusions: . sodium chloride 10 mL/hr at 04/04/15 0927  .  dextrose 5% lactated ringers Stopped (03/31/15 0857)    Principal Problem:   Acute GI bleeding Active Problems:   HTN (hypertension)   Diabetes mellitus type 2, controlled   Dementia   Hiatal hernia   History of diverticulitis   History of prostate cancer   Acute blood loss anemia   Acute renal failure   GI bleeding   Hematochezia   Acute respiratory failure with hypoxia   History of diverticulosis   Aspiration pneumonia    Time spent: 45 minutes   [place] [facility] Pager [phone]. If 7PM-7AM, please contact night-coverage at [email or web address], password [facility] 04/05/2015, 10:15 AM  LOS: 9 days

## 2015-04-05 NOTE — Evaluation (Signed)
Clinical/Bedside Swallow Evaluation Patient Details  Name: Jesse [name] MRN: [id number] Date of Birth: 08-Jan-1941  Today's Date: 04/05/2015 Time: SLP Start Time (ACUTE ONLY): 5188 SLP Stop Time (ACUTE ONLY): 1202 SLP Time Calculation (min) (ACUTE ONLY): 26 min  Past Medical History:  Past Medical History  Diagnosis Date  . Memory loss   . Hypertension   . Diabetes mellitus without complication   . Diverticulosis     Of colon  . Hepatitis C     Virus has never been treated. No history of cirrhosis.  . Prostate cancer ~ 1990s.    Treated with radiation seed implant.  . Hiatal hernia     Does not require meds for reflux symptoms.   Past Surgical History:  Past Surgical History  Procedure Laterality Date  . Laparoscopic cholecystectomy  ~ 2003  . Insertion prostate radiation seed  1990s  . Esophagogastroduodenoscopy N/A 03/29/2015    Procedure: ESOPHAGOGASTRODUODENOSCOPY (EGD);  Surgeon: Jesse Bears, MD;  Location: [facility] ENDOSCOPY;  Service: Endoscopy;  Laterality: N/A;  . Colectomy N/A 03/29/2015    Procedure: EXPLORATORY LAP WITH TOTAL COLECTOMY;  Surgeon: Jesse Ok, MD;  Location: [place];  Service: General;  Laterality: N/A;  . Cystoscopy N/A 03/29/2015    Procedure: CYSTOSCOPY WITH PLACEMENT OF 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 Hepatitis C positive, diverticulosis on colonoscopy, prostate cancer. Pt was admitted on 03/27/15 when the pt was found unresponsive and in shock (hemorhagic shock). MD suspected lower GIB likely diverticular, with hemmorhagic shock, pt had an emergent subtotal colectomy. CXR shows improving lower right lobe infiltrate. Pt was intubated 03/29/15 - 03/30/15, 03/30/15-04/02/15, currently NPO.    Assessment / Plan / Recommendation Clinical Impression  Clinical bedside PO trials this date evidenced  multiple signs and symtpoms of reduced airway protection concerning for aspiration. Vocal quality noted to be hoarse and wet prior to PO trials.  Supsect decreased sensation of pharyngeal and laryngeal areas secondary to recent intubation. Pts swallow noted to be delayed with all PO. Delayed throat clearing and increasing wet vocal quality evidenced with all PO including thin, nectar, and puree trials. Pt unable to follow commands to better protect airway. Recommend MBS prior to diet intiation. Continue NPO and medicines via alternative means until objective swallow study. Will attempt MBS this afternoon.     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 Hepatitis C positive, diverticulosis on colonoscopy, prostate cancer. Pt was admitted on 03/27/15 when the pt was found unresponsive and in shock (hemorhagic shock). MD suspected lower GIB likely diverticular, with hemmorhagic shock, pt had an emergent subtotal colectomy. CXR shows improving lower right lobe infiltrate. Pt was intubated 03/29/15 - 03/30/15, 03/30/15-04/02/15, currently NPO.  Type of Study: Bedside swallow evaluation Diet Prior to this Study: NPO Temperature Spikes  Noted: Yes Respiratory Status: Room air History of Recent Intubation: Yes Length of Intubations (days): 4 days Date extubated: 04/02/15 Behavior/Cognition: Confused;Alert;Requires cueing Oral Cavity - Dentition: Poor condition;Missing dentition Self-Feeding Abilities: Needs assist Patient Positioning: Upright in bed (@75  degrees, combative with fully  upright positioning) Baseline Vocal Quality: Wet;Low vocal intensity;Hoarse Volitional Cough: Weak;Congested Volitional Swallow: Able to elicit    Oral/Motor/Sensory Function Overall Oral Motor/Sensory Function: Appears within functional limits for tasks assessed   Ice Chips Ice chips: Impaired Presentation: Spoon Oral Phase Impairments: Reduced lingual movement/coordination Oral Phase Functional Implications: Prolonged oral transit Pharyngeal Phase Impairments: Suspected delayed Swallow;Wet Vocal Quality;Throat Clearing - Delayed   Thin Liquid Thin Liquid: Impaired Presentation: Spoon Oral Phase Functional Implications: Prolonged oral transit Pharyngeal  Phase Impairments: Suspected delayed Swallow;Multiple [name];Wet Vocal Quality;Throat Clearing - Delayed;Cough - Delayed    Nectar Thick Nectar Thick Liquid: Impaired Presentation: Spoon Oral Phase Impairments: Reduced lingual movement/coordination;Impaired anterior to posterior transit Oral phase functional implications: Prolonged oral transit Pharyngeal Phase Impairments: Suspected delayed Swallow;Multiple [name];Throat Clearing - Delayed;Throat Clearing - Immediate;Cough - Delayed;Wet Vocal Quality   Honey Thick Honey Thick Liquid: Not tested   Puree Puree: Impaired Presentation: Spoon Pharyngeal Phase Impairments: Suspected delayed Swallow;Multiple [name];Wet Vocal Quality;Throat Clearing - Delayed   Solid   GO   Jesse Chaco MA, CCC-SLP Acute Care Speech Language Pathologist     Solid: Not tested       Jesse [name] 04/05/2015,12:03 PM

## 2015-04-06 ENCOUNTER — Inpatient Hospital Stay (HOSPITAL_COMMUNITY): Payer: Medicare (Managed Care)

## 2015-04-06 DIAGNOSIS — R1312 Dysphagia, oropharyngeal phase: Secondary | ICD-10-CM

## 2015-04-06 LAB — COMPREHENSIVE METABOLIC PANEL
ALBUMIN: 2 g/dL — AB (ref 3.5–5.0)
ALK PHOS: 54 U/L (ref 38–126)
ALT: 46 U/L (ref 17–63)
AST: 38 U/L (ref 15–41)
Anion gap: 9 (ref 5–15)
BUN: 8 mg/dL (ref 6–20)
CALCIUM: 8.6 mg/dL — AB (ref 8.9–10.3)
CO2: 32 mmol/L (ref 22–32)
CREATININE: 1.34 mg/dL — AB (ref 0.61–1.24)
Chloride: 106 mmol/L (ref 101–111)
GFR calc Af Amer: 59 mL/min — ABNORMAL LOW (ref >60)
GFR calc non Af Amer: 51 mL/min — ABNORMAL LOW (ref >60)
GLUCOSE: 116 mg/dL — AB (ref 65–99)
Potassium: 3 mmol/L — ABNORMAL LOW (ref 3.5–5.1)
SODIUM: 147 mmol/L — AB (ref 135–145)
Total Bilirubin: 1.1 mg/dL (ref 0.3–1.2)
Total Protein: 5.7 g/dL — ABNORMAL LOW (ref 6.5–8.1)

## 2015-04-06 LAB — GLUCOSE, CAPILLARY
GLUCOSE-CAPILLARY: 111 mg/dL — AB (ref 65–99)
GLUCOSE-CAPILLARY: 99 mg/dL (ref 65–99)
Glucose-Capillary: 113 mg/dL — ABNORMAL HIGH (ref 65–99)
Glucose-Capillary: 126 mg/dL — ABNORMAL HIGH (ref 65–99)
Glucose-Capillary: 94 mg/dL (ref 65–99)
Glucose-Capillary: 99 mg/dL (ref 65–99)

## 2015-04-06 LAB — CBC
HCT: 30.8 % — ABNORMAL LOW (ref 39.0–52.0)
Hemoglobin: 10 g/dL — ABNORMAL LOW (ref 13.0–17.0)
MCH: 30.6 pg (ref 26.0–34.0)
MCHC: 32.5 g/dL (ref 30.0–36.0)
MCV: 94.2 fL (ref 78.0–100.0)
PLATELETS: 421 10*3/uL — AB (ref 150–400)
RBC: 3.27 MIL/uL — ABNORMAL LOW (ref 4.22–5.81)
RDW: 15.8 % — AB (ref 11.5–15.5)
WBC: 10.8 10*3/uL — ABNORMAL HIGH (ref 4.0–10.5)

## 2015-04-06 MED ORDER — FENTANYL CITRATE (PF) 100 MCG/2ML IJ SOLN
25.0000 ug | INTRAMUSCULAR | Status: DC | PRN
  Administered 2015-04-06 – 2015-04-10 (×5): 25 ug via INTRAVENOUS
  Filled 2015-04-06 (×5): qty 2

## 2015-04-06 MED ORDER — POTASSIUM CHLORIDE 10 MEQ/100ML IV SOLN: 10.0000 meq | INTRAVENOUS | Status: DC

## 2015-04-06 MED ORDER — POTASSIUM CHLORIDE 10 MEQ/100ML IV SOLN
10.0000 meq | INTRAVENOUS | Status: AC
  Administered 2015-04-06 (×4): 10 meq via INTRAVENOUS
  Filled 2015-04-06 (×4): qty 100

## 2015-04-06 MED ORDER — SODIUM CHLORIDE 0.9 % IV SOLN
1.5000 g | Freq: Four times a day (QID) | INTRAVENOUS | Status: DC
  Administered 2015-04-06 – 2015-04-14 (×31): 1.5 g via INTRAVENOUS
  Filled 2015-04-06 (×34): qty 1.5

## 2015-04-06 MED ORDER — VITAL HIGH PROTEIN PO LIQD
1000.0000 mL | ORAL | Status: DC
  Administered 2015-04-07 (×2): 1000 mL
  Filled 2015-04-06 (×5): qty 1000

## 2015-04-06 MED ORDER — SODIUM CHLORIDE 0.45 % IV SOLN
INTRAVENOUS | Status: DC
Start: 2015-04-06 — End: 2015-04-14
  Administered 2015-04-06 – 2015-04-14 (×9): via INTRAVENOUS
  Filled 2015-04-06 (×18): qty 1000

## 2015-04-06 NOTE — Progress Notes (Signed)
ANTIBIOTIC CONSULT NOTE - INITIAL  Pharmacy Consult for Unasyn Indication: pneumonia  No Known Allergies  Patient Measurements: Height: 5\' 11"  (180.3 cm) Weight: 210 lb 8.6 oz (95.5 kg) IBW/kg (Calculated) : 75.3  Vital Signs: Temp: 98.6 F (37 C) (09/11 0724) Temp Source: Oral (09/11 0724) BP: 168/100 mmHg (09/11 0724) Pulse Rate: 95 (09/11 0724) Intake/Output from previous day: 09/10 0701 - 09/11 0700 In: 1250 [IV Piggyback:1250] Out: 2725 [Urine:2625; Stool:100] Intake/Output from this shift: Total I/O In: 3 [I.V.:3] Out: 100 [Stool:100]  Labs:  Recent Labs  04/04/15 0430 04/05/15 0510 04/05/15 0812 04/06/15 0530  WBC 12.3*  --  12.1* 10.8*  HGB 10.2*  --  10.2* 10.0*  PLT 344  --  407* 421*  CREATININE 1.07 1.20  --  1.34*   Estimated Creatinine Clearance: 58.8 mL/min (by C-G formula based on Cr of 1.34). No results for input(s): VANCOTROUGH, VANCOPEAK, VANCORANDOM, GENTTROUGH, GENTPEAK, GENTRANDOM, TOBRATROUGH, TOBRAPEAK, TOBRARND, AMIKACINPEAK, AMIKACINTROU, AMIKACIN in the last 72 hours.   Microbiology: Recent Results (from the past 720 hour(s))  MRSA PCR Screening     Status: None   Collection Time: 03/27/15  7:21 PM  Result Value Ref Range Status   MRSA by PCR NEGATIVE NEGATIVE Final    Comment:        The GeneXpert MRSA Assay (FDA approved for NASAL specimens only), is one component of a comprehensive MRSA colonization surveillance program. It is not intended to diagnose MRSA infection nor to guide or monitor treatment for MRSA infections.   Culture, respiratory (NON-Expectorated)     Status: None   Collection Time: 03/30/15  4:08 PM  Result Value Ref Range Status   Specimen Description TRACHEAL ASPIRATE  Final   Special Requests Normal  Final   Gram Stain   Final    ABUNDANT WBC PRESENT,BOTH PMN AND MONONUCLEAR NO SQUAMOUS EPITHELIAL CELLS SEEN MODERATE GRAM POSITIVE COCCI IN PAIRS RARE GRAM NEGATIVE RODS RARE GRAM POSITIVE COCCI    Culture   Final    MODERATE STREPTOCOCCUS PNEUMONIAE Performed at [facility]    Report Status 04/03/2015 FINAL  Final   Organism ID, Bacteria STREPTOCOCCUS PNEUMONIAE  Final      Susceptibility   Streptococcus pneumoniae - MIC (ETEST)*    CEFTRIAXONE 0.25 SENSITIVE Sensitive     LEVOFLOXACIN 0.75 SENSITIVE Sensitive     PENICILLIN 1.0 INTERMEDIATE Intermediate     * MODERATE STREPTOCOCCUS PNEUMONIAE  Culture, Urine     Status: None   Collection Time: 03/31/15 10:45 AM  Result Value Ref Range Status   Specimen Description URINE, CATHETERIZED  Final   Special Requests NONE  Final   Culture NO GROWTH 1 DAY  Final   Report Status 04/01/2015 FINAL  Final  Culture, blood (routine x 2)     Status: None   Collection Time: 03/31/15 10:55 AM  Result Value Ref Range Status   Specimen Description BLOOD LEFT THUMB  Final   Special Requests IN PEDIATRIC BOTTLE 1CC  Final   Culture NO GROWTH 5 DAYS  Final   Report Status 04/05/2015 FINAL  Final  Culture, respiratory (NON-Expectorated)     Status: None   Collection Time: 04/01/15  8:56 AM  Result Value Ref Range Status   Specimen Description TRACHEAL ASPIRATE  Final   Special Requests NONE  Final   Gram Stain   Final    ABUNDANT WBC PRESENT,BOTH PMN AND MONONUCLEAR NO SQUAMOUS EPITHELIAL CELLS SEEN ABUNDANT GRAM POSITIVE COCCI IN PAIRS Performed at [facility]    Culture   Final    NO GROWTH 2 DAYS Performed at [facility]    Report Status 04/03/2015 FINAL  Final    Medical History: Past Medical History  Diagnosis Date  . Memory loss   . Hypertension   . Diabetes mellitus without complication   . Diverticulosis     Of colon  . Hepatitis C     Virus has never been treated. No history of cirrhosis.  . Prostate cancer ~ 1990s.    Treated with radiation seed implant.  . Hiatal hernia     Does not require meds for reflux symptoms.    Medications:  Prescriptions prior to admission  Medication Sig  Dispense Refill Last Dose  . lisinopril (PRINIVIL,ZESTRIL) 20 MG tablet Take 20 mg by mouth daily.   03/26/2015 at Unknown time  . metFORMIN (GLUCOPHAGE) 500 MG tablet Take 500 mg by mouth 2 (two) times daily with a meal.   03/26/2015 at Unknown time   Scheduled:  . ampicillin-sulbactam (UNASYN) IV  1.5 g Intravenous Q6H  . antiseptic oral rinse  7 mL Mouth Rinse q12n4p  . chlorhexidine  15 mL Mouth Rinse BID  . enoxaparin (LOVENOX) injection  40 mg Subcutaneous Q24H  . Influenza vac split quadrivalent PF  0.5 mL Intramuscular Tomorrow-1000  . insulin aspart  0-9 Units Subcutaneous 6 times per day  . pantoprazole (PROTONIX) IV  40 mg Intravenous Q24H  . potassium chloride  10 mEq Intravenous Q1 Hr x 4  . sodium chloride  3 mL Intravenous Q12H  . THROMBI-PAD  1 each Topical Once   Assessment: 74 yo M admitted 9/1 with severe GIB requiring emergent ex-lap and total colectomy. Pt was previously on Rocephin for strep pneumo PNA (based on 9/4 cx data). Pt then became tachycardic with increase in WBC. High residuals on TF -> Suspect aspiration PNA vs HCAP. Abx were changed to Vancomycin and Zosyn, now being narrowed to Unasyn.  WBC 10.8, tachycardia. Tm 99.2 F, SCr 1.34. Normalized CrCl ~ 50 mL/min.  Unasyn 9/11 >>  Vanc 9/5>>9/6, restart 9/9 >> 9/10 Zosyn 9/5>>9/7, restart 9/9 >> 9/11 Ceftriaxone 9/7>> 9/8   9/4 Resp Cx>>abundant GPC in pairs>> strep pneumo 9/5 BCx2>>NG 9/5 UCx>> NG 9/6 Resp Cx>>NG MRSA PCR neg  Goal of Therapy:  Resolution of infection  Plan:  Unasyn 1.5 g q6h F/U LOT, cultures, clinical improvement  Governor Specking, PharmD Clinical Pharmacy Resident Pager: [phone] 04/06/2015,11:17 AM

## 2015-04-06 NOTE — Progress Notes (Addendum)
[facility] PROGRESS NOTE  Jesse [name] RSW:[id number] DOB: 01-20-1941 DOA: 03/27/2015  PCP: No primary care provider on file.  Brief HPI: 74 year old African-American male with a past medical history of dementia, type 2 diabetes, essential hypertension, hepatitis C, and a known history of diverticulosis, presented with massive GI bleeding. This required emergent total colectomy.  Past medical history:  Past Medical History  Diagnosis Date  . Memory loss   . Hypertension   . Diabetes mellitus without complication   . Diverticulosis     Of colon  . Hepatitis C     Virus has never been treated. No history of cirrhosis.  . Prostate cancer ~ 1990s.    Treated with radiation seed implant.  . Hiatal hernia     Does not require meds for reflux symptoms.    Consultants: Gen. surgery  Procedures:   Emergent exploratory laparotomy with total colectomy on 9/3.  EGD 9/3 which was normal with no source of GI bleeding  Cystoscopy and insertion of Foley catheter and bladder irrigation 9/3  Antibiotics: Has been on vancomycin and Zosyn. His completed treatment for strep pneumoniae. Now being treated for aspiration pneumonia. Unasyn initiated 9/11  Subjective: Patient pleasantly confused. Denies any discomfort.  Objective: Vital Signs  Filed Vitals:   04/06/15 0000 04/06/15 0400 04/06/15 0500 04/06/15 0724  BP: 142/91 142/82  168/100  Pulse: 114 97  95  Temp:  98 F (36.7 C)  98.6 F (37 C)  TempSrc:  Axillary  Oral  Resp: 20 28  16   Height:      Weight:   95.5 kg (210 lb 8.6 oz)   SpO2: 100% 100%  100%    Intake/Output Summary (Last 24 hours) at 04/06/15 0810 Last data filed at 04/06/15 0500  Gross per 24 hour  Intake   1250 ml  Output   2550 ml  Net  -1300 ml   Filed Weights   04/04/15 0500 04/05/15 0400 04/06/15 0500  Weight: 100.7 kg (222 lb 0.1 oz) 98.4 kg (216 lb 14.9 oz) 95.5 kg (210 lb 8.6 oz)    General appearance: alert, distracted and no  distress Resp: Diminished air entry at the bases. No crackles or wheezing. Cardio: regular rate and rhythm, S1, S2 normal, no murmur, click, rub or gallop GI: Covered with dressing. Tender to palpation. Bowel sounds sluggish. Extremities: extremities normal, atraumatic, no cyanosis or edema Neurologic: Alert. Distracted. Disoriented to time, place, person. No facial asymmetry. Able to move all his extremities without difficulties. No focal deficits identified.  Lab Results:  Basic Metabolic Panel:  Recent Labs Lab 03/31/15 0443 04/01/15 0415 04/02/15 0455 04/03/15 0317 04/04/15 0430 04/05/15 0510 04/05/15 0812 04/06/15 0530  NA 142 140 141 141 144 145  --  147*  K 3.7 3.7 3.4* 3.6 3.7 3.0*  --  3.0*  CL 117* 114* 110 111 105 104  --  106  CO2 22 23 21* 25 26 31   --  32  GLUCOSE 131* 134* 139* 140* 123* 121*  --  116*  BUN 10 12 14 11 6 7   --  8  CREATININE 1.10 1.22 1.29* 0.97 1.07 1.20  --  1.34*  CALCIUM 7.4* 7.4* 7.6* 8.0* 7.8* 8.5*  --  8.6*  MG 1.8 2.1  --   --   --   --  2.1  --   PHOS 1.8* 2.1*  --   --   --   --   --   --  Liver Function Tests:  Recent Labs Lab 04/02/15 0455 04/05/15 0510 04/06/15 0530  AST 34 39 38  ALT 24 46 46  ALKPHOS 35* 58 54  BILITOT 0.4 1.0 1.1  PROT 5.4* 5.5* 5.7*  ALBUMIN 1.5* 2.0* 2.0*   CBC:  Recent Labs Lab 04/02/15 0455 04/03/15 0317 04/03/15 0802 04/04/15 0430 04/05/15 0812 04/06/15 0530  WBC 8.4 7.2 8.5 12.3* 12.1* 10.8*  NEUTROABS 6.0 5.0  --  10.0*  --   --   HGB 7.0* 6.7* 8.2* 10.2* 10.2* 10.0*  HCT 21.9* 20.5* 25.1* 30.8* 31.5* 30.8*  MCV 90.5 90.2 90.9 90.3 92.4 94.2  PLT 160 212 222 344 407* 421*    CBG:  Recent Labs Lab 04/05/15 1629 04/05/15 1957 04/05/15 2354 04/06/15 0518 04/06/15 0729  GLUCAP 115* 111* 126* 111* 113*    Recent Results (from the past 240 hour(s))  MRSA PCR Screening     Status: None   Collection Time: 03/27/15  7:21 PM  Result Value Ref Range Status   MRSA by PCR  NEGATIVE NEGATIVE Final    Comment:        The GeneXpert MRSA Assay (FDA approved for NASAL specimens only), is one component of a comprehensive MRSA colonization surveillance program. It is not intended to diagnose MRSA infection nor to guide or monitor treatment for MRSA infections.   Culture, respiratory (NON-Expectorated)     Status: None   Collection Time: 03/30/15  4:08 PM  Result Value Ref Range Status   Specimen Description TRACHEAL ASPIRATE  Final   Special Requests Normal  Final   Gram Stain   Final    ABUNDANT WBC PRESENT,BOTH PMN AND MONONUCLEAR NO SQUAMOUS EPITHELIAL CELLS SEEN MODERATE GRAM POSITIVE COCCI IN PAIRS RARE GRAM NEGATIVE RODS RARE GRAM POSITIVE COCCI    Culture   Final    MODERATE STREPTOCOCCUS PNEUMONIAE Performed at [facility]    Report Status 04/03/2015 FINAL  Final   Organism ID, Bacteria STREPTOCOCCUS PNEUMONIAE  Final      Susceptibility   Streptococcus pneumoniae - MIC (ETEST)*    CEFTRIAXONE 0.25 SENSITIVE Sensitive     LEVOFLOXACIN 0.75 SENSITIVE Sensitive     PENICILLIN 1.0 INTERMEDIATE Intermediate     * MODERATE STREPTOCOCCUS PNEUMONIAE  Culture, Urine     Status: None   Collection Time: 03/31/15 10:45 AM  Result Value Ref Range Status   Specimen Description URINE, CATHETERIZED  Final   Special Requests NONE  Final   Culture NO GROWTH 1 DAY  Final   Report Status 04/01/2015 FINAL  Final  Culture, blood (routine x 2)     Status: None   Collection Time: 03/31/15 10:55 AM  Result Value Ref Range Status   Specimen Description BLOOD LEFT THUMB  Final   Special Requests IN PEDIATRIC BOTTLE 1CC  Final   Culture NO GROWTH 5 DAYS  Final   Report Status 04/05/2015 FINAL  Final  Culture, respiratory (NON-Expectorated)     Status: None   Collection Time: 04/01/15  8:56 AM  Result Value Ref Range Status   Specimen Description TRACHEAL ASPIRATE  Final   Special Requests NONE  Final   Gram Stain   Final    ABUNDANT WBC  PRESENT,BOTH PMN AND MONONUCLEAR NO SQUAMOUS EPITHELIAL CELLS SEEN ABUNDANT GRAM POSITIVE COCCI IN PAIRS Performed at [facility]    Culture   Final    NO GROWTH 2 DAYS Performed at [facility]    Report Status 04/03/2015 FINAL  Final      Studies/Results: Dg Chest Port 1 View  04/04/2015   CLINICAL DATA:  Rhonchi with concern for aspiration  EXAM: PORTABLE CHEST - 1 VIEW  COMPARISON:  April 03, 2015  FINDINGS: Central catheter tip is in the superior vena cava. Nasogastric tube tip and side port below the diaphragm. No pneumothorax. There is consolidation in the medial left base. There is patchy infiltrate in the right lower lobe, essentially stable allowing for some difference in the degree of inspiration. Lungs elsewhere clear. Heart size and pulmonary vascularity are normal. No adenopathy.  IMPRESSION: Consolidation medial left base. Patchy infiltrate right lower lobe. Aspiration is a differential consideration given these findings. Lungs elsewhere clear. Tube and catheter positions as described without pneumothorax. No change in cardiac silhouette.   Electronically Signed   By: Lowella Grip III M.D.   On: 04/04/2015 09:08   Dg Abd Portable 1v  04/04/2015   CLINICAL DATA:  Abdominal distention  EXAM: PORTABLE ABDOMEN - 1 VIEW  COMPARISON:  Study obtained earlier in the day  FINDINGS: Nasogastric tube tip and side port are in the stomach. There is moderate generalized bowel dilatation. No air-fluid levels are appreciated. There are multiple surgical clips present. No free air is appreciable on this supine examination.  IMPRESSION: Generalized bowel dilatation in a pattern most suggestive of ileus. No free air appreciable on this supine examination. Nasogastric tube tip and side port in stomach.   Electronically Signed   By: Lowella Grip III M.D.   On: 04/04/2015 09:10   Dg Swallowing Func-speech Pathology  04/05/2015    Objective Swallowing Evaluation:    Patient  Details  Name: Medhansh Brinkmeier MRN: [id number] Date of Birth: 03-Feb-1941  Today's Date: 04/05/2015 Time: SLP Start Time (ACUTE ONLY): 1555-SLP Stop Time (ACUTE ONLY): 1615 SLP Time Calculation (min) (ACUTE ONLY): 20 min  Past Medical History:  Past Medical History  Diagnosis Date  . Memory loss   . Hypertension   . Diabetes mellitus without complication   . Diverticulosis     Of colon  . Hepatitis C     Virus has never been treated. No history of cirrhosis.  . Prostate cancer ~ 1990s.    Treated with radiation seed implant.  . Hiatal hernia     Does not require meds for reflux symptoms.   Past Surgical History:  Past Surgical History  Procedure Laterality Date  . Laparoscopic cholecystectomy  ~ 2003  . Insertion prostate radiation seed  1990s  . Esophagogastroduodenoscopy N/A 03/29/2015    Procedure: ESOPHAGOGASTRODUODENOSCOPY (EGD);  Surgeon: Jerene Bears, MD;   Location: [facility] ENDOSCOPY;  Service: Endoscopy;  Laterality: N/A;  . Colectomy N/A 03/29/2015    Procedure: EXPLORATORY LAP WITH TOTAL COLECTOMY;  Surgeon: Ralene Ok, MD;  Location: [place];  Service: General;  Laterality: N/A;  . Cystoscopy N/A 03/29/2015    Procedure: CYSTOSCOPY WITH PLACEMENT OF [address]   Surgeon: Ralene Ok, MD;  Location: [place];  Service: General;   Laterality: N/A;   HPI:  Other Pertinent Information: Pt is a 74 yo male with a PMH of dementia,  type 2 DM treated with oral agents, HTN. Hepatitis C positive,  diverticulosis on colonoscopy, prostate cancer. Pt was admitted on 03/27/15  when the pt was found unresponsive and in shock (hemorhagic shock). MD  suspected lower GIB likely diverticular, with hemmorhagic shock, pt had an  emergent subtotal colectomy. CXR shows improving lower right lobe  infiltrate. Pt  was intubated 03/29/15 - 03/30/15, 03/30/15-04/02/15, currently  NPO.   No Data Recorded  Assessment / Plan / Recommendation CHL IP CLINICAL IMPRESSIONS 04/05/2015  Therapy Diagnosis Mild oral phase dysphagia;Moderate  pharyngeal phase  dysphagia;Severe pharyngeal phase dysphagia  Clinical Impression MBSS was completed.  The patient presented with mild  oral and mod-severe pharyngeal dysphagia characterized by motor sensotry  deficits.  The patient demonstrated delayed oral transit, poor bolus  cohesion with premature spill of material with some oral residuals,  delayed swallow trigger, decreased epiglottic inversion, poor airway  closure and decreased constriction with vallecular and pyriform residue.   Silent penetration of all textures was noted during the swallow.   Compensatory strategies were not attempted due to the patient's confusion.   Safest diet is NPO.  ST will follow for trial swallowing therapy which  will be dependent upon the patient's ability to follow commands.        CHL IP TREATMENT RECOMMENDATION 04/05/2015  Treatment Recommendations Therapy as outlined in treatment plan below     CHL IP DIET RECOMMENDATION 04/05/2015  SLP Diet Recommendations NPO  Liquid Administration via (None)  Medication Administration Via alternative means  Compensations (None)  Postural Changes and/or Swallow Maneuvers (None)     CHL IP OTHER RECOMMENDATIONS 04/05/2015  Recommended Consults (None)  Oral Care Recommendations Oral care QID  Other Recommendations (None)     No flowsheet data found.   CHL IP FREQUENCY AND DURATION 04/05/2015  Speech Therapy Frequency (ACUTE ONLY) min 2x/week  Treatment Duration 2 weeks     Pertinent Vitals/Pain No active distress.                        Lamar Sprinkles 04/05/2015, 4:22 PM  Shelly Flatten, MA, CCC-SLP Acute Rehab SLP [phone]      Medications:  Scheduled: . ampicillin-sulbactam (UNASYN) IV  1.5 g Intravenous Q6H  . antiseptic oral rinse  7 mL Mouth Rinse q12n4p  . chlorhexidine  15 mL Mouth Rinse BID  . enoxaparin (LOVENOX) injection  40 mg Subcutaneous Q24H  . Influenza vac split quadrivalent PF  0.5 mL Intramuscular Tomorrow-1000  . insulin aspart  0-9 Units Subcutaneous 6 times  per day  . pantoprazole (PROTONIX) IV  40 mg Intravenous Q24H  . potassium chloride  10 mEq Intravenous Q1 Hr x 4  . sodium chloride  3 mL Intravenous Q12H  . THROMBI-PAD  1 each Topical Once   Continuous: . sodium chloride 0.45 % 1,000 mL with potassium chloride 20 mEq infusion 75 mL/hr at 04/06/15 0941   CWC:[id number] **OR** acetaminophen, fentaNYL (SUBLIMAZE) injection, hydrALAZINE, ondansetron **OR** ondansetron (ZOFRAN) IV  Assessment/Plan:  Principal Problem:   Acute GI bleeding Active Problems:   HTN (hypertension)   Diabetes mellitus type 2, controlled   Dementia   Hiatal hernia   History of diverticulitis   History of prostate cancer   Acute blood loss anemia   Acute renal failure   GI bleeding   Hematochezia   Acute respiratory failure with hypoxia   History of diverticulosis   Aspiration pneumonia    Aspiration pneumonia Patient was initially on vancomycin and Zosyn. He was transitioned to ceftriaxone on 9/8. There was increase in white count to 12 on 9/9 when he was changed back to vancomycin and Zosyn. WBC is improved. He remains afebrile. He is completed 7 days of treatment for strep pneumoniae identified on sputum culture. Will be transitioned to Unasyn.  Acute massive diverticular bleed, status  post total colectomy General surgery is following. Pain medications as needed.  Dysphagia Seen by speech therapy. Patient underwent modified barium swallow 9/10. His dysphagia appears to be oropharyngeal. He is noted to have a history of dementia, which could be contributing. Does not have any focal neurological deficits. Proceed with nasal feeding tube for now.  Acute encephalopathy This is in the setting of baseline dementia. Baseline mental status is not entirely clear. We will need to discuss with family. His TSH, B-12 level was normal. We will check RPR and HIV. We will proceed with MRI brain.  Hypokalemia Will be repleted intravenously  Acute blood loss  anemia Secondary to GI bleeding. Patient was transfused PRBC. Hemoglobin is stable  History of type 2 diabetes Continue monitoring CBGs. Continue sliding scale coverage.  History of essential hypertension Blood pressures noted to be elevated at times. Hydralazine as needed   DVT Prophylaxis: Lovenox    Code Status: Full code  Family Communication: No family at bedside  Disposition Plan: Remain in step down for now. Await MRI. Nasal feeding tube to be placed     LOS: 10 days   [facility] Pager [phone] 04/06/2015, 8:10 AM  If 7PM-7AM, please contact night-coverage at [email or web address], password [facility]

## 2015-04-06 NOTE — Progress Notes (Signed)
Brief Nutrition Note  Consult received for enteral/tube feeding initiation and management.  Adult Enteral Nutrition Protocol initiated. Full assessment to follow.  Admitting Dx: Blood in Stools hematochezia and anemia hematochezia and anemia.  Body mass index is 29.38 kg/(m^2). Pt meets criteria for overweight based on current BMI.  Labs:   Recent Labs Lab 03/31/15 0443 04/01/15 0415  04/04/15 0430 04/05/15 0510 04/05/15 0812 04/06/15 0530  NA 142 140  < > 144 145  --  147*  K 3.7 3.7  < > 3.7 3.0*  --  3.0*  CL 117* 114*  < > 105 104  --  106  CO2 22 23  < > 26 31  --  32  BUN 10 12  < > 6 7  --  8  CREATININE 1.10 1.22  < > 1.07 1.20  --  1.34*  CALCIUM 7.4* 7.4*  < > 7.8* 8.5*  --  8.6*  MG 1.8 2.1  --   --   --  2.1  --   PHOS 1.8* 2.1*  --   --   --   --   --   GLUCOSE 131* 134*  < > 123* 121*  --  116*  < > = values in this interval not displayed.  Clayton Bibles, MS, RD, LDN Pager: [phone] After Hours Pager: [phone]

## 2015-04-06 NOTE — Progress Notes (Signed)
Patient ID: Jesse [name], male   DOB: 1940/12/22, 74 y.o.   MRN: [id number]   LOS: 10 days   POD#8  Subjective: [place], denies N/V   Objective: Vital signs in last 24 hours: Temp:  [98 F (36.7 C)-99.2 F (37.3 C)] 98.6 F (37 C) (09/11 0724) Pulse Rate:  [95-114] 95 (09/11 0724) Resp:  [14-28] 16 (09/11 0724) BP: (142-168)/(82-111) 168/100 mmHg (09/11 0724) SpO2:  [95 %-100 %] 100 % (09/11 0724) Weight:  [95.5 kg (210 lb 8.6 oz)] 95.5 kg (210 lb 8.6 oz) (09/11 0500) Last BM Date: 04/05/15   Laboratory  CBC  Recent Labs  04/05/15 0812 04/06/15 0530  WBC 12.1* 10.8*  HGB 10.2* 10.0*  HCT 31.5* 30.8*  PLT 407* 421*   BMET  Recent Labs  04/05/15 0510 04/06/15 0530  NA 145 147*  K 3.0* 3.0*  CL 104 106  CO2 31 32  GLUCOSE 121* 116*  BUN 7 8  CREATININE 1.20 1.34*  CALCIUM 8.5* 8.6*    Physical Exam General appearance: no distress Resp: clear to auscultation bilaterally Cardio: regular rate and rhythm GI: Soft, incision C/D/I, ostomy functional   Assessment/Plan: GIB s/p ex lap, total colectomy -- Failed swallow yesterday, would recommend feeding tube and enteral feeds at this point. Will leave decision to primary team. Will increase IVF for hydration. WBC better but Cr rose, IVF may improve that.    Lisette Abu, PA-C Pager: [phone] 04/06/2015

## 2015-04-07 ENCOUNTER — Inpatient Hospital Stay (HOSPITAL_COMMUNITY): Payer: Medicare (Managed Care)

## 2015-04-07 DIAGNOSIS — E876 Hypokalemia: Secondary | ICD-10-CM | POA: Clinically undetermined

## 2015-04-07 DIAGNOSIS — R1312 Dysphagia, oropharyngeal phase: Secondary | ICD-10-CM | POA: Diagnosis present

## 2015-04-07 LAB — CBC
HEMATOCRIT: 31.4 % — AB (ref 39.0–52.0)
HEMOGLOBIN: 9.8 g/dL — AB (ref 13.0–17.0)
MCH: 29.4 pg (ref 26.0–34.0)
MCHC: 31.2 g/dL (ref 30.0–36.0)
MCV: 94.3 fL (ref 78.0–100.0)
Platelets: 437 10*3/uL — ABNORMAL HIGH (ref 150–400)
RBC: 3.33 MIL/uL — ABNORMAL LOW (ref 4.22–5.81)
RDW: 15.8 % — ABNORMAL HIGH (ref 11.5–15.5)
WBC: 10 10*3/uL (ref 4.0–10.5)

## 2015-04-07 LAB — GLUCOSE, CAPILLARY
GLUCOSE-CAPILLARY: 100 mg/dL — AB (ref 65–99)
GLUCOSE-CAPILLARY: 103 mg/dL — AB (ref 65–99)
GLUCOSE-CAPILLARY: 104 mg/dL — AB (ref 65–99)
GLUCOSE-CAPILLARY: 111 mg/dL — AB (ref 65–99)
Glucose-Capillary: 111 mg/dL — ABNORMAL HIGH (ref 65–99)
Glucose-Capillary: 115 mg/dL — ABNORMAL HIGH (ref 65–99)
Glucose-Capillary: 119 mg/dL — ABNORMAL HIGH (ref 65–99)

## 2015-04-07 LAB — BASIC METABOLIC PANEL
ANION GAP: 9 (ref 5–15)
BUN: 9 mg/dL (ref 6–20)
CHLORIDE: 107 mmol/L (ref 101–111)
CO2: 28 mmol/L (ref 22–32)
Calcium: 8.6 mg/dL — ABNORMAL LOW (ref 8.9–10.3)
Creatinine, Ser: 1.26 mg/dL — ABNORMAL HIGH (ref 0.61–1.24)
GFR calc non Af Amer: 55 mL/min — ABNORMAL LOW (ref >60)
Glucose, Bld: 108 mg/dL — ABNORMAL HIGH (ref 65–99)
Potassium: 3.2 mmol/L — ABNORMAL LOW (ref 3.5–5.1)
Sodium: 144 mmol/L (ref 135–145)

## 2015-04-07 LAB — HIV ANTIBODY (ROUTINE TESTING W REFLEX): HIV SCREEN 4TH GENERATION: NONREACTIVE

## 2015-04-07 LAB — RPR: RPR Ser Ql: NONREACTIVE

## 2015-04-07 MED ORDER — SODIUM CHLORIDE 0.9 % IV SOLN: INTRAVENOUS | Status: DC

## 2015-04-07 MED ORDER — POTASSIUM CHLORIDE 10 MEQ/100ML IV SOLN
10.0000 meq | INTRAVENOUS | Status: AC
Start: 2015-04-07 — End: 2015-04-07
  Administered 2015-04-07 (×4): 10 meq via INTRAVENOUS
  Filled 2015-04-07 (×4): qty 100

## 2015-04-07 MED ORDER — VITAL AF 1.2 CAL PO LIQD
1000.0000 mL | ORAL | Status: DC
  Administered 2015-04-07 – 2015-04-09 (×2): 1000 mL
  Filled 2015-04-07 (×7): qty 1000

## 2015-04-07 NOTE — Progress Notes (Signed)
Physical Therapy Treatment Patient Details Name: Jesse [name] MRN: [id number] DOB: January 21, 1941 Today's Date: 04/07/2015    History of Present Illness Pt is a 74 y/o male with a PMH of dementia (wife reports sundowning), DMII, HTN, hep-C. Pt presents with GI bleed requiring emergent subtotal colectomy.     PT Comments    Progressing with gait and stability, but still very distractable, needing constant redirection.   Follow Up Recommendations  SNF;Other (comment) (pt's wife would like desination as home.)     Equipment Recommendations  Rolling walker with 5" wheels    Recommendations for Other Services       Precautions / Restrictions Precautions Precautions: Fall Precaution Comments: Colostomy, NG tube, Abd incision. 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 Needed 6 people to restrain him at one point.    Mobility  Bed Mobility Overal bed mobility: Needs Assistance Bed Mobility: Supine to Sit;Sit to Supine     Supine to sit: Mod assist     General bed mobility comments: cues for guidance and direction  Transfers Overall transfer level: Needs assistance   Transfers: Sit to/from Stand Sit to Stand: Min assist;+2 physical assistance            Ambulation/Gait Ambulation/Gait assistance: Min assist;+2 physical assistance Ambulation Distance (Feet): 180 Feet Assistive device: Rolling walker (2 wheeled) Gait Pattern/deviations: Step-through pattern;Staggering right;Drifts right/left;Scissoring   Gait velocity interpretation: Below normal speed for age/gender General Gait Details: more than anything, pt needs redirection to task or help keeping him approximated to the RW   Stairs            Wheelchair Mobility    Modified Rankin (Stroke Patients Only)       Balance Overall balance assessment: Needs assistance Sitting-balance support: No upper extremity supported Sitting balance-Leahy Scale: Fair      Standing balance support: No upper extremity supported;Single extremity supported Standing balance-Leahy Scale: Poor Standing balance comment: needs steady assist                    Cognition Arousal/Alertness: Awake/alert Behavior During Therapy: Restless;Flat affect Overall Cognitive Status: History of cognitive impairments - at baseline                      Exercises      General Comments        Pertinent Vitals/Pain Pain Assessment: Faces Faces Pain Scale: No hurt    Home Living                      Prior Function            PT Goals (current goals can now be found in the care plan section) Acute Rehab PT Goals PT Goal Formulation: With patient Time For Goal Achievement: 04/16/15 Potential to Achieve Goals: Good Progress towards PT goals: Progressing toward goals    Frequency  Min 3X/week    PT Plan Current plan remains appropriate    Co-evaluation             End of Session   Activity Tolerance: Patient tolerated treatment well Patient left: in chair;with call bell/phone within reach;with chair alarm set;with restraints reapplied     Time: 1435-1501 PT Time Calculation (min) (ACUTE ONLY): 26 min  Charges:  $Gait Training: 8-22 mins $Therapeutic Activity: 8-22 mins  G Codes:      Melesio Madara, [name] 04/07/2015, 5:22 PM 04/07/2015  Donnella Sham, [place] [phone]  (pager)

## 2015-04-07 NOTE — Progress Notes (Signed)
Panda tube placed without incident. Sats maintained by patient. Bailey Mech, RN and Leana Roe, RN at bedside for assistance. Placement verified via auscultation. Will order KUB to verify placement.

## 2015-04-07 NOTE — Clinical Social Work Note (Signed)
Due to the pt's out of state insurance he does not have any beds offer presently. CSW has extended the SNF search. CSW will work alongside the Pensions consultant to aid in placement.  [place], MSW, [place]

## 2015-04-07 NOTE — Progress Notes (Signed)
[facility] PROGRESS NOTE  Jesse [name] ZOX:[id number] DOB: 10/14/1940 DOA: 03/27/2015  PCP: No primary care provider on file.  Brief HPI: 74 year old African-American male with a past medical history of dementia, type 2 diabetes, essential hypertension, hepatitis C, and a known history of diverticulosis, presented with massive GI bleeding. This required emergent total colectomy.  Past medical history:  Past Medical History  Diagnosis Date  . Memory loss   . Hypertension   . Diabetes mellitus without complication   . Diverticulosis     Of colon  . Hepatitis C     Virus has never been treated. No history of cirrhosis.  . Prostate cancer ~ 1990s.    Treated with radiation seed implant.  . Hiatal hernia     Does not require meds for reflux symptoms.    Consultants: Gen. surgery  Procedures:   Emergent exploratory laparotomy with total colectomy on 9/3.  EGD 9/3 which was normal with no source of GI bleeding  Cystoscopy and insertion of Foley catheter and bladder irrigation 9/3  Antibiotics: Had been on vancomycin and Zosyn. Has completed treatment for strep pneumoniae. Now being treated for aspiration pneumonia.  Unasyn initiated 9/11  Subjective: Patient pleasantly confused. Denies any discomfort. His wife is at bedside.  Objective: Vital Signs  Filed Vitals:   04/06/15 1521 04/06/15 2000 04/07/15 0000 04/07/15 0400  BP: 161/94 164/83 123/95 160/90  Pulse: 102 100 95 100  Temp:  98.6 F (37 C) 99.3 F (37.4 C) 98.6 F (37 C)  TempSrc:  Oral Oral Oral  Resp: 15 15 18 15   Height:    5\' 11"  (1.803 m)  Weight:    92.7 kg (204 lb 5.9 oz)  SpO2: 100% 99% 97% 100%    Intake/Output Summary (Last 24 hours) at 04/07/15 0741 Last data filed at 04/07/15 0600  Gross per 24 hour  Intake 2096.75 ml  Output   1725 ml  Net 371.75 ml   Filed Weights   04/05/15 0400 04/06/15 0500 04/07/15 0400  Weight: 98.4 kg (216 lb 14.9 oz) 95.5 kg (210 lb 8.6 oz) 92.7 kg (204  lb 5.9 oz)    General appearance: alert, distracted and no distress Resp: Diminished air entry at the bases. No crackles or wheezing. Cardio: regular rate and rhythm, S1, S2 normal, no murmur, click, rub or gallop GI: Covered with dressing. Tender to palpation. Bowel sounds sluggish. Neurologic: Alert. Distracted. Disoriented to time, place, person. No facial asymmetry. Able to move all his extremities without difficulties. No focal deficits identified.  Lab Results:  Basic Metabolic Panel:  Recent Labs Lab 04/01/15 0415  04/03/15 0317 04/04/15 0430 04/05/15 0510 04/05/15 0812 04/06/15 0530 04/07/15 0525  NA 140  < > 141 144 145  --  147* 144  K 3.7  < > 3.6 3.7 3.0*  --  3.0* 3.2*  CL 114*  < > 111 105 104  --  106 107  CO2 23  < > 25 26 31   --  32 28  GLUCOSE 134*  < > 140* 123* 121*  --  116* 108*  BUN 12  < > 11 6 7   --  8 9  CREATININE 1.22  < > 0.97 1.07 1.20  --  1.34* 1.26*  CALCIUM 7.4*  < > 8.0* 7.8* 8.5*  --  8.6* 8.6*  MG 2.1  --   --   --   --  2.1  --   --   PHOS 2.1*  --   --   --   --   --   --   --   < > =  values in this interval not displayed. Liver Function Tests:  Recent Labs Lab 04/02/15 0455 04/05/15 0510 04/06/15 0530  AST 34 39 38  ALT 24 46 46  ALKPHOS 35* 58 54  BILITOT 0.4 1.0 1.1  PROT 5.4* 5.5* 5.7*  ALBUMIN 1.5* 2.0* 2.0*   CBC:  Recent Labs Lab 04/02/15 0455 04/03/15 0317 04/03/15 0802 04/04/15 0430 04/05/15 0812 04/06/15 0530 04/07/15 0525  WBC 8.4 7.2 8.5 12.3* 12.1* 10.8* 10.0  NEUTROABS 6.0 5.0  --  10.0*  --   --   --   HGB 7.0* 6.7* 8.2* 10.2* 10.2* 10.0* 9.8*  HCT 21.9* 20.5* 25.1* 30.8* 31.5* 30.8* 31.4*  MCV 90.5 90.2 90.9 90.3 92.4 94.2 94.3  PLT 160 212 222 344 407* 421* 437*    CBG:  Recent Labs Lab 04/06/15 1151 04/06/15 1506 04/06/15 2110 04/07/15 0027 04/07/15 0457  GLUCAP 99 94 99 100* 103*    Recent Results (from the past 240 hour(s))  Culture, respiratory (NON-Expectorated)     Status: None    Collection Time: 03/30/15  4:08 PM  Result Value Ref Range Status   Specimen Description TRACHEAL ASPIRATE  Final   Special Requests Normal  Final   Gram Stain   Final    ABUNDANT WBC PRESENT,BOTH PMN AND MONONUCLEAR NO SQUAMOUS EPITHELIAL CELLS SEEN MODERATE GRAM POSITIVE COCCI IN PAIRS RARE GRAM NEGATIVE RODS RARE GRAM POSITIVE COCCI    Culture   Final    MODERATE STREPTOCOCCUS PNEUMONIAE Performed at [facility]    Report Status 04/03/2015 FINAL  Final   Organism ID, Bacteria STREPTOCOCCUS PNEUMONIAE  Final      Susceptibility   Streptococcus pneumoniae - MIC (ETEST)*    CEFTRIAXONE 0.25 SENSITIVE Sensitive     LEVOFLOXACIN 0.75 SENSITIVE Sensitive     PENICILLIN 1.0 INTERMEDIATE Intermediate     * MODERATE STREPTOCOCCUS PNEUMONIAE  Culture, Urine     Status: None   Collection Time: 03/31/15 10:45 AM  Result Value Ref Range Status   Specimen Description URINE, CATHETERIZED  Final   Special Requests NONE  Final   Culture NO GROWTH 1 DAY  Final   Report Status 04/01/2015 FINAL  Final  Culture, blood (routine x 2)     Status: None   Collection Time: 03/31/15 10:55 AM  Result Value Ref Range Status   Specimen Description BLOOD LEFT THUMB  Final   Special Requests IN PEDIATRIC BOTTLE 1CC  Final   Culture NO GROWTH 5 DAYS  Final   Report Status 04/05/2015 FINAL  Final  Culture, respiratory (NON-Expectorated)     Status: None   Collection Time: 04/01/15  8:56 AM  Result Value Ref Range Status   Specimen Description TRACHEAL ASPIRATE  Final   Special Requests NONE  Final   Gram Stain   Final    ABUNDANT WBC PRESENT,BOTH PMN AND MONONUCLEAR NO SQUAMOUS EPITHELIAL CELLS SEEN ABUNDANT GRAM POSITIVE COCCI IN PAIRS Performed at [facility]    Culture   Final    NO GROWTH 2 DAYS Performed at [facility]    Report Status 04/03/2015 FINAL  Final      Studies/Results: Mr Brain Wo Contrast  04/06/2015   CLINICAL DATA:  Encephalopathy.  Altered  mental status.  Delirium.  EXAM: MRI HEAD WITHOUT CONTRAST  TECHNIQUE: Multiplanar, multiecho pulse sequences of the brain and surrounding structures were obtained without intravenous contrast.  COMPARISON:  None.  FINDINGS: Moderate generalized atrophy and white matter disease is present bilaterally.  Dilated perivascular spaces are present in the basal ganglia. Mild white matter changes extend into the brainstem. The cerebellum is unremarkable. The internal auditory canals are unremarkable. Flow is present in the major intracranial arteries.  The globes and orbits are intact. A small polyp or mucous retention cyst is noted along the medial aspect of the left maxillary sinus. The paranasal sinuses are otherwise clear. There is fluid in the right mastoid air cells. No obstructing nasopharyngeal lesion is present. Skullbase is within normal limits. Midline structures are unremarkable.  IMPRESSION: 1. No acute intracranial abnormality or change to account for acute encephalopathy. 2. Moderate generalized atrophy and white matter disease likely reflects the sequela of chronic microvascular ischemia.   Electronically Signed   By: San Morelle M.D.   On: 04/06/2015 09:56   Dg Abd Portable 1v  04/07/2015   CLINICAL DATA:  NG tube placement.  EXAM: PORTABLE ABDOMEN - 1 VIEW  COMPARISON:  Yesterday at 1900 hour, multiple prior exams.  FINDINGS: Tip of the weighted enteric tube has been advanced and now lies in the region of the distal stomach. Air-filled small bowel loops again seen in the central abdomen, with a generalized pattern of decreased distension.  IMPRESSION: Tip of the weighted enteric tube in the region of the distal stomach.   Electronically Signed   By: Jeb Levering M.D.   On: 04/07/2015 04:58   Dg Abd Portable 1v  04/06/2015   CLINICAL DATA:  75 year old male with enteric tube placement. Evaluate for tube position.  EXAM: PORTABLE ABDOMEN - 1 VIEW  COMPARISON:  Radiograph dated 04/04/2015   FINDINGS: An enteric tube is seen with tip in the epigastric area, likely within the stomach. Multiple dilated air-filled loops of small bowel again noted. There has been interval decrease in the air-filled loops of small bowel compared to the prior study. Right upper quadrant cholecystectomy clips noted. Midline vertical cutaneous surgical clips seen.  IMPRESSION: Enteric tube in the epigastric area likely within the stomach.   Electronically Signed   By: Anner Crete M.D.   On: 04/06/2015 19:17   Dg Swallowing Func-speech Pathology  04/05/2015    Objective Swallowing Evaluation:    Patient Details  Name: Jesse [name] MRN: [id number] Date of Birth: 1940-11-09  Today's Date: 04/05/2015 Time: SLP Start Time (ACUTE ONLY): 1555-SLP Stop Time (ACUTE ONLY): 1615 SLP Time Calculation (min) (ACUTE ONLY): 20 min  Past Medical History:  Past Medical History  Diagnosis Date  . Memory loss   . Hypertension   . Diabetes mellitus without complication   . Diverticulosis     Of colon  . Hepatitis C     Virus has never been treated. No history of cirrhosis.  . Prostate cancer ~ 1990s.    Treated with radiation seed implant.  . Hiatal hernia     Does not require meds for reflux symptoms.   Past Surgical History:  Past Surgical History  Procedure Laterality Date  . Laparoscopic cholecystectomy  ~ 2003  . Insertion prostate radiation seed  1990s  . Esophagogastroduodenoscopy N/A 03/29/2015    Procedure: ESOPHAGOGASTRODUODENOSCOPY (EGD);  Surgeon: Jerene Bears, MD;   [facility] ENDOSCOPY;  Service: Endoscopy;  Laterality: N/A;  . Colectomy N/A 03/29/2015    Procedure: EXPLORATORY LAP WITH TOTAL COLECTOMY;  Surgeon: Ralene Ok, MD;  Location: [place];  Service: General;  Laterality: N/A;  . Cystoscopy N/A 03/29/2015    Procedure: CYSTOSCOPY WITH PLACEMENT OF [place].;   Surgeon: Anne Hahn  Rosendo Gros, MD;  Location: [place];  Service: General;   Laterality: N/A;   HPI:  Other Pertinent Information: Pt is a 74 yo male with a  PMH of dementia,  type 2 DM treated with oral agents, HTN. Hepatitis C positive,  diverticulosis on colonoscopy, prostate cancer. Pt was admitted on 03/27/15  when the pt was found unresponsive and in shock (hemorhagic shock). MD  suspected lower GIB likely diverticular, with hemmorhagic shock, pt had an  emergent subtotal colectomy. CXR shows improving lower right lobe  infiltrate. Pt was intubated 03/29/15 - 03/30/15, 03/30/15-04/02/15, currently  NPO.   No Data Recorded  Assessment / Plan / Recommendation CHL IP CLINICAL IMPRESSIONS 04/05/2015  Therapy Diagnosis Mild oral phase dysphagia;Moderate pharyngeal phase  dysphagia;Severe pharyngeal phase dysphagia  Clinical Impression MBSS was completed.  The patient presented with mild  oral and mod-severe pharyngeal dysphagia characterized by motor sensotry  deficits.  The patient demonstrated delayed oral transit, poor bolus  cohesion with premature spill of material with some oral residuals,  delayed swallow trigger, decreased epiglottic inversion, poor airway  closure and decreased constriction with vallecular and pyriform residue.   Silent penetration of all textures was noted during the swallow.   Compensatory strategies were not attempted due to the patient's confusion.   Safest diet is NPO.  ST will follow for trial swallowing therapy which  will be dependent upon the patient's ability to follow commands.        CHL IP TREATMENT RECOMMENDATION 04/05/2015  Treatment Recommendations Therapy as outlined in treatment plan below     CHL IP DIET RECOMMENDATION 04/05/2015  SLP Diet Recommendations NPO  Liquid Administration via (None)  Medication Administration Via alternative means  Compensations (None)  Postural Changes and/or Swallow Maneuvers (None)     CHL IP OTHER RECOMMENDATIONS 04/05/2015  Recommended Consults (None)  Oral Care Recommendations Oral care QID  Other Recommendations (None)     No flowsheet data found.   CHL IP FREQUENCY AND DURATION 04/05/2015  Speech Therapy  Frequency (ACUTE ONLY) min 2x/week  Treatment Duration 2 weeks     Pertinent Vitals/Pain No active distress.                        Lamar Sprinkles 04/05/2015, 4:22 PM  Shelly Flatten, MA, CCC-SLP Acute Rehab SLP [phone]      Medications:  Scheduled: . ampicillin-sulbactam (UNASYN) IV  1.5 g Intravenous Q6H  . antiseptic oral rinse  7 mL Mouth Rinse q12n4p  . chlorhexidine  15 mL Mouth Rinse BID  . enoxaparin (LOVENOX) injection  40 mg Subcutaneous Q24H  . feeding supplement (VITAL HIGH PROTEIN)  1,000 mL Per Tube Q24H  . Influenza vac split quadrivalent PF  0.5 mL Intramuscular Tomorrow-1000  . insulin aspart  0-9 Units Subcutaneous 6 times per day  . pantoprazole (PROTONIX) IV  40 mg Intravenous Q24H  . potassium chloride  10 mEq Intravenous Q1 Hr x 4  . sodium chloride  3 mL Intravenous Q12H  . THROMBI-PAD  1 each Topical Once   Continuous: . sodium chloride 0.45 % 1,000 mL with potassium chloride 20 mEq infusion 75 mL/hr at 04/06/15 2243   YHC:[id number] **OR** acetaminophen, fentaNYL (SUBLIMAZE) injection, hydrALAZINE, ondansetron **OR** ondansetron (ZOFRAN) IV  Assessment/Plan:  Principal Problem:   Acute GI bleeding Active Problems:   HTN (hypertension)   Diabetes mellitus type 2, controlled   Dementia   Hiatal hernia   History of diverticulitis   History of  prostate cancer   Acute blood loss anemia   Acute renal failure   GI bleeding   Hematochezia   Acute respiratory failure with hypoxia   History of diverticulosis   Aspiration pneumonia    Aspiration pneumonia Patient was initially on vancomycin and Zosyn. He was transitioned to ceftriaxone on 9/8. There was increase in white count to 12 on 9/9 when he was changed back to vancomycin and Zosyn. WBC is improved. He remains afebrile. He is completed 7 days of treatment for strep pneumoniae identified on sputum culture. Patient was transitioned to Unasyn.   Acute massive diverticular bleed, status post total  colectomy General surgery is following. Pain medications as needed.  Dysphagia Seen by speech therapy. Patient underwent modified barium swallow 9/10. His dysphagia appears to be oropharyngeal. He is noted to have a history of dementia, which could be contributing. Does not have any focal neurological deficits. Proceed with nasal feeding tube for now. Discussed with his wife regarding long-term plan. It may be reasonable to try nasal feeding to see if patient's general condition improves. But in all likelihood his dysphagia will persist. Patient's wife was taken aback by this opinion. She was explained regarding the diagnosis. She was told about the findings on MRI. She was explained the natural progression of somebody with dementia. She will think about a PEG tube in this patient. This will be challenging because patient appears to pull on his tubes. We will continue this conversation tomorrow.  Acute encephalopathy This is in the setting of baseline dementia. His mental status appears to be at baseline per his wife. Patient's dementia was initially diagnosed about 4-5 years ago. It appears to have progressed. Decline could have been rapid these last few weeks due to acute illness. His TSH, B-12 level was normal. RPR and HIV is nonreactive. MRI brain shows chronic microvascular ischemia. No acute processes identified. Atrophy is noted.  Hypokalemia Will be repleted intravenously. Mg will be checked.  Acute blood loss anemia Secondary to GI bleeding. Patient was transfused PRBC. Hemoglobin is stable  History of type 2 diabetes Continue monitoring CBGs. Continue sliding scale coverage.  History of essential hypertension Blood pressures noted to be elevated at times. Hydralazine as needed   DVT Prophylaxis: Lovenox    Code Status: Full code  Family Communication: No family at bedside  Disposition Plan: Remain in step down for now. Wife to decide on PEG tube placement.     LOS: 11 days    [facility] Pager [phone] 04/07/2015, 7:41 AM  If 7PM-7AM, please contact night-coverage at [email or web address], password [facility]

## 2015-04-07 NOTE — Progress Notes (Signed)
9 Days Post-Op  Subjective: Pt had DHT replaced this AM. Tol TFs  Objective: Vital signs in last 24 hours: Temp:  [97.9 F (36.6 C)-99.3 F (37.4 C)] 97.9 F (36.6 C) (09/12 0808) Pulse Rate:  [95-102] 96 (09/12 0808) Resp:  [15-26] 26 (09/12 0808) BP: (123-164)/(83-95) 159/89 mmHg (09/12 0808) SpO2:  [97 %-100 %] 100 % (09/12 0808) Weight:  [92.7 kg (204 lb 5.9 oz)] 92.7 kg (204 lb 5.9 oz) (09/12 0400) Last BM Date: 04/07/15  Intake/Output from previous day: 09/11 0701 - 09/12 0700 In: 2096.8 [I.V.:1526.8; NG/GT:20; IV Piggyback:550] Out: 1725 [Urine:1525; Stool:200] Intake/Output this shift: Total I/O In: 10 [NG/GT:10] Out: -   General appearance: alert and cooperative GI: soft, nttp, ND, wound c/d/i, ostomy pink/patent  Lab Results:   Recent Labs  04/06/15 0530 04/07/15 0525  WBC 10.8* 10.0  HGB 10.0* 9.8*  HCT 30.8* 31.4*  PLT 421* 437*   BMET  Recent Labs  04/06/15 0530 04/07/15 0525  NA 147* 144  K 3.0* 3.2*  CL 106 107  CO2 32 28  GLUCOSE 116* 108*  BUN 8 9  CREATININE 1.34* 1.26*  CALCIUM 8.6* 8.6*   PT/INR No results for input(s): LABPROT, INR in the last 72 hours. ABG No results for input(s): PHART, HCO3 in the last 72 hours.  Invalid input(s): PCO2, PO2  Studies/Results: Mr Brain Wo Contrast  04/06/2015   CLINICAL DATA:  Encephalopathy.  Altered mental status.  Delirium.  EXAM: MRI HEAD WITHOUT CONTRAST  TECHNIQUE: Multiplanar, multiecho pulse sequences of the brain and surrounding structures were obtained without intravenous contrast.  COMPARISON:  None.  FINDINGS: Moderate generalized atrophy and white matter disease is present bilaterally. Dilated perivascular spaces are present in the basal ganglia. Mild white matter changes extend into the brainstem. The cerebellum is unremarkable. The internal auditory canals are unremarkable. Flow is present in the major intracranial arteries.  The globes and orbits are intact. A small polyp or mucous  retention cyst is noted along the medial aspect of the left maxillary sinus. The paranasal sinuses are otherwise clear. There is fluid in the right mastoid air cells. No obstructing nasopharyngeal lesion is present. Skullbase is within normal limits. Midline structures are unremarkable.  IMPRESSION: 1. No acute intracranial abnormality or change to account for acute encephalopathy. 2. Moderate generalized atrophy and white matter disease likely reflects the sequela of chronic microvascular ischemia.   Electronically Signed   By: San Morelle M.D.   On: 04/06/2015 09:56   Dg Abd Portable 1v  04/07/2015   CLINICAL DATA:  NG tube placement.  EXAM: PORTABLE ABDOMEN - 1 VIEW  COMPARISON:  Yesterday at 1900 hour, multiple prior exams.  FINDINGS: Tip of the weighted enteric tube has been advanced and now lies in the region of the distal stomach. Air-filled small bowel loops again seen in the central abdomen, with a generalized pattern of decreased distension.  IMPRESSION: Tip of the weighted enteric tube in the region of the distal stomach.   Electronically Signed   By: Jeb Levering M.D.   On: 04/07/2015 04:58   Dg Abd Portable 1v  04/06/2015   CLINICAL DATA:  74 year old male with enteric tube placement. Evaluate for tube position.  EXAM: PORTABLE ABDOMEN - 1 VIEW  COMPARISON:  Radiograph dated 04/04/2015  FINDINGS: An enteric tube is seen with tip in the epigastric area, likely within the stomach. Multiple dilated air-filled loops of small bowel again noted. There has been interval decrease in the air-filled loops  of small bowel compared to the prior study. Right upper quadrant cholecystectomy clips noted. Midline vertical cutaneous surgical clips seen.  IMPRESSION: Enteric tube in the epigastric area likely within the stomach.   Electronically Signed   By: Anner Crete M.D.   On: 04/06/2015 19:17   Dg Swallowing Func-speech Pathology  04/05/2015    Objective Swallowing Evaluation:    Patient  Details  Name: Jesse [name] MRN: [id number] Date of Birth: 29-Oct-1940  Today's Date: 04/05/2015 Time: SLP Start Time (ACUTE ONLY): 1555-SLP Stop Time (ACUTE ONLY): 1615 SLP Time Calculation (min) (ACUTE ONLY): 20 min  Past Medical History:  Past Medical History  Diagnosis Date  . Memory loss   . Hypertension   . Diabetes mellitus without complication   . Diverticulosis     Of colon  . Hepatitis C     Virus has never been treated. No history of cirrhosis.  . Prostate cancer ~ 1990s.    Treated with radiation seed implant.  . Hiatal hernia     Does not require meds for reflux symptoms.   Past Surgical History:  Past Surgical History  Procedure Laterality Date  . Laparoscopic cholecystectomy  ~ 2003  . Insertion prostate radiation seed  1990s  . Esophagogastroduodenoscopy N/A 03/29/2015    Procedure: ESOPHAGOGASTRODUODENOSCOPY (EGD);  Surgeon: Jerene Bears, MD;   Location: [facility] ENDOSCOPY;  Service: Endoscopy;  Laterality: N/A;  . Colectomy N/A 03/29/2015    Procedure: EXPLORATORY LAP WITH TOTAL COLECTOMY;  Surgeon: Ralene Ok, MD;  Location: [place];  Service: General;  Laterality: N/A;  . Cystoscopy N/A 03/29/2015    Procedure: CYSTOSCOPY WITH PLACEMENT OF [address]   Surgeon: Ralene Ok, MD;  Location: [place];  Service: General;   Laterality: N/A;   HPI:  Other Pertinent Information: Pt is a 74 yo male with a PMH of dementia,  type 2 DM treated with oral agents, HTN. Hepatitis C positive,  diverticulosis on colonoscopy, prostate cancer. Pt was admitted on 03/27/15  when the pt was found unresponsive and in shock (hemorhagic shock). MD  suspected lower GIB likely diverticular, with hemmorhagic shock, pt had an  emergent subtotal colectomy. CXR shows improving lower right lobe  infiltrate. Pt was intubated 03/29/15 - 03/30/15, 03/30/15-04/02/15, currently  NPO.   No Data Recorded  Assessment / Plan / Recommendation CHL IP CLINICAL IMPRESSIONS 04/05/2015  Therapy Diagnosis Mild oral phase dysphagia;Moderate  pharyngeal phase  dysphagia;Severe pharyngeal phase dysphagia  Clinical Impression MBSS was completed.  The patient presented with mild  oral and mod-severe pharyngeal dysphagia characterized by motor sensotry  deficits.  The patient demonstrated delayed oral transit, poor bolus  cohesion with premature spill of material with some oral residuals,  delayed swallow trigger, decreased epiglottic inversion, poor airway  closure and decreased constriction with vallecular and pyriform residue.   Silent penetration of all textures was noted during the swallow.   Compensatory strategies were not attempted due to the patient's confusion.   Safest diet is NPO.  ST will follow for trial swallowing therapy which  will be dependent upon the patient's ability to follow commands.        CHL IP TREATMENT RECOMMENDATION 04/05/2015  Treatment Recommendations Therapy as outlined in treatment plan below     CHL IP DIET RECOMMENDATION 04/05/2015  SLP Diet Recommendations NPO  Liquid Administration via (None)  Medication Administration Via alternative means  Compensations (None)  Postural Changes and/or Swallow Maneuvers (None)     CHL IP OTHER  RECOMMENDATIONS 04/05/2015  Recommended Consults (None)  Oral Care Recommendations Oral care QID  Other Recommendations (None)     No flowsheet data found.   CHL IP FREQUENCY AND DURATION 04/05/2015  Speech Therapy Frequency (ACUTE ONLY) min 2x/week  Treatment Duration 2 weeks     Pertinent Vitals/Pain No active distress.                        Lamar Sprinkles 04/05/2015, 4:22 PM  Shelly Flatten, MA, CCC-SLP Acute Rehab SLP [phone]      Anti-infectives: Anti-infectives    Start     Dose/Rate Route Frequency Ordered Stop   04/06/15 1200  ampicillin-sulbactam (UNASYN) 1.5 g in sodium chloride 0.9 % 50 mL IVPB     1.5 g 100 mL/hr over 30 Minutes Intravenous Every 6 hours 04/06/15 1102     04/04/15 1200  piperacillin-tazobactam (ZOSYN) IVPB 3.375 g  Status:  Discontinued     3.375 g 12.5  mL/hr over 240 Minutes Intravenous Every 8 hours 04/04/15 1019 04/06/15 0809   04/04/15 1100  vancomycin (VANCOCIN) 1,250 mg in sodium chloride 0.9 % 250 mL IVPB  Status:  Discontinued     1,250 mg 166.7 mL/hr over 90 Minutes Intravenous Every 12 hours 04/04/15 1019 04/06/15 0809   04/02/15 1200  cefTRIAXone (ROCEPHIN) 2 g in dextrose 5 % 50 mL IVPB  Status:  Discontinued     2 g 100 mL/hr over 30 Minutes Intravenous Every 24 hours 04/02/15 0955 04/04/15 0922   03/31/15 2200  vancomycin (VANCOCIN) IVPB 750 mg/150 ml premix  Status:  Discontinued     750 mg 150 mL/hr over 60 Minutes Intravenous Every 12 hours 03/31/15 0929 04/01/15 0839   03/31/15 1000  vancomycin (VANCOCIN) 2,000 mg in sodium chloride 0.9 % 500 mL IVPB     2,000 mg 250 mL/hr over 120 Minutes Intravenous  Once 03/31/15 0927 03/31/15 1237   03/31/15 1000  piperacillin-tazobactam (ZOSYN) IVPB 3.375 g  Status:  Discontinued     3.375 g 12.5 mL/hr over 240 Minutes Intravenous Every 8 hours 03/31/15 0927 04/02/15 0955   03/29/15 1000  cefoTEtan (CEFOTAN) 2 g in dextrose 5 % 50 mL IVPB     2 g 100 mL/hr over 30 Minutes Intravenous To Surgery 03/29/15 0957 03/29/15 1012      Assessment/Plan: s/p Procedure(s): EXPLORATORY LAP WITH TOTAL COLECTOMY (N/A) CYSTOSCOPY WITH PLACEMENT OF 18 FRENCH COUDE CATHETER. (N/A) Con't TFs as tol mobilze as possible   LOS: 11 days    Rosario Jacks., Anne Hahn 04/07/2015

## 2015-04-07 NOTE — Progress Notes (Signed)
Nutrition Follow-up  DOCUMENTATION CODES:   Obesity unspecified  INTERVENTION:    Continue TF via NGT, change to Vital AF 1.2, increase by 10 ml every 8 hours to goal rate of 80 ml/h to provide 2304 kcals, 144 gm protein, 1557 ml free water daily.  NUTRITION DIAGNOSIS:   Inadequate oral intake related to altered GI function as evidenced by NPO status.  Ongoing  GOAL:   Patient will meet greater than or equal to 90% of their needs  Progressing  MONITOR:   TF tolerance, Weight trends, Labs  REASON FOR ASSESSMENT:   Consult Enteral/tube feeding initiation and management  ASSESSMENT:   Patient admitted on 9/1 with acute lower GI bleed with hemorrhagic shock.  S/P subtotal abdominal colectomy for massive LGI bleed on 9/3. Now with RLQ end ileostomy. 200+ ml output 9/11. Trickle TF was attempted last week, but patient did not tolerate (500 ml residuals). TF was resumed on 9/11. Vital High Protein currently running at 10 ml/h providing 240 kcals, 21 gm protein, 201 ml free water daily. No residuals per RN.  Per discussion with RN, patient has pulled NGT a few times. Possible plan for PEG soon. Wife not quite sure that she wants him to have a PEG and wants to assess his swallowing function before proceeding with PEG. S/P swallow evaluation with SLP 9/10, reccommended to remain NPO. Now with smaller NGT and wife thinks he can swallow now.   Diet Order:  Diet NPO time specified  Skin:  Reviewed, no issues  Last BM:  200+ ml output via ileostomy on 9/11  Height:   Ht Readings from Last 1 Encounters:  04/07/15 5\' 11"  (1.803 m)    Weight:   Wt Readings from Last 1 Encounters:  04/07/15 204 lb 5.9 oz (92.7 kg)    Ideal Body Weight:  78.18 kg  BMI:  Body mass index is 28.52 kg/(m^2).  Estimated Nutritional Needs:   Kcal:  2200-2400  Protein:  120-140 gm  Fluid:  2.2-2.4 L  EDUCATION NEEDS:   No education needs identified at this time  Molli Barrows, [place],  [place], [place] Pager [phone] After Hours Pager [phone]

## 2015-04-07 NOTE — Progress Notes (Signed)
Pateint pulled out NGT MD notified orders placed.

## 2015-04-07 NOTE — Progress Notes (Signed)
Replaced Panda placement ausculted portable xray pending to confirm placement.

## 2015-04-08 DIAGNOSIS — E876 Hypokalemia: Secondary | ICD-10-CM

## 2015-04-08 LAB — CBC
HEMATOCRIT: 33.8 % — AB (ref 39.0–52.0)
Hemoglobin: 10.7 g/dL — ABNORMAL LOW (ref 13.0–17.0)
MCH: 29.8 pg (ref 26.0–34.0)
MCHC: 31.7 g/dL (ref 30.0–36.0)
MCV: 94.2 fL (ref 78.0–100.0)
PLATELETS: 473 10*3/uL — AB (ref 150–400)
RBC: 3.59 MIL/uL — AB (ref 4.22–5.81)
RDW: 15.9 % — ABNORMAL HIGH (ref 11.5–15.5)
WBC: 10.5 10*3/uL (ref 4.0–10.5)

## 2015-04-08 LAB — BASIC METABOLIC PANEL
ANION GAP: 8 (ref 5–15)
BUN: 9 mg/dL (ref 6–20)
CHLORIDE: 108 mmol/L (ref 101–111)
CO2: 26 mmol/L (ref 22–32)
Calcium: 8.8 mg/dL — ABNORMAL LOW (ref 8.9–10.3)
Creatinine, Ser: 1.11 mg/dL (ref 0.61–1.24)
GFR calc non Af Amer: >60 mL/min (ref >60)
Glucose, Bld: 131 mg/dL — ABNORMAL HIGH (ref 65–99)
POTASSIUM: 3.4 mmol/L — AB (ref 3.5–5.1)
SODIUM: 142 mmol/L (ref 135–145)

## 2015-04-08 LAB — GLUCOSE, CAPILLARY
GLUCOSE-CAPILLARY: 134 mg/dL — AB (ref 65–99)
GLUCOSE-CAPILLARY: 115 mg/dL — AB (ref 65–99)
GLUCOSE-CAPILLARY: 122 mg/dL — AB (ref 65–99)
Glucose-Capillary: 117 mg/dL — ABNORMAL HIGH (ref 65–99)
Glucose-Capillary: 141 mg/dL — ABNORMAL HIGH (ref 65–99)
Glucose-Capillary: 108 mg/dL — ABNORMAL HIGH (ref 65–99)

## 2015-04-08 LAB — MAGNESIUM: Magnesium: 2.1 mg/dL (ref 1.7–2.4)

## 2015-04-08 MED ORDER — RISPERIDONE 0.5 MG PO TBDP
0.5000 mg | ORAL_TABLET | Freq: Every day | ORAL | Status: DC
  Filled 2015-04-08: qty 1

## 2015-04-08 MED ORDER — POTASSIUM CHLORIDE 10 MEQ/100ML IV SOLN
10.0000 meq | INTRAVENOUS | Status: AC
  Administered 2015-04-08 (×4): 10 meq via INTRAVENOUS
  Filled 2015-04-08: qty 100

## 2015-04-08 MED ORDER — VALPROATE SODIUM 500 MG/5ML IV SOLN
250.0000 mg | Freq: Two times a day (BID) | INTRAVENOUS | Status: DC
  Administered 2015-04-08 – 2015-04-11 (×7): 250 mg via INTRAVENOUS
  Filled 2015-04-08 (×8): qty 2.5

## 2015-04-08 NOTE — Consult Note (Signed)
WOC ostomy follow up Stoma type/location: RLQ, end ileostomy  Stomal assessment/size: budded, pink, moist 1 1/4" round, os at 9 o'clock  Peristomal assessment: intact  Treatment options for stomal/peristomal skin: using 2" skin barrier ring to protect skin from ileostomy output and to aid in seal Output liquid, brown/yellos Ostomy pouching: 2pc. 2 1/4" placed with 2" barrier ring Education provided: pt confused, not teachable at this point.  No family in the room.  Plans for SNF at [place] patient in [facility] Discharge program: No  WOC will follow along with you for continued support with ostomy teaching and care [facility] RN,CWOCN [phone]

## 2015-04-08 NOTE — Progress Notes (Signed)
[facility] PROGRESS NOTE  Jesse [name] ZOX:[id number] DOB: Aug 03, 1940 DOA: 03/27/2015  PCP: No primary care provider on file.  Brief HPI: 74 year old African-American male with a past medical history of dementia, type 2 diabetes, essential hypertension, hepatitis C, and a known history of diverticulosis, presented with massive GI bleeding. This required emergent total colectomy. During the course of this hospitalization, patient has remained encephalopathic. He also was noted to have severe dysphagia. He is requiring tube feedings.  Past medical history:  Past Medical History  Diagnosis Date  . Memory loss   . Hypertension   . Diabetes mellitus without complication   . Diverticulosis     Of colon  . Hepatitis C     Virus has never been treated. No history of cirrhosis.  . Prostate cancer ~ 1990s.    Treated with radiation seed implant.  . Hiatal hernia     Does not require meds for reflux symptoms.    Consultants: Gen. surgery. Palliative medicine consulted today.  Procedures:   Emergent exploratory laparotomy with total colectomy on 9/3.  EGD 9/3 which was normal with no source of GI bleeding  Cystoscopy and insertion of Foley catheter and bladder irrigation 9/3  Antibiotics: Had been on vancomycin and Zosyn. Has completed treatment for strep pneumoniae. Now being treated for aspiration pneumonia.  Unasyn initiated 9/11  Subjective: Patient remains confused. Slightly agitated at times.   Objective: Vital Signs  Filed Vitals:   04/08/15 0355 04/08/15 0400 04/08/15 0415 04/08/15 0710  BP: 158/92  149/93 151/89  Pulse: 100  100 99  Temp:  98.1 F (36.7 C)    TempSrc:  Oral    Resp: 18  20 14   Height:      Weight:  92.4 kg (203 lb 11.3 oz)    SpO2: 100%  98% 98%    Intake/Output Summary (Last 24 hours) at 04/08/15 0731 Last data filed at 04/08/15 0600  Gross per 24 hour  Intake 2857.49 ml  Output   2200 ml  Net 657.49 ml   Filed Weights   04/06/15  0500 04/07/15 0400 04/08/15 0400  Weight: 95.5 kg (210 lb 8.6 oz) 92.7 kg (204 lb 5.9 oz) 92.4 kg (203 lb 11.3 oz)    General appearance: alert, distracted and no distress Resp: Diminished air entry at the bases. No crackles or wheezing. Cardio: regular rate and rhythm, S1, S2 normal, no murmur, click, rub or gallop GI: Covered with dressing. Tender to palpation. Bowel sounds sluggish. Neurologic: Alert. Distracted. Disoriented to time, place, person. No facial asymmetry. Able to move all his extremities without difficulties. No focal deficits identified.  Lab Results:  Basic Metabolic Panel:  Recent Labs Lab 04/04/15 0430 04/05/15 0510 04/05/15 0812 04/06/15 0530 04/07/15 0525 04/08/15 0530  NA 144 145  --  147* 144 142  K 3.7 3.0*  --  3.0* 3.2* 3.4*  CL 105 104  --  106 107 108  CO2 26 31  --  32 28 26  GLUCOSE 123* 121*  --  116* 108* 131*  BUN 6 7  --  8 9 9   CREATININE 1.07 1.20  --  1.34* 1.26* 1.11  CALCIUM 7.8* 8.5*  --  8.6* 8.6* 8.8*  MG  --   --  2.1  --   --  2.1   Liver Function Tests:  Recent Labs Lab 04/02/15 0455 04/05/15 0510 04/06/15 0530  AST 34 39 38  ALT 24 46 46  ALKPHOS 35* 58 54  BILITOT 0.4 1.0 1.1  PROT 5.4* 5.5* 5.7*  ALBUMIN 1.5* 2.0* 2.0*   CBC:  Recent Labs Lab 04/02/15 0455 04/03/15 0317  04/04/15 0430 04/05/15 0812 04/06/15 0530 04/07/15 0525 04/08/15 0530  WBC 8.4 7.2  < > 12.3* 12.1* 10.8* 10.0 10.5  NEUTROABS 6.0 5.0  --  10.0*  --   --   --   --   HGB 7.0* 6.7*  < > 10.2* 10.2* 10.0* 9.8* 10.7*  HCT 21.9* 20.5*  < > 30.8* 31.5* 30.8* 31.4* 33.8*  MCV 90.5 90.2  < > 90.3 92.4 94.2 94.3 94.2  PLT 160 212  < > 344 407* 421* 437* 473*  < > = values in this interval not displayed.  CBG:  Recent Labs Lab 04/07/15 1156 04/07/15 1648 04/07/15 1957 04/07/15 2346 04/08/15 0410  GLUCAP 104* 119* 115* 111* 117*    Recent Results (from the past 240 hour(s))  Culture, respiratory (NON-Expectorated)     Status: None     Collection Time: 03/30/15  4:08 PM  Result Value Ref Range Status   Specimen Description TRACHEAL ASPIRATE  Final   Special Requests Normal  Final   Gram Stain   Final    ABUNDANT WBC PRESENT,BOTH PMN AND MONONUCLEAR NO SQUAMOUS EPITHELIAL CELLS SEEN MODERATE GRAM POSITIVE COCCI IN PAIRS RARE GRAM NEGATIVE RODS RARE GRAM POSITIVE COCCI    Culture   Final    MODERATE STREPTOCOCCUS PNEUMONIAE Performed at [facility]    Report Status 04/03/2015 FINAL  Final   Organism ID, Bacteria STREPTOCOCCUS PNEUMONIAE  Final      Susceptibility   Streptococcus pneumoniae - MIC (ETEST)*    CEFTRIAXONE 0.25 SENSITIVE Sensitive     LEVOFLOXACIN 0.75 SENSITIVE Sensitive     PENICILLIN 1.0 INTERMEDIATE Intermediate     * MODERATE STREPTOCOCCUS PNEUMONIAE  Culture, Urine     Status: None   Collection Time: 03/31/15 10:45 AM  Result Value Ref Range Status   Specimen Description URINE, CATHETERIZED  Final   Special Requests NONE  Final   Culture NO GROWTH 1 DAY  Final   Report Status 04/01/2015 FINAL  Final  Culture, blood (routine x 2)     Status: None   Collection Time: 03/31/15 10:55 AM  Result Value Ref Range Status   Specimen Description BLOOD LEFT THUMB  Final   Special Requests IN PEDIATRIC BOTTLE 1CC  Final   Culture NO GROWTH 5 DAYS  Final   Report Status 04/05/2015 FINAL  Final  Culture, respiratory (NON-Expectorated)     Status: None   Collection Time: 04/01/15  8:56 AM  Result Value Ref Range Status   Specimen Description TRACHEAL ASPIRATE  Final   Special Requests NONE  Final   Gram Stain   Final    ABUNDANT WBC PRESENT,BOTH PMN AND MONONUCLEAR NO SQUAMOUS EPITHELIAL CELLS SEEN ABUNDANT GRAM POSITIVE COCCI IN PAIRS Performed at [facility]    Culture   Final    NO GROWTH 2 DAYS Performed at [facility]    Report Status 04/03/2015 FINAL  Final      Studies/Results: Mr Brain Wo Contrast  04/06/2015   CLINICAL DATA:  Encephalopathy.  Altered  mental status.  Delirium.  EXAM: MRI HEAD WITHOUT CONTRAST  TECHNIQUE: Multiplanar, multiecho pulse sequences of the brain and surrounding structures were obtained without intravenous contrast.  COMPARISON:  None.  FINDINGS: Moderate generalized atrophy and white matter disease is present bilaterally. Dilated perivascular spaces are present in the  basal ganglia. Mild white matter changes extend into the brainstem. The cerebellum is unremarkable. The internal auditory canals are unremarkable. Flow is present in the major intracranial arteries.  The globes and orbits are intact. A small polyp or mucous retention cyst is noted along the medial aspect of the left maxillary sinus. The paranasal sinuses are otherwise clear. There is fluid in the right mastoid air cells. No obstructing nasopharyngeal lesion is present. Skullbase is within normal limits. Midline structures are unremarkable.  IMPRESSION: 1. No acute intracranial abnormality or change to account for acute encephalopathy. 2. Moderate generalized atrophy and white matter disease likely reflects the sequela of chronic microvascular ischemia.   Electronically Signed   By: San Morelle M.D.   On: 04/06/2015 09:56   Dg Abd Portable 1v  04/07/2015   CLINICAL DATA:  NG tube placement.  EXAM: PORTABLE ABDOMEN - 1 VIEW  COMPARISON:  Yesterday at 1900 hour, multiple prior exams.  FINDINGS: Tip of the weighted enteric tube has been advanced and now lies in the region of the distal stomach. Air-filled small bowel loops again seen in the central abdomen, with a generalized pattern of decreased distension.  IMPRESSION: Tip of the weighted enteric tube in the region of the distal stomach.   Electronically Signed   By: Jeb Levering M.D.   On: 04/07/2015 04:58   Dg Abd Portable 1v  04/06/2015   CLINICAL DATA:  74 year old male with enteric tube placement. Evaluate for tube position.  EXAM: PORTABLE ABDOMEN - 1 VIEW  COMPARISON:  Radiograph dated 04/04/2015   FINDINGS: An enteric tube is seen with tip in the epigastric area, likely within the stomach. Multiple dilated air-filled loops of small bowel again noted. There has been interval decrease in the air-filled loops of small bowel compared to the prior study. Right upper quadrant cholecystectomy clips noted. Midline vertical cutaneous surgical clips seen.  IMPRESSION: Enteric tube in the epigastric area likely within the stomach.   Electronically Signed   By: Anner Crete M.D.   On: 04/06/2015 19:17    Medications:  Scheduled: . ampicillin-sulbactam (UNASYN) IV  1.5 g Intravenous Q6H  . antiseptic oral rinse  7 mL Mouth Rinse q12n4p  . chlorhexidine  15 mL Mouth Rinse BID  . enoxaparin (LOVENOX) injection  40 mg Subcutaneous Q24H  . Influenza vac split quadrivalent PF  0.5 mL Intramuscular Tomorrow-1000  . insulin aspart  0-9 Units Subcutaneous 6 times per day  . pantoprazole (PROTONIX) IV  40 mg Intravenous Q24H  . potassium chloride  10 mEq Intravenous Q1 Hr x 4  . risperiDONE  0.5 mg Oral Daily  . sodium chloride  3 mL Intravenous Q12H  . THROMBI-PAD  1 each Topical Once   Continuous: . feeding supplement (VITAL AF 1.2 CAL) 1,000 mL (04/08/15 0400)  . sodium chloride 0.45 % 1,000 mL with potassium chloride 20 mEq infusion 75 mL/hr at 04/08/15 0515   ZOX:[id number] **OR** acetaminophen, fentaNYL (SUBLIMAZE) injection, hydrALAZINE, ondansetron **OR** ondansetron (ZOFRAN) IV  Assessment/Plan:  Principal Problem:   Acute GI bleeding Active Problems:   HTN (hypertension)   Diabetes mellitus type 2, controlled   Dementia   Hiatal hernia   History of diverticulitis   History of prostate cancer   Acute blood loss anemia   Acute renal failure   GI bleeding   Hematochezia   Acute respiratory failure with hypoxia   History of diverticulosis   Aspiration pneumonia   Oropharyngeal dysphagia   Hypokalemia    Aspiration  pneumonia Patient was initially on vancomycin and Zosyn.  He was transitioned to ceftriaxone on 9/8. There was increase in white count to 12 on 9/9 when he was changed back to vancomycin and Zosyn. WBC is improved. He remains afebrile. He is completed 7 days of treatment for strep pneumoniae identified on sputum culture. Patient was transitioned to Unasyn.   Acute massive diverticular bleed, status post total colectomy General surgery is following. Pain medications as needed. This issue remains stable. He has a colostomy which appears to be functioning well.  Dysphagia Seen by speech therapy and is nothing by mouth. Patient underwent modified barium swallow 9/10. His dysphagia appears to be oropharyngeal. He is noted to have a history of dementia, which could be contributing. Does not have any focal neurological deficits. Continue with nasal feeding tube for now. Discussed with his wife regarding long-term plan. It may be reasonable to try nasal feeding to see if patient's general condition improves. But in all likelihood his dysphagia will persist. Patient's wife was taken aback by this opinion. She was explained regarding the diagnosis. She was told about the findings on MRI. She was explained the natural progression of somebody with dementia. We also discussed more permanent options such as PEG tube. But the concern is that patient will pull out this tube as he remains agitated. She is also agreeable to discussing with palliative medicine, regarding goals of care.   Acute encephalopathy This is in the setting of baseline dementia. His mental status appears to be at baseline per his wife. Patient's dementia was initially diagnosed about 4-5 years ago. It appears to have progressed. Decline could have been rapid these last few weeks due to acute illness. His TSH, B-12 level was normal. RPR and HIV is nonreactive. MRI brain shows chronic microvascular ischemia. No acute processes identified. Atrophy is noted. We considered initiating Risperdal to control his  agitation. However, patient's wife declines. She is agreeable to a trial of Depakote.  Hypokalemia Will be repleted intravenously. Mg is normal.  Acute blood loss anemia Secondary to GI bleeding. Patient was transfused PRBC. Hemoglobin is stable  History of type 2 diabetes Continue monitoring CBGs. Continue sliding scale coverage.  History of essential hypertension Blood pressures noted to be elevated at times. Hydralazine as needed   DVT Prophylaxis: Lovenox    Code Status: Full code  Family Communication: Discussed with his wife in detail. Disposition Plan: Remain in step down for now. Consider palliative medicine. Depakote to be initiated today to control his agitation in case family wants to proceed with PEG tube, which in a state of agitation, patient will likely pull out.     LOS: 12 days   [facility] Pager [phone] 04/08/2015, 7:31 AM  If 7PM-7AM, please contact night-coverage at [email or web address], password [facility]

## 2015-04-08 NOTE — Progress Notes (Signed)
Speech Language Pathology Treatment: Dysphagia  Patient Details Name: Jesse [name] MRN: [id number] DOB: [birth year] Today's Date: 04/08/2015 Time: 6168-3729 SLP Time Calculation (min) (ACUTE ONLY): 11 min  Assessment / Plan / Recommendation Clinical Impression  Pt continues to be easily distracted with difficulty following one-step commands, although did follow command to cough x2 throughout session with Max cues and demonstrations. His vocal quality is wet at baseline but is cleared ultimately with a spontaneous cough. Ice chip trials result in throat clearing and increase in wet vocal quality. Given likely pooling of secretions and difficulty managing ice chip trials, continue to recommend NPO at this time.   HPI Other Pertinent Information: Pt is a 74 yo male with a PMH of dementia, type 2 DM treated with oral agents, HTN. Hepatitis C positive, diverticulosis on colonoscopy, prostate cancer. Pt was admitted on 03/27/15 when the pt was found unresponsive and in shock (hemorhagic shock). MD suspected lower GIB likely diverticular, with hemmorhagic shock, pt had an emergent subtotal colectomy. CXR shows improving lower right lobe infiltrate. Pt was intubated 03/29/15 - 03/30/15, 03/30/15-04/02/15, currently NPO.    Pertinent Vitals Pain Assessment: Faces Faces Pain Scale: No hurt  SLP Plan  Continue with current plan of care    Recommendations Diet recommendations: NPO Medication Administration: Via alternative means       Oral Care Recommendations: Oral care QID Follow up Recommendations: Skilled Nursing facility;24 hour supervision/assistance Plan: Continue with current plan of care    Germain Osgood, M.A. CCC-SLP [phone]   Germain Osgood 04/08/2015, 10:53 AM

## 2015-04-08 NOTE — Progress Notes (Signed)
10 Days Post-Op  Subjective: Pt with no acute events overnight.  Con't to be confused TFs ongoing  Objective: Vital signs in last 24 hours: Temp:  [97.9 F (36.6 C)-99.7 F (37.6 C)] 98.1 F (36.7 C) (09/13 0400) Pulse Rate:  [94-117] 99 (09/13 0710) Resp:  [14-26] 14 (09/13 0710) BP: (115-176)/(79-107) 151/89 mmHg (09/13 0710) SpO2:  [93 %-100 %] 98 % (09/13 0710) Weight:  [92.4 kg (203 lb 11.3 oz)] 92.4 kg (203 lb 11.3 oz) (09/13 0400) Last BM Date: 04/07/15  Intake/Output from previous day: 09/12 0701 - 09/13 0700 In: 2857.5 [I.V.:1728; NG/GT:629.5; IV Piggyback:500] Out: 2200 [Urine:1825; Stool:375] Intake/Output this shift:    General appearance: distracted GI: soft, non-tender; bowel sounds normal; no masses,  no organomegaly and midline wound c/d/i  Lab Results:   Recent Labs  04/07/15 0525 04/08/15 0530  WBC 10.0 10.5  HGB 9.8* 10.7*  HCT 31.4* 33.8*  PLT 437* 473*   BMET  Recent Labs  04/07/15 0525 04/08/15 0530  NA 144 142  K 3.2* 3.4*  CL 107 108  CO2 28 26  GLUCOSE 108* 131*  BUN 9 9  CREATININE 1.26* 1.11  CALCIUM 8.6* 8.8*   PT/INR No results for input(s): LABPROT, INR in the last 72 hours. ABG No results for input(s): PHART, HCO3 in the last 72 hours.  Invalid input(s): PCO2, PO2  Studies/Results: Mr Brain Wo Contrast  04/06/2015   CLINICAL DATA:  Encephalopathy.  Altered mental status.  Delirium.  EXAM: MRI HEAD WITHOUT CONTRAST  TECHNIQUE: Multiplanar, multiecho pulse sequences of the brain and surrounding structures were obtained without intravenous contrast.  COMPARISON:  None.  FINDINGS: Moderate generalized atrophy and white matter disease is present bilaterally. Dilated perivascular spaces are present in the basal ganglia. Mild white matter changes extend into the brainstem. The cerebellum is unremarkable. The internal auditory canals are unremarkable. Flow is present in the major intracranial arteries.  The globes and orbits are  intact. A small polyp or mucous retention cyst is noted along the medial aspect of the left maxillary sinus. The paranasal sinuses are otherwise clear. There is fluid in the right mastoid air cells. No obstructing nasopharyngeal lesion is present. Skullbase is within normal limits. Midline structures are unremarkable.  IMPRESSION: 1. No acute intracranial abnormality or change to account for acute encephalopathy. 2. Moderate generalized atrophy and white matter disease likely reflects the sequela of chronic microvascular ischemia.   Electronically Signed   By: San Morelle M.D.   On: 04/06/2015 09:56   Dg Abd Portable 1v  04/07/2015   CLINICAL DATA:  NG tube placement.  EXAM: PORTABLE ABDOMEN - 1 VIEW  COMPARISON:  Yesterday at 1900 hour, multiple prior exams.  FINDINGS: Tip of the weighted enteric tube has been advanced and now lies in the region of the distal stomach. Air-filled small bowel loops again seen in the central abdomen, with a generalized pattern of decreased distension.  IMPRESSION: Tip of the weighted enteric tube in the region of the distal stomach.   Electronically Signed   By: Jeb Levering M.D.   On: 04/07/2015 04:58   Dg Abd Portable 1v  04/06/2015   CLINICAL DATA:  74 year old male with enteric tube placement. Evaluate for tube position.  EXAM: PORTABLE ABDOMEN - 1 VIEW  COMPARISON:  Radiograph dated 04/04/2015  FINDINGS: An enteric tube is seen with tip in the epigastric area, likely within the stomach. Multiple dilated air-filled loops of small bowel again noted. There has been interval decrease  in the air-filled loops of small bowel compared to the prior study. Right upper quadrant cholecystectomy clips noted. Midline vertical cutaneous surgical clips seen.  IMPRESSION: Enteric tube in the epigastric area likely within the stomach.   Electronically Signed   By: Anner Crete M.D.   On: 04/06/2015 19:17    Anti-infectives: Anti-infectives    Start     Dose/Rate Route  Frequency Ordered Stop   04/06/15 1200  ampicillin-sulbactam (UNASYN) 1.5 g in sodium chloride 0.9 % 50 mL IVPB     1.5 g 100 mL/hr over 30 Minutes Intravenous Every 6 hours 04/06/15 1102     04/04/15 1200  piperacillin-tazobactam (ZOSYN) IVPB 3.375 g  Status:  Discontinued     3.375 g 12.5 mL/hr over 240 Minutes Intravenous Every 8 hours 04/04/15 1019 04/06/15 0809   04/04/15 1100  vancomycin (VANCOCIN) 1,250 mg in sodium chloride 0.9 % 250 mL IVPB  Status:  Discontinued     1,250 mg 166.7 mL/hr over 90 Minutes Intravenous Every 12 hours 04/04/15 1019 04/06/15 0809   04/02/15 1200  cefTRIAXone (ROCEPHIN) 2 g in dextrose 5 % 50 mL IVPB  Status:  Discontinued     2 g 100 mL/hr over 30 Minutes Intravenous Every 24 hours 04/02/15 0955 04/04/15 0922   03/31/15 2200  vancomycin (VANCOCIN) IVPB 750 mg/150 ml premix  Status:  Discontinued     750 mg 150 mL/hr over 60 Minutes Intravenous Every 12 hours 03/31/15 0929 04/01/15 0839   03/31/15 1000  vancomycin (VANCOCIN) 2,000 mg in sodium chloride 0.9 % 500 mL IVPB     2,000 mg 250 mL/hr over 120 Minutes Intravenous  Once 03/31/15 0927 03/31/15 1237   03/31/15 1000  piperacillin-tazobactam (ZOSYN) IVPB 3.375 g  Status:  Discontinued     3.375 g 12.5 mL/hr over 240 Minutes Intravenous Every 8 hours 03/31/15 0927 04/02/15 0955   03/29/15 1000  cefoTEtan (CEFOTAN) 2 g in dextrose 5 % 50 mL IVPB     2 g 100 mL/hr over 30 Minutes Intravenous To Surgery 03/29/15 0957 03/29/15 1012      Assessment/Plan: s/p Procedure(s): EXPLORATORY LAP WITH TOTAL COLECTOMY (N/A) CYSTOSCOPY WITH PLACEMENT OF 18 FRENCH COUDE CATHETER. (N/A) Con't TFs Con't med mgmt   LOS: 12 days    Rosario Jacks., Anne Hahn 04/08/2015

## 2015-04-08 NOTE — Clinical Social Work Note (Addendum)
CSW contacted the pt's insurance [employer] ([place] at [phone]. CSW informed that the pt's case manager is Lucinda Doba. CSW left a voice message for Lucinda regarding negotiating an out of network  SNF contact in order for the pt to transition to SNF.    Addendum: CSW informed by [place] at [phone] that [place] [facility] will pay for only 50% of the pt's bill. The family will be responsible for the rest. CSW contacted the pt's wife to inform her of the pt's benefits. CSW awaiting a call back from the pt's wife.   [place], MSW, [place]

## 2015-04-08 NOTE — Progress Notes (Signed)
Physical Therapy Treatment Patient Details Name: Jesse [name] MRN: [id number] DOB: 04-Jul-1941 Today's Date: 04/08/2015    History of Present Illness Pt is a 74 y/o male with a PMH of dementia (wife reports sundowning), DMII, HTN, hep-C. Pt presents with GI bleed requiring emergent subtotal colectomy.     PT Comments    Progressing steadily.  More participative and easier to redirect.  Gait still unsteady with RW, but need to try without assistive device to see how safe he is.  Follow Up Recommendations  SNF;Other (comment) (wife may chose to take him home to [place])     Clinical biochemist with 5" wheels    Recommendations for Other Services       Precautions / Restrictions Precautions Precautions: Fall Precaution Comments: Colostomy, NG tube, Abd incision. 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 Needed 6 people to restrain him at one point.    Mobility  Bed Mobility Overal bed mobility: Needs Assistance Bed Mobility: Supine to Sit;Sit to Supine     Supine to sit: Min guard Sit to supine: Min guard   General bed mobility comments: cues for guidance and direction  Transfers Overall transfer level: Needs assistance Equipment used: Rolling walker (2 wheeled) Transfers: Sit to/from Stand Sit to Stand: Min assist;+2 safety/equipment Stand pivot transfers: Min assist;+2 safety/equipment       General transfer comment: steady assist and postural cues  Ambulation/Gait Ambulation/Gait assistance: Min assist;Min guard;+2 safety/equipment Ambulation Distance (Feet): 330 Feet Assistive device: Rolling walker (2 wheeled) Gait Pattern/deviations: Step-through pattern   Gait velocity interpretation: Below normal speed for age/gender General Gait Details: steadier today, stays far behind RW with moderate use of the RW.  Maneuvered and avoided obstacles better today.   Stairs            Wheelchair Mobility     Modified Rankin (Stroke Patients Only)       Balance Overall balance assessment: Needs assistance Sitting-balance support: No upper extremity supported Sitting balance-Leahy Scale: Fair     Standing balance support: No upper extremity supported;Single extremity supported Standing balance-Leahy Scale: Fair Standing balance comment: cannot accept challenge.                    Cognition Arousal/Alertness: Awake/alert Behavior During Therapy: Restless (fidgety, but easier to redirect) Overall Cognitive Status: History of cognitive impairments - at baseline                      Exercises      General Comments        Pertinent Vitals/Pain Pain Assessment: Faces Faces Pain Scale: No hurt    Home Living                      Prior Function            PT Goals (current goals can now be found in the care plan section) Acute Rehab PT Goals PT Goal Formulation: With patient Time For Goal Achievement: 04/16/15 Potential to Achieve Goals: Good Progress towards PT goals: Progressing toward goals    Frequency  Min 3X/week    PT Plan Current plan remains appropriate    Co-evaluation             End of Session Equipment Utilized During Treatment: Gait belt Activity Tolerance: Patient tolerated treatment well Patient left: in bed;with call bell/phone within reach;with bed alarm set;with restraints reapplied  Time: 1225-8346 PT Time Calculation (min) (ACUTE ONLY): 28 min  Charges:  $Gait Training: 8-22 mins $Therapeutic Activity: 8-22 mins                    G Codes:      Jesse [name], Jesse [name] 04/08/2015, 2:05 PM  04/08/2015  Jesse [name], Jesse [name] [phone]  (pager)

## 2015-04-09 DIAGNOSIS — R1314 Dysphagia, pharyngoesophageal phase: Secondary | ICD-10-CM

## 2015-04-09 DIAGNOSIS — J9601 Acute respiratory failure with hypoxia: Secondary | ICD-10-CM

## 2015-04-09 DIAGNOSIS — G9340 Encephalopathy, unspecified: Secondary | ICD-10-CM | POA: Insufficient documentation

## 2015-04-09 DIAGNOSIS — J690 Pneumonitis due to inhalation of food and vomit: Secondary | ICD-10-CM

## 2015-04-09 DIAGNOSIS — Z515 Encounter for palliative care: Secondary | ICD-10-CM

## 2015-04-09 LAB — CBC
HCT: 33.8 % — ABNORMAL LOW (ref 39.0–52.0)
HEMOGLOBIN: 10.7 g/dL — AB (ref 13.0–17.0)
MCH: 30.1 pg (ref 26.0–34.0)
MCHC: 31.7 g/dL (ref 30.0–36.0)
MCV: 94.9 fL (ref 78.0–100.0)
Platelets: 460 10*3/uL — ABNORMAL HIGH (ref 150–400)
RBC: 3.56 MIL/uL — ABNORMAL LOW (ref 4.22–5.81)
RDW: 16.5 % — ABNORMAL HIGH (ref 11.5–15.5)
WBC: 10.5 10*3/uL (ref 4.0–10.5)

## 2015-04-09 LAB — GLUCOSE, CAPILLARY
GLUCOSE-CAPILLARY: 162 mg/dL — AB (ref 65–99)
GLUCOSE-CAPILLARY: 119 mg/dL — AB (ref 65–99)
GLUCOSE-CAPILLARY: 134 mg/dL — AB (ref 65–99)
Glucose-Capillary: 116 mg/dL — ABNORMAL HIGH (ref 65–99)

## 2015-04-09 LAB — BASIC METABOLIC PANEL
Anion gap: 7 (ref 5–15)
BUN: 9 mg/dL (ref 6–20)
CALCIUM: 8.9 mg/dL (ref 8.9–10.3)
CO2: 24 mmol/L (ref 22–32)
Chloride: 111 mmol/L (ref 101–111)
Creatinine, Ser: 1.12 mg/dL (ref 0.61–1.24)
GFR calc Af Amer: >60 mL/min (ref >60)
GLUCOSE: 147 mg/dL — AB (ref 65–99)
POTASSIUM: 3.7 mmol/L (ref 3.5–5.1)
Sodium: 142 mmol/L (ref 135–145)

## 2015-04-09 MED ORDER — POTASSIUM CHLORIDE 20 MEQ/15ML (10%) PO SOLN
40.0000 meq | Freq: Once | ORAL | Status: AC
  Administered 2015-04-09: 40 meq
  Filled 2015-04-09: qty 30

## 2015-04-09 MED ORDER — QUETIAPINE FUMARATE 25 MG PO TABS
25.0000 mg | ORAL_TABLET | Freq: Two times a day (BID) | ORAL | Status: AC
  Administered 2015-04-09 (×2): 25 mg via ORAL
  Filled 2015-04-09 (×4): qty 1

## 2015-04-09 MED ORDER — PANTOPRAZOLE SODIUM 40 MG IV SOLR
40.0000 mg | Freq: Two times a day (BID) | INTRAVENOUS | Status: DC
  Administered 2015-04-09 – 2015-04-15 (×12): 40 mg via INTRAVENOUS
  Filled 2015-04-09 (×16): qty 40

## 2015-04-09 NOTE — Progress Notes (Signed)
Pt noted to have a large bloody BM per rectum with clots present. VSS. MD [place] and surgery notified. Serial cbc's ordered, lovenox discontinued until further notice. Will continue to monitor.

## 2015-04-09 NOTE — Consult Note (Signed)
Consultation Note Date: 04/09/2015   Patient Name: Jesse [name]  DOB: Apr 12, 1941  MRN: [id number]  Age / Sex: 74 y.o., male   PCP: No primary care provider on file. Referring Physician: Verlee Monte, MD  Reason for Consultation: Establishing goals of care  Palliative Care Assessment and Plan Summary of Established Goals of Care and Medical Treatment Preferences    Palliative Care Discussion Held Today:   I met today with Mr. Highfill wife Alden Benjamin and sister Sharee Pimple was on over speaker phone. We discussed his dementia and current mental state in relation to QOL and quantity of life (especially in regards to his swallowing). They have good understanding but are processing all this information. He has memory issues at baseline and could not be left alone but pretty well functioning otherwise prior to admission. Enough that they came from [place] to [place] to visit Lena's daughter.   Discussed code status and described full comfort. They decide DNR and are open to comfort options understanding that he is very unlikely to get back to a QOL acceptable to him. No long term feeding tube but Alden Benjamin is not quite ready to d/c feeding tube and transition at this time - she needs time to process all this information. She has also been through the death of her first husband (Mr. Mahrt was friends with her husband and this is how they met) that is all coming back to her. She needs much support - will consult chaplain.   I provided emotional support and listening. 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 Very smart and successful gentleman. He used to enjoy drawing and horseback riding.   We will continue to follow for support and readiness to transition to more comfort focused care.    Contacts/Participants in Discussion: Primary Decision Maker: Wife   Goals of Care/Code Status/Advance Care Planning:   Code Status: DNR  Artificial feeding: no long term (continue in the  interim)   Symptom Management:   Pain: Could this be contributing to his agitation? No grimacing, denies pain. Continue fentanyl prn. Consider scheduled low dose pain meds if he seems to be having pain contributing agitation.   Dysphagia: Continue temp feeding tube for now. No long term. May need comfort feeds soon. SLP to continue following.   Agitation: Agree with trial of Seroquel - wife agrees as well. Reassess tomorrow.   Psycho-social/Spiritual:   Support System: Wife at bedside very supportive. Family in [place] also supportive but not here - sister likely to come soon.   Prognosis: Likely days to weeks with a transition to comfort.   Discharge Planning:  To be determined. We did discuss hospice facility.        Chief Complaint/HPI: 74 yo male with PMH dementia, type 2 diabetes, HTN, hepatitis C, diverticulosis. Admitted with GIB that led to emergent surgery requiring total colectomy. Since surgery continues to be agitated and encephalopathic with dysphagia. Not improving.   Primary Diagnoses  Present on Admission:  . HTN (hypertension) . Dementia . Acute GI bleeding . Acute blood loss anemia . Acute renal failure . GI bleeding . Oropharyngeal dysphagia  Palliative Review of Systems:   Denies pain.    I have reviewed the medical record, interviewed the patient and family, and examined the patient. The following aspects are pertinent.  Past Medical History  Diagnosis Date  . Memory loss   . Hypertension   . Diabetes mellitus without complication   . Diverticulosis  Of colon  . Hepatitis C     Virus has never been treated. No history of cirrhosis.  . Prostate cancer ~ 1990s.    Treated with radiation seed implant.  . Hiatal hernia     Does not require meds for reflux symptoms.   Social History   Social History  . Marital Status: Married    Spouse Name: N/A  . Number of Children: N/A  . Years of Education: N/A   Social History Main Topics  . Smoking  status: Never Smoker   . Smokeless tobacco: None  . Alcohol Use: Yes     Comment: occ  . Drug Use: No  . Sexual Activity: Not Asked   Other Topics Concern  . 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   History reviewed. No pertinent family history. Scheduled Meds: . ampicillin-sulbactam (UNASYN) IV  1.5 g Intravenous Q6H  . antiseptic oral rinse  7 mL Mouth Rinse q12n4p  . chlorhexidine  15 mL Mouth Rinse BID  . enoxaparin (LOVENOX) injection  40 mg Subcutaneous Q24H  . Influenza vac split quadrivalent PF  0.5 mL Intramuscular Tomorrow-1000  . insulin aspart  0-9 Units Subcutaneous 6 times per day  . pantoprazole (PROTONIX) IV  40 mg Intravenous Q24H  . QUEtiapine  25 mg Oral BID  . sodium chloride  3 mL Intravenous Q12H  . THROMBI-PAD  1 each Topical Once  . valproate sodium  250 mg Intravenous Q12H   Continuous Infusions: . feeding supplement (VITAL AF 1.2 CAL) 1,000 mL (04/09/15 1024)  . sodium chloride 0.45 % 1,000 mL with potassium chloride 20 mEq infusion 75 mL/hr at 04/08/15 2300   PRN Meds:.acetaminophen **OR** acetaminophen, fentaNYL (SUBLIMAZE) injection, hydrALAZINE, ondansetron **OR** ondansetron (ZOFRAN) IV Medications Prior to Admission:  Prior to Admission medications   Medication Sig Start Date End Date Taking? Authorizing Provider  lisinopril (PRINIVIL,ZESTRIL) 20 MG tablet Take 20 mg by mouth daily.   Yes Historical Provider, MD  metFORMIN (GLUCOPHAGE) 500 MG tablet Take 500 mg by mouth 2 (two) times daily with a meal.   Yes Historical Provider, MD   No Known Allergies CBC:    Component Value Date/Time   WBC 10.5 04/08/2015 0530   HGB 10.7* 04/08/2015 0530   HCT 33.8* 04/08/2015 0530   PLT 473* 04/08/2015 0530   MCV 94.2 04/08/2015 0530   NEUTROABS 10.0* 04/04/2015 0430   LYMPHSABS 0.9 04/04/2015 0430   MONOABS 1.3* 04/04/2015 0430   EOSABS 0.1 04/04/2015 0430   BASOSABS 0.0 04/04/2015 0430    Comprehensive Metabolic Panel:    Component Value Date/Time   NA 142 04/09/2015 0545   K 3.7 04/09/2015 0545   CL 111 04/09/2015 0545   CO2 24 04/09/2015 0545   BUN 9 04/09/2015 0545   CREATININE 1.12 04/09/2015 0545   GLUCOSE 147* 04/09/2015 0545   CALCIUM 8.9 04/09/2015 0545   AST 38 04/06/2015 0530   ALT 46 04/06/2015 0530   ALKPHOS 54 04/06/2015 0530   BILITOT 1.1 04/06/2015 0530   PROT 5.7* 04/06/2015 0530   ALBUMIN 2.0* 04/06/2015 0530    Physical Exam:  Vital Signs: BP 151/88 mmHg  Pulse 115  Temp(Src) 98.9 F (37.2 C) (Axillary)  Resp 19  Ht _0  (1.803 m)  Wt 93 kg (205 lb 0.4 oz)  BMI 28.61 kg/m2  SpO2 99% SpO2: SpO2: 99 % O2 Device: O2 Device: Not Delivered O2 Flow  Rate: O2 Flow Rate (L/min): 2.5 L/min Intake/output summary:  Intake/Output Summary (Last 24 hours) at 04/09/15 1129 Last data filed at 04/09/15 0800  Gross per 24 hour  Intake   3065 ml  Output   1510 ml  Net   1555 ml   LBM: Last BM Date: 04/07/15 Baseline Weight: Weight: 98.431 kg (217 lb) Most recent weight: Weight: 93 kg (205 lb 0.4 oz)  Exam Findings:  General: Agitated, sitting up in bed HEENT: [place]/AT, NG feeding tube in place Resp: No labored breathing Extrem: Warm, dry Neuro: Confused, responds better to wife, at times joking with her           Palliative Performance Scale: 30 %                Additional Data Reviewed: Recent Labs     04/07/15  0525  04/08/15  0530  04/09/15  0545  WBC  10.0  10.5   --   HGB  9.8*  10.7*   --   PLT  437*  473*   --   NA  144  142  142  BUN  _0 CREATININE  1.26*  1.11  1.12     Time In: 1145 Time Out: 1205 Time Total: 42mn  Greater than 50%  of this time was spent counseling and coordinating care related to the above assessment and plan.   Signed by:  AVinie Sill NP Palliative Medicine Team Pager # [phone](M-F 8a-5p) Team Phone # [phone](Nights/Weekends)

## 2015-04-09 NOTE — Progress Notes (Signed)
   04/09/15 1400  Clinical Encounter Type  Visited With Family  Visit Type Initial;Psychological support;Spiritual support  Referral From Palliative care team;Nurse  Spiritual Encounters  Spiritual Needs Prayer;Emotional  Stress Factors  Family Stress Factors Major life changes  CH met with wife Jesse [name]) and discussed her earlier meeting with the Palliative Care Team and their goals of care meeting; Pt wife was still dealing with recent move from [place] and the rapid pt decline; discussed goals of care, comfort care and the over all process; she reiterated having hope but that she is realizing that comfort care is likely; also mentioned that hospice is in the COA.  CH offered psychological, social and spiritual care and will offer prayer with pt and family later; If possible, move to 6N or other similar floor until hospice available would benefit wife psychological and spiritual state of mind.

## 2015-04-09 NOTE — Progress Notes (Signed)
[facility] PROGRESS NOTE  Jesse [name] WER:[id number] DOB: 12-02-1940 DOA: 03/27/2015  PCP: No primary care provider on file.  Brief HPI: 74 year old African-American male with a past medical history of dementia, type 2 diabetes, essential hypertension, hepatitis C, and a known history of diverticulosis, presented with massive GI bleeding. This required emergent total colectomy. During the course of this hospitalization, patient has remained encephalopathic. He also was noted to have severe dysphagia. He is requiring tube feedings.  Past medical history:  Past Medical History  Diagnosis Date  . Memory loss   . Hypertension   . Diabetes mellitus without complication   . Diverticulosis     Of colon  . Hepatitis C     Virus has never been treated. No history of cirrhosis.  . Prostate cancer ~ 1990s.    Treated with radiation seed implant.  . Hiatal hernia     Does not require meds for reflux symptoms.    Subjective: Seen with his nurse at bedside, mumbling. Awaiting the main with palliative care.  Assessment/Plan:   Aspiration pneumonia Patient was initially on vancomycin and Zosyn. He was transitioned to ceftriaxone on 9/8. There was increase in white count to 12 on 9/9 when he was changed back to vancomycin and Zosyn. WBC is improved. He remains afebrile. He is completed 7 days of treatment for strep pneumoniae identified on sputum culture. Patient was transitioned to Unasyn.   Acute massive diverticular bleed, status post total colectomy General surgery is following. Pain medications as needed. This issue remains stable. He has a colostomy which appears to be functioning well.  Dysphagia Seen by speech therapy and is nothing by mouth. Patient underwent modified barium swallow 9/10. His dysphagia appears to be oropharyngeal. He is noted to have a history of dementia, which could be contributing. Does not have any focal neurological deficits. Continue with nasal feeding tube  for now. Discussed with his wife regarding long-term plan. It may be reasonable to try nasal feeding to see if patient's general condition improves. But in all likelihood his dysphagia will persist. Patient's wife was taken aback by this opinion. She was explained regarding the diagnosis. She was told about the findings on MRI. She was explained the natural progression of somebody with dementia. We also discussed more permanent options such as PEG tube. But the concern is that patient will pull out this tube as he remains agitated. She is also agreeable to discussing with palliative medicine, regarding goals of care.   Acute encephalopathy This is in the setting of baseline dementia. His mental status appears to be at baseline per his wife. Patient's dementia was initially diagnosed about 4-5 years ago. It appears to have progressed. Decline could have been rapid these last few weeks due to acute illness. His TSH, B-12 level was normal. RPR and HIV is nonreactive.  MRI brain shows chronic microvascular ischemia. No acute processes identified. Atrophy is noted.  I started him on low-dose of Seroquel  Hypokalemia This is repleted intravenously.  Acute blood loss anemia Secondary to GI bleeding. Patient was transfused PRBC. Hemoglobin is stable  History of type 2 diabetes Continue monitoring CBGs. Continue sliding scale coverage.  History of essential hypertension Blood pressures noted to be elevated at times. Hydralazine as needed   DVT Prophylaxis: Lovenox    Code Status: Full code  Family Communication: Discussed with his wife in detail. Disposition Plan: Remain in step down for now. Consider palliative medicine. Depakote to be initiated today to control his  agitation in case family wants to proceed with PEG tube, which in a state of agitation, patient will likely pull out. Consultants: Gen. surgery. Palliative medicine consulted today.  Procedures:   Emergent exploratory laparotomy with  total colectomy on 9/3.  EGD 9/3 which was normal with no source of GI bleeding  Cystoscopy and insertion of Foley catheter and bladder irrigation 9/3  Antibiotics: Had been on vancomycin and Zosyn. Has completed treatment for strep pneumoniae. Now being treated for aspiration pneumonia.  Unasyn initiated 9/11   Objective: Vital Signs  Filed Vitals:   04/08/15 2335 04/09/15 0305 04/09/15 0500 04/09/15 0725  BP: 145/98 159/105  151/88  Pulse: 111 119  115  Temp:  97.9 F (36.6 C)  98.9 F (37.2 C)  TempSrc:  Oral  Axillary  Resp: 26 24  19   Height:      Weight:   93 kg (205 lb 0.4 oz)   SpO2: 100% 100%  99%    Intake/Output Summary (Last 24 hours) at 04/09/15 1207 Last data filed at 04/09/15 1100  Gross per 24 hour  Intake 4098.5 ml  Output   1335 ml  Net 2763.5 ml   Filed Weights   04/07/15 0400 04/08/15 0400 04/09/15 0500  Weight: 92.7 kg (204 lb 5.9 oz) 92.4 kg (203 lb 11.3 oz) 93 kg (205 lb 0.4 oz)    General appearance: alert, distracted and no distress Resp: Diminished air entry at the bases. No crackles or wheezing. Cardio: regular rate and rhythm, S1, S2 normal, no murmur, click, rub or gallop GI: Covered with dressing. Tender to palpation. Bowel sounds sluggish. Neurologic: Alert. Distracted. Disoriented to time, place, person. No facial asymmetry. Able to move all his extremities without difficulties. No focal deficits identified.  Lab Results:  Basic Metabolic Panel:  Recent Labs Lab 04/05/15 0510 04/05/15 0812 04/06/15 0530 04/07/15 0525 04/08/15 0530 04/09/15 0545  NA 145  --  147* 144 142 142  K 3.0*  --  3.0* 3.2* 3.4* 3.7  CL 104  --  106 107 108 111  CO2 31  --  32 28 26 24   GLUCOSE 121*  --  116* 108* 131* 147*  BUN 7  --  8 9 9 9   CREATININE 1.20  --  1.34* 1.26* 1.11 1.12  CALCIUM 8.5*  --  8.6* 8.6* 8.8* 8.9  MG  --  2.1  --   --  2.1  --    Liver Function Tests:  Recent Labs Lab 04/05/15 0510 04/06/15 0530  AST 39 38  ALT  46 46  ALKPHOS 58 54  BILITOT 1.0 1.1  PROT 5.5* 5.7*  ALBUMIN 2.0* 2.0*   CBC:  Recent Labs Lab 04/03/15 0317  04/04/15 0430 04/05/15 0812 04/06/15 0530 04/07/15 0525 04/08/15 0530  WBC 7.2  < > 12.3* 12.1* 10.8* 10.0 10.5  NEUTROABS 5.0  --  10.0*  --   --   --   --   HGB 6.7*  < > 10.2* 10.2* 10.0* 9.8* 10.7*  HCT 20.5*  < > 30.8* 31.5* 30.8* 31.4* 33.8*  MCV 90.2  < > 90.3 92.4 94.2 94.3 94.2  PLT 212  < > 344 407* 421* 437* 473*  < > = values in this interval not displayed.  CBG:  Recent Labs Lab 04/08/15 1705 04/08/15 2004 04/08/15 2329 04/09/15 0542 04/09/15 0733  GLUCAP 141* 108* 122* 162* 116*    Recent Results (from the past 240 hour(s))  Culture, respiratory (NON-Expectorated)  Status: None   Collection Time: 03/30/15  4:08 PM  Result Value Ref Range Status   Specimen Description TRACHEAL ASPIRATE  Final   Special Requests Normal  Final   Gram Stain   Final    ABUNDANT WBC PRESENT,BOTH PMN AND MONONUCLEAR NO SQUAMOUS EPITHELIAL CELLS SEEN MODERATE GRAM POSITIVE COCCI IN PAIRS RARE GRAM NEGATIVE RODS RARE GRAM POSITIVE COCCI    Culture   Final    MODERATE STREPTOCOCCUS PNEUMONIAE Performed at [facility]    Report Status 04/03/2015 FINAL  Final   Organism ID, Bacteria STREPTOCOCCUS PNEUMONIAE  Final      Susceptibility   Streptococcus pneumoniae - MIC (ETEST)*    CEFTRIAXONE 0.25 SENSITIVE Sensitive     LEVOFLOXACIN 0.75 SENSITIVE Sensitive     PENICILLIN 1.0 INTERMEDIATE Intermediate     * MODERATE STREPTOCOCCUS PNEUMONIAE  Culture, Urine     Status: None   Collection Time: 03/31/15 10:45 AM  Result Value Ref Range Status   Specimen Description URINE, CATHETERIZED  Final   Special Requests NONE  Final   Culture NO GROWTH 1 DAY  Final   Report Status 04/01/2015 FINAL  Final  Culture, blood (routine x 2)     Status: None   Collection Time: 03/31/15 10:55 AM  Result Value Ref Range Status   Specimen Description BLOOD LEFT  THUMB  Final   Special Requests IN PEDIATRIC BOTTLE 1CC  Final   Culture NO GROWTH 5 DAYS  Final   Report Status 04/05/2015 FINAL  Final  Culture, respiratory (NON-Expectorated)     Status: None   Collection Time: 04/01/15  8:56 AM  Result Value Ref Range Status   Specimen Description TRACHEAL ASPIRATE  Final   Special Requests NONE  Final   Gram Stain   Final    ABUNDANT WBC PRESENT,BOTH PMN AND MONONUCLEAR NO SQUAMOUS EPITHELIAL CELLS SEEN ABUNDANT GRAM POSITIVE COCCI IN PAIRS Performed at [facility]    Culture   Final    NO GROWTH 2 DAYS Performed at [facility]    Report Status 04/03/2015 FINAL  Final      Studies/Results: No results found.  Medications:  Scheduled: . ampicillin-sulbactam (UNASYN) IV  1.5 g Intravenous Q6H  . antiseptic oral rinse  7 mL Mouth Rinse q12n4p  . chlorhexidine  15 mL Mouth Rinse BID  . enoxaparin (LOVENOX) injection  40 mg Subcutaneous Q24H  . Influenza vac split quadrivalent PF  0.5 mL Intramuscular Tomorrow-1000  . insulin aspart  0-9 Units Subcutaneous 6 times per day  . pantoprazole (PROTONIX) IV  40 mg Intravenous Q24H  . QUEtiapine  25 mg Oral BID  . sodium chloride  3 mL Intravenous Q12H  . THROMBI-PAD  1 each Topical Once  . valproate sodium  250 mg Intravenous Q12H   Continuous: . feeding supplement (VITAL AF 1.2 CAL) 1,000 mL (04/09/15 1024)  . sodium chloride 0.45 % 1,000 mL with potassium chloride 20 mEq infusion 75 mL/hr at 04/08/15 2300   OXB:[id number] **OR** acetaminophen, fentaNYL (SUBLIMAZE) injection, hydrALAZINE, ondansetron **OR** ondansetron (ZOFRAN) IV    Principal Problem:   Acute GI bleeding Active Problems:   HTN (hypertension)   Diabetes mellitus type 2, controlled   Dementia   Hiatal hernia   History of diverticulitis   History of prostate cancer   Acute blood loss anemia   Acute renal failure   GI bleeding   Hematochezia   Acute respiratory failure with hypoxia   History  of diverticulosis  Aspiration pneumonia   Oropharyngeal dysphagia   Hypokalemia       LOS: 13 days   [facility] A  [facility] Pager [phone]  04/09/2015, 12:07 PM  If 7PM-7AM, please contact night-coverage at [email or web address], password [facility]

## 2015-04-09 NOTE — Progress Notes (Signed)
UR COMPLETED  

## 2015-04-09 NOTE — Progress Notes (Signed)
Speech Language Pathology Treatment: Dysphagia  Patient Details Name: Jesse [name] MRN: [id number] DOB: 04-29-1941 Today's Date: 04/09/2015 Time: 4562-5638 SLP Time Calculation (min) (ACUTE ONLY): 32 min  Assessment / Plan / Recommendation Clinical Impression  SLP provided ice chip and pureed trials, with Max multimodal cueing for redirection to task and sustained attention to oral manipulation/transit. Pt is disoriented and frequently tries to talk during oral preparation. Wet vocal quality is present again at baseline, but is able to be cleared using wife as a therapeutic agent. Unfortunately, POs provided continue to elicit a wet vocal quality and delayed coughing, concerning for airway compromise, and altered mentation impacts his ability to follow commands to attempt compensatory techniques. Wife was present throughout the session and was educated about the impact pt's current mentation is playing on his swallowing function.    HPI Other Pertinent Information: Pt is a 74 yo male with a PMH of dementia, type 2 DM treated with oral agents, HTN. Hepatitis C positive, diverticulosis on colonoscopy, prostate cancer. Pt was admitted on 03/27/15 when the pt was found unresponsive and in shock (hemorhagic shock). MD suspected lower GIB likely diverticular, with hemmorhagic shock, pt had an emergent subtotal colectomy. CXR shows improving lower right lobe infiltrate. Pt was intubated 03/29/15 - 03/30/15, 03/30/15-04/02/15, currently NPO.    Pertinent Vitals Pain Assessment: Faces Faces Pain Scale: No hurt  SLP Plan  Continue with current plan of care    Recommendations Diet recommendations: NPO Medication Administration: Via alternative means       Oral Care Recommendations: Oral care QID Follow up Recommendations: Skilled Nursing facility;24 hour supervision/assistance Plan: Continue with current plan of care    Germain Osgood, M.A. CCC-SLP [phone]  Germain Osgood 04/09/2015, 11:36  AM

## 2015-04-09 NOTE — Care Management Important Message (Signed)
Important Message  Patient Details  Name: Jesse [name] MRN: [id number] Date of Birth: 1940-12-02   Medicare Important Message Given:  Yes-fourth notification given    Nathen May 04/09/2015, 11:59 AM

## 2015-04-10 DIAGNOSIS — R1314 Dysphagia, pharyngoesophageal phase: Secondary | ICD-10-CM

## 2015-04-10 DIAGNOSIS — E119 Type 2 diabetes mellitus without complications: Secondary | ICD-10-CM

## 2015-04-10 DIAGNOSIS — G9340 Encephalopathy, unspecified: Secondary | ICD-10-CM

## 2015-04-10 LAB — CBC
HCT: 32.3 % — ABNORMAL LOW (ref 39.0–52.0)
HEMATOCRIT: 33.4 % — AB (ref 39.0–52.0)
HEMOGLOBIN: 10.7 g/dL — AB (ref 13.0–17.0)
Hemoglobin: 10.2 g/dL — ABNORMAL LOW (ref 13.0–17.0)
MCH: 30.1 pg (ref 26.0–34.0)
MCH: 30.7 pg (ref 26.0–34.0)
MCHC: 31.6 g/dL (ref 30.0–36.0)
MCHC: 32 g/dL (ref 30.0–36.0)
MCV: 95.3 fL (ref 78.0–100.0)
MCV: 96 fL (ref 78.0–100.0)
PLATELETS: 457 10*3/uL — AB (ref 150–400)
Platelets: 441 10*3/uL — ABNORMAL HIGH (ref 150–400)
RBC: 3.39 MIL/uL — ABNORMAL LOW (ref 4.22–5.81)
RBC: 3.48 MIL/uL — ABNORMAL LOW (ref 4.22–5.81)
RDW: 16.8 % — AB (ref 11.5–15.5)
RDW: 16.6 % — ABNORMAL HIGH (ref 11.5–15.5)
WBC: 10.6 10*3/uL — ABNORMAL HIGH (ref 4.0–10.5)
WBC: 13.5 10*3/uL — ABNORMAL HIGH (ref 4.0–10.5)

## 2015-04-10 LAB — BASIC METABOLIC PANEL
Anion gap: 7 (ref 5–15)
BUN: 9 mg/dL (ref 6–20)
CALCIUM: 8.7 mg/dL — AB (ref 8.9–10.3)
CO2: 25 mmol/L (ref 22–32)
CREATININE: 1.21 mg/dL (ref 0.61–1.24)
Chloride: 110 mmol/L (ref 101–111)
GFR calc non Af Amer: 58 mL/min — ABNORMAL LOW (ref >60)
GLUCOSE: 107 mg/dL — AB (ref 65–99)
Potassium: 4.1 mmol/L (ref 3.5–5.1)
Sodium: 142 mmol/L (ref 135–145)

## 2015-04-10 LAB — CBC WITH DIFFERENTIAL/PLATELET
Basophils Absolute: 0.1 10*3/uL (ref 0.0–0.1)
Basophils Relative: 1 %
EOS ABS: 0.6 10*3/uL (ref 0.0–0.7)
EOS PCT: 5 %
HCT: 32.9 % — ABNORMAL LOW (ref 39.0–52.0)
HEMOGLOBIN: 10.3 g/dL — AB (ref 13.0–17.0)
LYMPHS ABS: 1.7 10*3/uL (ref 0.7–4.0)
Lymphocytes Relative: 16 %
MCH: 29.9 pg (ref 26.0–34.0)
MCHC: 31.3 g/dL (ref 30.0–36.0)
MCV: 95.6 fL (ref 78.0–100.0)
MONOS PCT: 5 %
Monocytes Absolute: 0.6 10*3/uL (ref 0.1–1.0)
NEUTROS PCT: 72 %
Neutro Abs: 7.6 10*3/uL (ref 1.7–7.7)
Platelets: 448 10*3/uL — ABNORMAL HIGH (ref 150–400)
RBC: 3.44 MIL/uL — ABNORMAL LOW (ref 4.22–5.81)
RDW: 16.8 % — ABNORMAL HIGH (ref 11.5–15.5)
WBC: 10.5 10*3/uL (ref 4.0–10.5)

## 2015-04-10 LAB — GLUCOSE, CAPILLARY
GLUCOSE-CAPILLARY: 122 mg/dL — AB (ref 65–99)
GLUCOSE-CAPILLARY: 97 mg/dL (ref 65–99)
GLUCOSE-CAPILLARY: 99 mg/dL (ref 65–99)
Glucose-Capillary: 133 mg/dL — ABNORMAL HIGH (ref 65–99)
Glucose-Capillary: 109 mg/dL — ABNORMAL HIGH (ref 65–99)
Glucose-Capillary: 99 mg/dL (ref 65–99)

## 2015-04-10 MED ORDER — QUETIAPINE FUMARATE 25 MG PO TABS
25.0000 mg | ORAL_TABLET | Freq: Every day | ORAL | Status: DC
  Filled 2015-04-10: qty 1

## 2015-04-10 MED ORDER — VITAL AF 1.2 CAL PO LIQD
1000.0000 mL | ORAL | Status: DC
  Filled 2015-04-10 (×4): qty 1000

## 2015-04-10 NOTE — Progress Notes (Addendum)
Pt chewed mitten off and pulled PANDA (ngt). TF not resumed. Paged Dr. Aletha Halim continue to monitor. Pt to [place] [facility] to 5W-02, VSS, called report. Called wife to notify of [facility].

## 2015-04-10 NOTE — Progress Notes (Signed)
Physical Therapy Treatment Patient Details Name: Jesse [name] MRN: [id number] DOB: 09-16-1940 Today's Date: 04/10/2015    History of Present Illness Pt is a 74 y/o male with a PMH of dementia (wife reports sundowning), DMII, HTN, hep-C. Pt presents with GI bleed requiring emergent subtotal colectomy.     PT Comments    Gait stability progressing well.  Pt able to stay longer periods on task without fidgeting with lines/tubes.  Mitts remain.   Follow Up Recommendations  SNF;Other (comment) (may try to get pt home.)     Equipment Recommendations   (RW to be determined before going home)    Recommendations for Other Services       Precautions / Restrictions Precautions Precautions: Fall Precaution Comments: Colostomy, NG tube, Abd incision. 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 Needed 6 people to restrain him at one point.    Mobility  Bed Mobility Overal bed mobility: Needs Assistance Bed Mobility: Supine to Sit     Supine to sit: Min assist (up via R elbow)     General bed mobility comments: cues for guidance and direction  Transfers Overall transfer level: Needs assistance Equipment used: 1 person hand held assist Transfers: Sit to/from Stand Sit to Stand: Min assist         General transfer comment: assist to come forward and postural cues  Ambulation/Gait Ambulation/Gait assistance: Min assist;Min guard Ambulation Distance (Feet): 250 Feet Assistive device: None;1 person hand held assist Gait Pattern/deviations: Step-through pattern;Scissoring;Drifts right/left (mild staggering at times) Gait velocity: slower Gait velocity interpretation: Below normal speed for age/gender General Gait Details: No assistive device, but pt more guarde with short step length when not assisted minimally   Stairs            Wheelchair Mobility    Modified Rankin (Stroke Patients Only)       Balance Overall balance  assessment: Needs assistance Sitting-balance support: No upper extremity supported Sitting balance-Leahy Scale: Fair     Standing balance support: No upper extremity supported;Single extremity supported Standing balance-Leahy Scale: Fair                      Cognition Arousal/Alertness: Awake/alert Behavior During Therapy: WFL for tasks assessed/performed Overall Cognitive Status: History of cognitive impairments - at baseline                      Exercises      General Comments        Pertinent Vitals/Pain Pain Assessment: No/denies pain Faces Pain Scale: No hurt    Home Living                      Prior Function            PT Goals (current goals can now be found in the care plan section) Acute Rehab PT Goals PT Goal Formulation: With patient Time For Goal Achievement: 04/16/15 Potential to Achieve Goals: Good Progress towards PT goals: Progressing toward goals    Frequency  Min 3X/week    PT Plan Current plan remains appropriate    Co-evaluation             End of Session Equipment Utilized During Treatment: Gait belt Activity Tolerance: Patient tolerated treatment well Patient left: in chair;with call bell/phone within reach;with family/visitor present     Time: 1448-1856 PT Time Calculation (min) (ACUTE ONLY): 13 min  Charges:  $Gait  Training: 8-22 mins                    G Codes:      Jesse [name], Jesse [name] 04/10/2015, 1:09 PM 04/10/2015  Donnella Sham, PT [phone]  (pager)

## 2015-04-10 NOTE — Consult Note (Signed)
Daily Rounding Note  04/10/2015, 9:48 AM  LOS: 14 days   SUBJECTIVE:       Asked to reconsult on pt  Initial GI inpt eval with Dr Fuller Plan on 9/2 for painless hematochezia in demented 74 y/o. Patient and his wife has been residents of [place], [place]. However they're probably going to be moving to [place]. 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 HTN. 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 Hiatal hernia on EGD ~ 2011. Diverticulosis on colonoscopy ~ 2011. GI bleed with hematochezia in 2015, wife not sure what the source was; did not undergo repeat endoscopic evaluation then.   The presenting hematochezia evolved into life threatening bleed with hemodynamic collapse. . EGD in AM of 9/3 assured that there was no UGI source for bleeding and pt went for 9/3 emergent ex lap with total colectomy, ileostomy.  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   Has received at least 15 units of RRBCs during admission. Last on 9/6.  Last FFP was 9/5.  Last platelets 9/3.   Post op issues included SLP confirmed dysphagia due to cognitive and possible neurogenic issues related to his dementia and he was made NPO as of yesterday.  Rll infiltrate improving (intubated 9/3 - 9/7) and pt on Unasyn.  Stool per ostomy had been brown but as of 9/14, yesterday, evening is passing clots of red blood. RN describes as large volume, but only a single incidence with return to brown stool within 4 hours.  Stool this AM in watery/brown.  Lovenox and TF discontinued.    Hgb went from 10.7 yesterday to 10.2 this AM  Pt has some tenderness on abdominal exam He is no code blue after Pal care meeting 9/14.    OBJECTIVE:         Vital signs in last 24 hours:    Temp:  [98 F (36.7 C)-99.4 F (37.4 C)] 98.3 F (36.8 C) (09/15  0508) Pulse Rate:  [90-116] 90 (09/15 0800) Resp:  [18-30] 22 (09/15 0800) BP: (123-150)/(60-96) 142/90 mmHg (09/15 0800) SpO2:  [35 %-99 %] 98 % (09/15 0800) Weight:  [205 lb 14.6 oz (93.4 kg)] 205 lb 14.6 oz (93.4 kg) (09/15 0508) Last BM Date: 04/09/15 Filed Weights   04/08/15 0400 04/09/15 0500 04/10/15 0508  Weight: 203 lb 11.3 oz (92.4 kg) 205 lb 0.4 oz (93 kg) 205 lb 14.6 oz (93.4 kg)   General: non-verbal but alert Heart: RRR Chest: wet/gugling vocal quality, not coughing.  No increased WOB.  Abdomen: stapled healthy appearing long midline incision, some distention, unable to gage tenderness but no grimacing to palpation. BS hypoactive but not tinkling or tympanitic.  Watery/liquid medium brown stool in ostomy: absolutely no blood Extremities: moving all 4 limbs Neuro/Psych:  Non-verbal at present, awake, moves all 4 limbs, not currently agitated  Intake/Output from previous day: 09/14 0701 - 09/15 0700 In: 3304.8 [I.V.:1833.8; NG/GT:1166; IV Piggyback:305] Out: 1875 [Urine:1025; Stool:850]  Intake/Output this shift: Total I/O In: 116.3 [I.V.:116.3] Out: -   Lab Results:  Recent Labs  04/08/15 0530 04/09/15 1840 04/10/15 0315  WBC 10.5 10.5 10.6*  HGB 10.7* 10.7* 10.2*  HCT 33.8* 33.8* 32.3*  PLT 473* 460* 457*   BMET  Recent Labs  04/08/15 0530 04/09/15 0545 04/10/15 0315  NA 142 142 142  K 3.4* 3.7 4.1  CL 108 111  110  CO2 26 24 25   GLUCOSE 131* 147* 107*  BUN 9 9 9   CREATININE 1.11 1.12 1.21  CALCIUM 8.8* 8.9 8.7*   LFT No results for input(s): PROT, ALBUMIN, AST, ALT, ALKPHOS, BILITOT, BILIDIR, IBILI in the last 72 hours. PT/INR No results for input(s): LABPROT, INR in the last 72 hours. Hepatitis Panel No results for input(s): HEPBSAG, HCVAB, HEPAIGM, HEPBIGM in the last 72 hours.  Studies/Results: No results found.   Scheduled Meds: . ampicillin-sulbactam (UNASYN) IV  1.5 g Intravenous Q6H  . antiseptic oral rinse  7 mL Mouth Rinse  q12n4p  . chlorhexidine  15 mL Mouth Rinse BID  . Influenza vac split quadrivalent PF  0.5 mL Intramuscular Tomorrow-1000  . insulin aspart  0-9 Units Subcutaneous 6 times per day  . pantoprazole (PROTONIX) IV  40 mg Intravenous Q12H  . sodium chloride  3 mL Intravenous Q12H  . THROMBI-PAD  1 each Topical Once  . valproate sodium  250 mg Intravenous Q12H   Continuous Infusions: . sodium chloride 0.45 % 1,000 mL with potassium chloride 20 mEq infusion 75 mL/hr at 04/10/15 0211   PRN Meds:.acetaminophen **OR** acetaminophen, fentaNYL (SUBLIMAZE) injection, hydrALAZINE, ondansetron **OR** ondansetron (ZOFRAN) IV   ASSESMENT:   *  S/p post emergent subtotal colectomy with ileostomy for life threatening LGIB, likely diverticular. Limited hematochezia last PM.  Resolved within a few hours after stopping Lovenox  *  Dysphagia.  NPO currently. Tube feeds on hold.   *  Advanced dementia. NCB advanced care directives in place.    PLAN   *  CBC BID x 2.   Leave off Lovenox No plans to scope pt currently unless evidence of persistent bleeding.   *  Ok to resume the tube feeds.     Azucena Freed  04/10/2015, 9:48 AM Pager: [phone]  Attending Addendum: I have taken an interval history, reviewed the chart, and examined the patient. I agree with the Advanced Practitioner's note and impression. Patient s/p colectomy with ileostomy had some bloody output yesterday with mild drop in Hgb. Has been putting out brown stool throughout the day and checked this evening and more of the same. No evidence of active bleeding. Last CBC stable. If bleeding recurs or persist please contact us and will consider ileoscopy.   [place] Cellar, MD [facility]astro[facility] Pager [phone]

## 2015-04-10 NOTE — Progress Notes (Signed)
Daily Progress Note   Patient Name: Jesse [name]       Date: 04/10/2015 DOB: 1941-02-18  Age: 74 y.o. MRN#: [id number] Attending Physician: Verlee Monte, MD Primary Care Physician: No primary care provider on file. Admit Date: 03/27/2015  Reason for Consultation/Follow-up: Establishing goals of care  Subjective: 74 year old African-American male with a past medical history of dementia, type 2 diabetes, essential hypertension, hepatitis C, and a known history of diverticulosis, presented with massive GI bleeding. This required emergent total colectomy. During the course of this hospitalization, patient has remained encephalopathic. He also was noted to have severe dysphagia. He is requiring tube feedings. Has been encephalopathic.  Interval Events: Met with patient and his wife today. She reports that he is doing much better and he is alert and interactive during our encounter. He reports that he is feeling much better today, and his wife reports that she is amazed at how much he has improved over the last day or so. He was seen by speech therapy and hope that he will continue to see improvement in swallow function.  Length of Stay: 14 days  Current Medications: Scheduled Meds:  . ampicillin-sulbactam (UNASYN) IV  1.5 g Intravenous Q6H  . antiseptic oral rinse  7 mL Mouth Rinse q12n4p  . chlorhexidine  15 mL Mouth Rinse BID  . Influenza vac split quadrivalent PF  0.5 mL Intramuscular Tomorrow-1000  . insulin aspart  0-9 Units Subcutaneous 6 times per day  . pantoprazole (PROTONIX) IV  40 mg Intravenous Q12H  . QUEtiapine  25 mg Per Tube QHS  . sodium chloride  3 mL Intravenous Q12H  . THROMBI-PAD  1 each Topical Once  . valproate sodium  250 mg Intravenous Q12H    Continuous Infusions: . feeding supplement (VITAL AF 1.2 CAL)    . sodium chloride 0.45 % 1,000 mL with potassium chloride 20 mEq infusion 75 mL/hr at 04/10/15 0211    PRN Meds: acetaminophen **OR** acetaminophen,  fentaNYL (SUBLIMAZE) injection, hydrALAZINE, ondansetron **OR** ondansetron (ZOFRAN) IV  Palliative Performance Scale: 30%     Vital Signs: BP 135/95 mmHg  Pulse 100  Temp(Src) 97 F (36.1 C) (Oral)  Resp 26  Ht 5' 11"  (1.803 m)  Wt 93.4 kg (205 lb 14.6 oz)  BMI 28.73 kg/m2  SpO2 99% SpO2: SpO2: 99 % O2 Device: O2 Device: Not Delivered O2 Flow Rate: O2 Flow Rate (L/min): 2.5 L/min  Intake/output summary:  Intake/Output Summary (Last 24 hours) at 04/10/15 1742 Last data filed at 04/10/15 1700  Gross per 24 hour  Intake 2642.5 ml  Output   1100 ml  Net 1542.5 ml   LBM:   Baseline Weight: Weight: 98.431 kg (217 lb) Most recent weight: Weight: 93.4 kg (205 lb 14.6 oz)  Physical Exam: General: No distress, sitting up in bed HEENT: [place]/AT, NG feeding tube in place Resp: No labored breathing Extrem: Warm, dry Neuro: Confused but pleasant and conversational, at times joking with his wife            Additional Data Reviewed: Recent Labs     04/09/15  0545   04/10/15  0315  04/10/15  1015  WBC   --    < >  10.6*  10.5  HGB   --    < >  10.2*  10.3*  PLT   --    < >  457*  448*  NA  142   --   142   --   BUN  9   --   9   --   CREATININE  1.12   --   1.21   --    < > = values in this interval not displayed.     Problem List:  Patient Active Problem List   Diagnosis Date Noted  . Palliative care encounter 04/09/2015  . Dysphagia, pharyngoesophageal phase 04/09/2015  . Encephalopathy   . Oropharyngeal dysphagia 04/07/2015  . Hypokalemia 04/07/2015  . Aspiration pneumonia   . Hematochezia   . Acute respiratory failure with hypoxia   . History of diverticulosis   . HTN (hypertension) 03/27/2015  . Diabetes mellitus type 2, controlled 03/27/2015  . Dementia 03/27/2015  . Acute GI bleeding 03/27/2015  . Hiatal hernia 03/27/2015  . History of diverticulitis 03/27/2015  . History of prostate cancer 03/27/2015  . Acute blood loss anemia 03/27/2015  . Acute renal  failure 03/27/2015  . GI bleeding 03/27/2015     Palliative Care Assessment & Plan    Code Status:  DNR  Goals of Care:  Patient seems to be improving from mental status standpoint and was able to work more with speech therapy today. His wife is hopeful that this may be a sign he is improving and would like to see how he continues to progress over the next day or so prior to making any decisions about long-term goals moving forward. I will meet with family tomorrow in order to continue discussion.  Symptom Management:  Pain: Appears well controlled today, and will continue same  Agitation: No signs of agitation on examination today. We'll continue Seroquel   Psycho-social/Spiritual:  Desire for further Chaplaincy support:no   Prognosis: Unable to determine as waiting to see if his mental status and subsequently swallow function improved. He does remain at high risk for aspiration Discharge Planning: To be determined   Care plan was discussed with patient and his wife  Thank you for allowing the Palliative Medicine Team to assist in the care of this patient.   Time In: 1600 Time Out: 1630 Total Time 30 Prolonged Time Billed no    Greater than 50%  of this time was spent counseling and coordinating care related to the above assessment and plan.   Micheline Rough, MD  04/10/2015, 5:42 PM  Please contact Palliative Medicine Team phone at [phone] for questions and concerns.

## 2015-04-10 NOTE — Progress Notes (Signed)
[facility] PROGRESS NOTE  Jesse [name] EFE:[id number] DOB: February 25, 1941 DOA: 03/27/2015  PCP: No primary care provider on file.  Brief HPI: 74 year old African-American male with a past medical history of dementia, type 2 diabetes, essential hypertension, hepatitis C, and a known history of diverticulosis, presented with massive GI bleeding. This required emergent total colectomy. During the course of this hospitalization, patient has remained encephalopathic. He also was noted to have severe dysphagia. He is requiring tube feedings.  Past medical history:  Past Medical History  Diagnosis Date  . Memory loss   . Hypertension   . Diabetes mellitus without complication   . Diverticulosis     Of colon  . Hepatitis C     Virus has never been treated. No history of cirrhosis.  . Prostate cancer ~ 1990s.    Treated with radiation seed implant.  . Hiatal hernia     Does not require meds for reflux symptoms.    Subjective: Has had some bleeding coming out of the ileostomy opening, the tube feeding held yesterday. Seen by general surgery and GI, both has no plans for intervention and recommended to restart tube feeding.  Assessment/Plan:   Aspiration pneumonia Patient was initially on vancomycin and Zosyn. He was transitioned to ceftriaxone on 9/8. There was increase in white count to 12 on 9/9 when he was changed back to vancomycin and Zosyn. WBC is improved. He remains afebrile. He is completed 7 days of treatment for strep pneumoniae identified on sputum culture.  Currently on Unasyn, continue.  Acute massive diverticular bleed, status post total colectomy General surgery is following. Pain medications as needed. This issue remains stable.  Ileostomy opening bled on 04/09/2015, no bleeding currently. Continue twice a day Protonix and discontinue Lovenox.  Dysphagia Seen by speech therapy and is nothing by mouth. Patient underwent modified barium swallow 9/10.  His dysphagia  appears to be oropharyngeal. He is noted to have a history of dementia, which could be contributing.  Does not have any focal neurological deficits. Continue with nasal feeding tube for now. Discussed with his wife regarding long-term plan. It may be reasonable to try nasal feeding to see if patient's general condition improves. But in all likelihood his dysphagia will persist. Patient's wife was taken aback by this opinion. She was explained regarding the diagnosis. She was told about the findings on MRI.  Palliative care met with the patient's wife and recommended to continue to feeding for now but no long-term tube feeding  Acute encephalopathy This is in the setting of baseline dementia. His mental status appears to be at baseline per his wife. Patient's dementia was initially diagnosed about 4-5 years ago. It appears to have progressed. Decline could have been rapid these last few weeks due to acute illness. His TSH, B-12 level was normal. RPR and HIV is nonreactive.  MRI brain shows chronic microvascular ischemia. No acute processes identified. Atrophy is noted.  I started him on low-dose of Seroquel  Hypokalemia This is repleted intravenously.  Acute blood loss anemia Secondary to GI bleeding. Patient was transfused PRBC. Hemoglobin is stable  History of type 2 diabetes Continue monitoring CBGs. Continue sliding scale coverage.  History of essential hypertension Blood pressures noted to be elevated at times. Hydralazine as needed   DVT Prophylaxis: Lovenox    Code Status: Full code  Family Communication: Discussed with his wife in detail. Disposition Plan: Remain in step down for now. Consider palliative medicine. Depakote to be initiated today to control his  agitation in case family wants to proceed with PEG tube, which in a state of agitation, patient will likely pull out. Consultants: Gen. surgery. Palliative medicine consulted today.  Procedures:   Emergent exploratory  laparotomy with total colectomy on 9/3.  EGD 9/3 which was normal with no source of GI bleeding  Cystoscopy and insertion of Foley catheter and bladder irrigation 9/3  Antibiotics: Had been on vancomycin and Zosyn. Has completed treatment for strep pneumoniae. Now being treated for aspiration pneumonia.  Unasyn initiated 9/11   Objective: Vital Signs  Filed Vitals:   04/10/15 0400 04/10/15 0508 04/10/15 0600 04/10/15 0800  BP: 137/93  125/79 142/90  Pulse: 106  97 90  Temp:  98.3 F (36.8 C)  97.9 F (36.6 C)  TempSrc:  Axillary  Axillary  Resp: 22  18 22   Height:      Weight:  93.4 kg (205 lb 14.6 oz)    SpO2: 98%  99% 98%    Intake/Output Summary (Last 24 hours) at 04/10/15 1224 Last data filed at 04/10/15 0800  Gross per 24 hour  Intake 2387.5 ml  Output   2000 ml  Net  387.5 ml   Filed Weights   04/08/15 0400 04/09/15 0500 04/10/15 0508  Weight: 92.4 kg (203 lb 11.3 oz) 93 kg (205 lb 0.4 oz) 93.4 kg (205 lb 14.6 oz)    General appearance: alert, distracted and no distress Resp: Diminished air entry at the bases. No crackles or wheezing. Cardio: regular rate and rhythm, S1, S2 normal, no murmur, click, rub or gallop GI: Covered with dressing. Tender to palpation. Bowel sounds sluggish. Neurologic: Alert. Distracted. Disoriented to time, place, person. No facial asymmetry. Able to move all his extremities without difficulties. No focal deficits identified.  Lab Results:  Basic Metabolic Panel:  Recent Labs Lab 04/05/15 0812 04/06/15 0530 04/07/15 0525 04/08/15 0530 04/09/15 0545 04/10/15 0315  NA  --  147* 144 142 142 142  K  --  3.0* 3.2* 3.4* 3.7 4.1  CL  --  106 107 108 111 110  CO2  --  32 28 26 24 25   GLUCOSE  --  116* 108* 131* 147* 107*  BUN  --  8 9 9 9 9   CREATININE  --  1.34* 1.26* 1.11 1.12 1.21  CALCIUM  --  8.6* 8.6* 8.8* 8.9 8.7*  MG 2.1  --   --  2.1  --   --    Liver Function Tests:  Recent Labs Lab 04/05/15 0510 04/06/15 0530   AST 39 38  ALT 46 46  ALKPHOS 58 54  BILITOT 1.0 1.1  PROT 5.5* 5.7*  ALBUMIN 2.0* 2.0*   CBC:  Recent Labs Lab 04/04/15 0430  04/07/15 0525 04/08/15 0530 04/09/15 1840 04/10/15 0315 04/10/15 1015  WBC 12.3*  < > 10.0 10.5 10.5 10.6* 10.5  NEUTROABS 10.0*  --   --   --   --   --  7.6  HGB 10.2*  < > 9.8* 10.7* 10.7* 10.2* 10.3*  HCT 30.8*  < > 31.4* 33.8* 33.8* 32.3* 32.9*  MCV 90.3  < > 94.3 94.2 94.9 95.3 95.6  PLT 344  < > 437* 473* 460* 457* 448*  < > = values in this interval not displayed.  CBG:  Recent Labs Lab 04/09/15 2011 04/10/15 0015 04/10/15 0506 04/10/15 0817 04/10/15 1207  GLUCAP 134* 99 133* 109* 122*    Recent Results (from the past 240 hour(s))  Culture, respiratory (NON-Expectorated)  Status: None   Collection Time: 04/01/15  8:56 AM  Result Value Ref Range Status   Specimen Description TRACHEAL ASPIRATE  Final   Special Requests NONE  Final   Gram Stain   Final    ABUNDANT WBC PRESENT,BOTH PMN AND MONONUCLEAR NO SQUAMOUS EPITHELIAL CELLS SEEN ABUNDANT GRAM POSITIVE COCCI IN PAIRS Performed at [facility]    Culture   Final    NO GROWTH 2 DAYS Performed at [facility]    Report Status 04/03/2015 FINAL  Final      Studies/Results: No results found.  Medications:  Scheduled: . ampicillin-sulbactam (UNASYN) IV  1.5 g Intravenous Q6H  . antiseptic oral rinse  7 mL Mouth Rinse q12n4p  . chlorhexidine  15 mL Mouth Rinse BID  . Influenza vac split quadrivalent PF  0.5 mL Intramuscular Tomorrow-1000  . insulin aspart  0-9 Units Subcutaneous 6 times per day  . pantoprazole (PROTONIX) IV  40 mg Intravenous Q12H  . sodium chloride  3 mL Intravenous Q12H  . THROMBI-PAD  1 each Topical Once  . valproate sodium  250 mg Intravenous Q12H   Continuous: . feeding supplement (VITAL AF 1.2 CAL)    . sodium chloride 0.45 % 1,000 mL with potassium chloride 20 mEq infusion 75 mL/hr at 04/10/15 0211   HUT:[id number]  **OR** acetaminophen, fentaNYL (SUBLIMAZE) injection, hydrALAZINE, ondansetron **OR** ondansetron (ZOFRAN) IV    Principal Problem:   Acute GI bleeding Active Problems:   HTN (hypertension)   Diabetes mellitus type 2, controlled   Dementia   Hiatal hernia   History of diverticulitis   History of prostate cancer   Acute blood loss anemia   Acute renal failure   GI bleeding   Hematochezia   Acute respiratory failure with hypoxia   History of diverticulosis   Aspiration pneumonia   Oropharyngeal dysphagia   Hypokalemia   Palliative care encounter   Dysphagia, pharyngoesophageal phase   Encephalopathy       LOS: 14 days   [facility] A  [facility] Pager [phone]  04/10/2015, 12:24 PM  If 7PM-7AM, please contact night-coverage at [email or web address], password [email or web address]

## 2015-04-10 NOTE — Progress Notes (Signed)
Patient ID: Jesse [name], male   DOB: 1940/09/19, 74 y.o.   MRN: [id number]     [address] [place], [place] [address]    Phone: [phone] FAX: [phone]     Subjective: Confused.  Lethargic, but arouses easily.  Reported bloody BMs, clots.  H&H are stable.   Objective:  Vital signs:  Filed Vitals:   04/10/15 0011 04/10/15 0400 04/10/15 0508 04/10/15 0600  BP:  137/93  125/79  Pulse:  106  97  Temp: 99.1 F (37.3 C)  98.3 F (36.8 C)   TempSrc: Axillary  Axillary   Resp:  22  18  Height:      Weight:   93.4 kg (205 lb 14.6 oz)   SpO2:  98%  99%    Last BM Date: 04/09/15  Intake/Output   Yesterday:  09/14 0701 - 09/15 0700 In: 3304.8 [I.V.:1833.8; GQ/QP:6195; IV Piggyback:305] Out: 1875 [Urine:1025; Stool:850] This shift: I/O last 3 completed shifts: In: 6309.8 [I.V.:3633.8; NG/GT:2066; IV Piggyback:610] Out: 2125 [Urine:1275; Stool:850]   Physical Exam: General: NAD. Abdomen: Soft.  Nondistended.  TTP around incision.  Incision with staples, wound edges are approximated.  RLQ ostomy is functioning.  Stoma is pink and viable.  No evidence of peritonitis.  No incarcerated hernias.    Problem List:   Principal Problem:   Acute GI bleeding Active Problems:   HTN (hypertension)   Diabetes mellitus type 2, controlled   Dementia   Hiatal hernia   History of diverticulitis   History of prostate cancer   Acute blood loss anemia   Acute renal failure   GI bleeding   Hematochezia   Acute respiratory failure with hypoxia   History of diverticulosis   Aspiration pneumonia   Oropharyngeal dysphagia   Hypokalemia   Palliative care encounter   Dysphagia, pharyngoesophageal phase   Encephalopathy    Results:   Labs: Results for orders placed or performed during the hospital encounter of 03/27/15 (from the past 48 hour(s))  Glucose, capillary     Status: Abnormal   Collection Time:  04/08/15  8:44 AM  Result Value Ref Range   Glucose-Capillary 134 (H) 65 - 99 mg/dL  Glucose, capillary     Status: Abnormal   Collection Time: 04/08/15 11:31 AM  Result Value Ref Range   Glucose-Capillary 115 (H) 65 - 99 mg/dL  Glucose, capillary     Status: Abnormal   Collection Time: 04/08/15  5:05 PM  Result Value Ref Range   Glucose-Capillary 141 (H) 65 - 99 mg/dL  Glucose, capillary     Status: Abnormal   Collection Time: 04/08/15  8:04 PM  Result Value Ref Range   Glucose-Capillary 108 (H) 65 - 99 mg/dL  Glucose, capillary     Status: Abnormal   Collection Time: 04/08/15 11:29 PM  Result Value Ref Range   Glucose-Capillary 122 (H) 65 - 99 mg/dL  Glucose, capillary     Status: Abnormal   Collection Time: 04/09/15  5:42 AM  Result Value Ref Range   Glucose-Capillary 162 (H) 65 - 99 mg/dL  Basic metabolic panel     Status: Abnormal   Collection Time: 04/09/15  5:45 AM  Result Value Ref Range   Sodium 142 135 - 145 mmol/L   Potassium 3.7 3.5 - 5.1 mmol/L   Chloride 111 101 - 111 mmol/L   CO2 24 22 - 32 mmol/L   Glucose, Bld 147 (H)  65 - 99 mg/dL   BUN 9 6 - 20 mg/dL   Creatinine, Ser 1.12 0.61 - 1.24 mg/dL   Calcium 8.9 8.9 - 10.3 mg/dL   GFR calc non Af Amer >60 >60 mL/min   GFR calc Af Amer >60 >60 mL/min    Comment: (NOTE) The eGFR has been calculated using the CKD EPI equation. This calculation has not been validated in all clinical situations. eGFR's persistently <60 mL/min signify possible Chronic Kidney Disease.    Anion gap 7 5 - 15  Glucose, capillary     Status: Abnormal   Collection Time: 04/09/15  7:33 AM  Result Value Ref Range   Glucose-Capillary 116 (H) 65 - 99 mg/dL   Comment 1 Notify RN    Comment 2 Document in Chart   Glucose, capillary     Status: Abnormal   Collection Time: 04/09/15  4:12 PM  Result Value Ref Range   Glucose-Capillary 119 (H) 65 - 99 mg/dL   Comment 1 Notify RN    Comment 2 Document in Chart   CBC     Status: Abnormal    Collection Time: 04/09/15  6:40 PM  Result Value Ref Range   WBC 10.5 4.0 - 10.5 K/uL   RBC 3.56 (L) 4.22 - 5.81 MIL/uL   Hemoglobin 10.7 (L) 13.0 - 17.0 g/dL   HCT 33.8 (L) 39.0 - 52.0 %   MCV 94.9 78.0 - 100.0 fL   MCH 30.1 26.0 - 34.0 pg   MCHC 31.7 30.0 - 36.0 g/dL   RDW 16.5 (H) 11.5 - 15.5 %   Platelets 460 (H) 150 - 400 K/uL  Glucose, capillary     Status: Abnormal   Collection Time: 04/09/15  8:11 PM  Result Value Ref Range   Glucose-Capillary 134 (H) 65 - 99 mg/dL  Glucose, capillary     Status: None   Collection Time: 04/10/15 12:15 AM  Result Value Ref Range   Glucose-Capillary 99 65 - 99 mg/dL  Basic metabolic panel     Status: Abnormal   Collection Time: 04/10/15  3:15 AM  Result Value Ref Range   Sodium 142 135 - 145 mmol/L   Potassium 4.1 3.5 - 5.1 mmol/L   Chloride 110 101 - 111 mmol/L   CO2 25 22 - 32 mmol/L   Glucose, Bld 107 (H) 65 - 99 mg/dL   BUN 9 6 - 20 mg/dL   Creatinine, Ser 1.21 0.61 - 1.24 mg/dL   Calcium 8.7 (L) 8.9 - 10.3 mg/dL   GFR calc non Af Amer 58 (L) >60 mL/min   GFR calc Af Amer >60 >60 mL/min    Comment: (NOTE) The eGFR has been calculated using the CKD EPI equation. This calculation has not been validated in all clinical situations. eGFR's persistently <60 mL/min signify possible Chronic Kidney Disease.    Anion gap 7 5 - 15  CBC     Status: Abnormal   Collection Time: 04/10/15  3:15 AM  Result Value Ref Range   WBC 10.6 (H) 4.0 - 10.5 K/uL   RBC 3.39 (L) 4.22 - 5.81 MIL/uL   Hemoglobin 10.2 (L) 13.0 - 17.0 g/dL   HCT 32.3 (L) 39.0 - 52.0 %   MCV 95.3 78.0 - 100.0 fL   MCH 30.1 26.0 - 34.0 pg   MCHC 31.6 30.0 - 36.0 g/dL   RDW 16.8 (H) 11.5 - 15.5 %   Platelets 457 (H) 150 - 400 K/uL  Glucose, capillary  Status: Abnormal   Collection Time: 04/10/15  5:06 AM  Result Value Ref Range   Glucose-Capillary 133 (H) 65 - 99 mg/dL    Imaging / Studies: No results found.  Medications / Allergies:  Scheduled Meds: .  ampicillin-sulbactam (UNASYN) IV  1.5 g Intravenous Q6H  . antiseptic oral rinse  7 mL Mouth Rinse q12n4p  . chlorhexidine  15 mL Mouth Rinse BID  . Influenza vac split quadrivalent PF  0.5 mL Intramuscular Tomorrow-1000  . insulin aspart  0-9 Units Subcutaneous 6 times per day  . pantoprazole (PROTONIX) IV  40 mg Intravenous Q12H  . sodium chloride  3 mL Intravenous Q12H  . THROMBI-PAD  1 each Topical Once  . valproate sodium  250 mg Intravenous Q12H   Continuous Infusions: . sodium chloride 0.45 % 1,000 mL with potassium chloride 20 mEq infusion 75 mL/hr at 04/10/15 0211   PRN Meds:.acetaminophen **OR** acetaminophen, fentaNYL (SUBLIMAZE) injection, hydrALAZINE, ondansetron **OR** ondansetron (ZOFRAN) IV  Antibiotics: Anti-infectives    Start     Dose/Rate Route Frequency Ordered Stop   04/06/15 1200  ampicillin-sulbactam (UNASYN) 1.5 g in sodium chloride 0.9 % 50 mL IVPB     1.5 g 100 mL/hr over 30 Minutes Intravenous Every 6 hours 04/06/15 1102     04/04/15 1200  piperacillin-tazobactam (ZOSYN) IVPB 3.375 g  Status:  Discontinued     3.375 g 12.5 mL/hr over 240 Minutes Intravenous Every 8 hours 04/04/15 1019 04/06/15 0809   04/04/15 1100  vancomycin (VANCOCIN) 1,250 mg in sodium chloride 0.9 % 250 mL IVPB  Status:  Discontinued     1,250 mg 166.7 mL/hr over 90 Minutes Intravenous Every 12 hours 04/04/15 1019 04/06/15 0809   04/02/15 1200  cefTRIAXone (ROCEPHIN) 2 g in dextrose 5 % 50 mL IVPB  Status:  Discontinued     2 g 100 mL/hr over 30 Minutes Intravenous Every 24 hours 04/02/15 0955 04/04/15 0922   03/31/15 2200  vancomycin (VANCOCIN) IVPB 750 mg/150 ml premix  Status:  Discontinued     750 mg 150 mL/hr over 60 Minutes Intravenous Every 12 hours 03/31/15 0929 04/01/15 0839   03/31/15 1000  vancomycin (VANCOCIN) 2,000 mg in sodium chloride 0.9 % 500 mL IVPB     2,000 mg 250 mL/hr over 120 Minutes Intravenous  Once 03/31/15 0927 03/31/15 1237   03/31/15 1000   piperacillin-tazobactam (ZOSYN) IVPB 3.375 g  Status:  Discontinued     3.375 g 12.5 mL/hr over 240 Minutes Intravenous Every 8 hours 03/31/15 0927 04/02/15 0955   03/29/15 1000  cefoTEtan (CEFOTAN) 2 g in dextrose 5 % 50 mL IVPB     2 g 100 mL/hr over 30 Minutes Intravenous To Surgery 03/29/15 0957 03/29/15 1012        Assessment/Plan Lower GI bleed POD#12 exploratory laparotomy with total colectomy---Dr. Zacarias Pontes is functioning.  TF are off now.  Will leave up to palliative care and primary service whether appropriate to resume in the interim.  I suspect the bleeding/clots is old blood as bowel are now functioning and h&h have remained stable.  Will leave staples in for a few more days.  The patient grimaces on exam, I have asked the nurse to provide pain medication. Will follow on PRN basis for now.    Erby Pian, [facility] Surgery Pager [phone]) For consults and floor pages call [phone])  04/10/2015 7:46 AM

## 2015-04-10 NOTE — Progress Notes (Signed)
Speech Language Pathology Treatment: Dysphagia  Patient Details Name: Jesse [name] MRN: [id number] DOB: [birth year] Today's Date: 04/10/2015 Time: 1115-1140 SLP Time Calculation (min) (ACUTE ONLY): 25 min  Assessment / Plan / Recommendation Clinical Impression  Patient seen for therapeutic po trials today to facilitate readiness for a po diet and/or repeat instrumental exam. Patient alert and cooperative, continues to require moderate-max verbal and tactile cues for sustained attention to task. Max initial cueing provided for carryout of self feeding. Patient then able to self feed remainder of po trials which assisted in facilitating timely oral transit of bolus today. Po trials continue to elicit inconsistent wet vocal quality and delayed cough concerning for airway compromise. Signs significantly decreased with pureed solids vs thin liquids (ice chips). Educated wife on aspiration risk and progress which patient has made today as compared to previous sessions. SLP hopeful that with improved mentation, we will see concomitant improvements in swallowing function and ability to protect airway. Will continue to f/u. Recommend repeat instrumental testing prior to any decisions regarding comfort feeds.     HPI Other Pertinent Information: Pt is a 74 yo male with a PMH of dementia, type 2 DM treated with oral agents, HTN. Hepatitis C positive, diverticulosis on colonoscopy, prostate cancer. Pt was admitted on 03/27/15 when the pt was found unresponsive and in shock (hemorhagic shock). MD suspected lower GIB likely diverticular, with hemmorhagic shock, pt had an emergent subtotal colectomy. CXR shows improving lower right lobe infiltrate. Pt was intubated 03/29/15 - 03/30/15, 03/30/15-04/02/15, currently NPO.    Pertinent Vitals Pain Assessment: No/denies pain Faces Pain Scale: No hurt  SLP Plan  Continue with current plan of care    Recommendations Diet recommendations: NPO Medication Administration: Via  alternative means             Oral Care Recommendations: Oral care QID Follow up Recommendations: Skilled Nursing facility;24 hour supervision/assistance Plan: Continue with current plan of care    [place], [place] [phone]   Anastasia Tompson Meryl 04/10/2015, 11:49 AM

## 2015-04-11 ENCOUNTER — Inpatient Hospital Stay (HOSPITAL_COMMUNITY): Payer: Medicare (Managed Care)

## 2015-04-11 DIAGNOSIS — F0391 Unspecified dementia with behavioral disturbance: Secondary | ICD-10-CM

## 2015-04-11 DIAGNOSIS — N170 Acute kidney failure with tubular necrosis: Secondary | ICD-10-CM

## 2015-04-11 LAB — CBC
HEMATOCRIT: 32.2 % — AB (ref 39.0–52.0)
HEMOGLOBIN: 10.1 g/dL — AB (ref 13.0–17.0)
MCH: 29.9 pg (ref 26.0–34.0)
MCHC: 31.4 g/dL (ref 30.0–36.0)
MCV: 95.3 fL (ref 78.0–100.0)
Platelets: 435 10*3/uL — ABNORMAL HIGH (ref 150–400)
RBC: 3.38 MIL/uL — AB (ref 4.22–5.81)
RDW: 16.4 % — ABNORMAL HIGH (ref 11.5–15.5)
WBC: 10 10*3/uL (ref 4.0–10.5)

## 2015-04-11 LAB — GLUCOSE, CAPILLARY
GLUCOSE-CAPILLARY: 85 mg/dL (ref 65–99)
GLUCOSE-CAPILLARY: 94 mg/dL (ref 65–99)
GLUCOSE-CAPILLARY: 98 mg/dL (ref 65–99)
Glucose-Capillary: 98 mg/dL (ref 65–99)
Glucose-Capillary: 89 mg/dL (ref 65–99)
Glucose-Capillary: 94 mg/dL (ref 65–99)
Glucose-Capillary: 98 mg/dL (ref 65–99)

## 2015-04-11 MED ORDER — IOHEXOL 300 MG/ML  SOLN
50.0000 mL | Freq: Once | INTRAMUSCULAR | Status: DC | PRN
  Administered 2015-04-11: 50 mL
  Filled 2015-04-11: qty 50

## 2015-04-11 MED ORDER — QUETIAPINE FUMARATE 25 MG PO TABS
50.0000 mg | ORAL_TABLET | Freq: Every day | ORAL | Status: DC
  Administered 2015-04-11 – 2015-04-13 (×3): 50 mg
  Filled 2015-04-11 (×3): qty 2

## 2015-04-11 MED ORDER — LIDOCAINE VISCOUS 2 % MT SOLN
OROMUCOSAL | Status: AC
Start: 2015-04-11 — End: 2015-04-11
  Filled 2015-04-11: qty 15

## 2015-04-11 NOTE — Clinical Social Work Note (Signed)
This CSW provided the current unit CSW a handoff. This CSW will sign off.   [place], MSW, [place]

## 2015-04-11 NOTE — Progress Notes (Signed)
Daily Progress Note   Patient Name: Jesse [name]       Date: 04/11/2015 DOB: Apr 20, 1941  Age: 74 y.o. MRN#: [id number] Attending Physician: Verlee Monte, MD Primary Care Physician: No primary care provider on file. Admit Date: 03/27/2015  Reason for Consultation/Follow-up: Establishing goals of care  Subjective: 74 year old African-American male with a past medical history of dementia, type 2 diabetes, essential hypertension, hepatitis C, and a known history of diverticulosis, presented with massive GI bleeding. This required emergent total colectomy. During the course of this hospitalization, patient has remained encephalopathic. He also was noted to have severe dysphagia. He is requiring tube feedings. Has been encephalopathic.  Interval Events: Met with patient and his wife today.  He reports that he continues to feel well.  Denies complaints. His wife is concerned by the fact that his tube was replaced, but he has not had his tube feeds restarted.    Length of Stay: 15 days  Current Medications: Scheduled Meds:  . ampicillin-sulbactam (UNASYN) IV  1.5 g Intravenous Q6H  . antiseptic oral rinse  7 mL Mouth Rinse q12n4p  . chlorhexidine  15 mL Mouth Rinse BID  . Influenza vac split quadrivalent PF  0.5 mL Intramuscular Tomorrow-1000  . insulin aspart  0-9 Units Subcutaneous 6 times per day  . lidocaine      . pantoprazole (PROTONIX) IV  40 mg Intravenous Q12H  . QUEtiapine  50 mg Per Tube QHS  . sodium chloride  3 mL Intravenous Q12H  . THROMBI-PAD  1 each Topical Once    Continuous Infusions: . feeding supplement (VITAL AF 1.2 CAL) Stopped (04/10/15 1900)  . sodium chloride 0.45 % 1,000 mL with potassium chloride 20 mEq infusion 75 mL/hr at 04/10/15 0211    PRN Meds: acetaminophen **OR** acetaminophen, fentaNYL (SUBLIMAZE) injection, hydrALAZINE, iohexol, ondansetron **OR** ondansetron (ZOFRAN) IV  Palliative Performance Scale: 30%     Vital Signs: BP 155/130 mmHg   Pulse 96  Temp(Src) 98.3 F (36.8 C) (Oral)  Resp 18  Ht _0  (1.803 m)  Wt 90.4 kg (199 lb 4.7 oz)  BMI 27.81 kg/m2  SpO2 99% SpO2: SpO2: 99 % O2 Device: O2 Device: Not Delivered O2 Flow Rate: O2 Flow Rate (L/min): 2.5 L/min  Intake/output summary:   Intake/Output Summary (Last 24 hours) at 04/11/15 1323 Last data filed at 04/11/15 1130  Gross per 24 hour  Intake    980 ml  Output    200 ml  Net    780 ml   LBM:   Baseline Weight: Weight: 98.431 kg (217 lb) Most recent weight: Weight: 90.4 kg (199 lb 4.7 oz)  Physical Exam: General: No distress, sitting up in bed HEENT: [place]/AT, NG feeding tube in place Resp: No labored breathing Extrem: Warm, dry Neuro: Confused but pleasant and conversational, at times joking with his wife            Additional Data Reviewed: Recent Labs     04/09/15  0545   04/10/15  0315   04/10/15  1800  04/11/15  0815  WBC   --    < >  10.6*   < >  13.5*  10.0  HGB   --    < >  10.2*   < >  10.7*  10.1*  PLT   --    < >  457*   < >  441*  435*  NA  142   --   142   --    --    --  BUN  9   --   9   --    --    --   CREATININE  1.12   --   1.21   --    --    --    < > = values in this interval not displayed.     Problem List:  Patient Active Problem List   Diagnosis Date Noted  . Palliative care encounter 04/09/2015  . Dysphagia, pharyngoesophageal phase 04/09/2015  . Encephalopathy   . Oropharyngeal dysphagia 04/07/2015  . Hypokalemia 04/07/2015  . Aspiration pneumonia   . Hematochezia   . Acute respiratory failure with hypoxia   . History of diverticulosis   . HTN (hypertension) 03/27/2015  . Diabetes mellitus type 2, controlled 03/27/2015  . Dementia 03/27/2015  . Acute GI bleeding 03/27/2015  . Hiatal hernia 03/27/2015  . History of diverticulitis 03/27/2015  . History of prostate cancer 03/27/2015  . Acute blood loss anemia 03/27/2015  . Acute renal failure 03/27/2015  . GI bleeding 03/27/2015     Palliative Care  Assessment & Plan    Code Status:  DNR  Goals of Care:  Patient with improving mental status over the past two days and was able to work more with speech therapy yesterday. His wife is hopeful that this may be a sign he is improving and would like to see how he continues to progress prior to making any decisions about long-term goals moving forward.   His wife is anxious to see how he does working with speech therapy today.  She requests that his tube feeds be restarted until he can be seen by SLP.  I asked his nurse to restart his tube feeds.  Symptom Management:  Pain: Appears well controlled today, and will continue same  Agitation: No signs of agitation on examination today. Recommend continue Seroquel   Psycho-social/Spiritual:  Desire for further Chaplaincy support:no   Prognosis: Unable to determine as waiting to see if his mental status and subsequently swallow function improved. He does remain at high risk for aspiration Discharge Planning: To be determined   Care plan was discussed with patient and his wife  Thank you for allowing the Palliative Medicine Team to assist in the care of this patient.   Time In: 1305 Time Out: 1335 Total Time 30 Prolonged Time Billed no    Greater than 50%  of this time was spent counseling and coordinating care related to the above assessment and plan.   Micheline Rough, MD  04/11/2015, 1:23 PM  Please contact Palliative Medicine Team phone at [phone] for questions and concerns.

## 2015-04-11 NOTE — Progress Notes (Addendum)
Nutrition Follow-up  DOCUMENTATION CODES:   Obesity unspecified  INTERVENTION:   Once NGT has been placed and placement is verified, resume:  Vital AF 1.2 @ 80 ml/hr via NGT to provide 2304 kcals, 144 gm protein, 1557 ml free water daily.  NUTRITION DIAGNOSIS:   Inadequate oral intake related to altered GI function as evidenced by NPO status.  Ongoing  GOAL:   Patient will meet greater than or equal to 90% of their needs  Met with TF  MONITOR:   TF tolerance, Weight trends, Labs  REASON FOR ASSESSMENT:   Consult Enteral/tube feeding initiation and management  ASSESSMENT:   Patient admitted on 9/1 with acute lower GI bleed with hemorrhagic shock.  S/P subtotal abdominal colectomy for massive LGI bleed on 9/3. Now with RLQ end ileostomy. 200 ml output 04/11/15 per doc flowsheets.   Spoke with RN, who reports pt was transferred to floor last night. Pt pulled out NGT last night; currently down to replace NGT. Pt was receiving Vital AF 1.2 @ 80 ml/hr via NGT, which provided 2304 kcals, 144 gm protein, 1557 ml free water daily (100% of estimated kcal and protein needs).   Palliative care following for GOC. Plan is continue TF via NGT, however, there are no plans for long term TF. SLP to re-evaluate to assess swallow function. Reviewed SLP note on 04/09/15, which recommended continued NPO status.   Labs reviewed.   Diet Order:  Diet NPO time specified  Skin:  Reviewed, no issues  Last BM:  04/11/15  Height:   Ht Readings from Last 1 Encounters:  04/07/15 5' 11"  (1.803 m)    Weight:   Wt Readings from Last 1 Encounters:  04/11/15 199 lb 4.7 oz (90.4 kg)    Ideal Body Weight:  78.18 kg  BMI:  Body mass index is 27.81 kg/(m^2).  Estimated Nutritional Needs:   Kcal:  2200-2400  Protein:  120-140 gm  Fluid:  2.2-2.4 L  EDUCATION NEEDS:   No education needs identified at this time  Jenifer A. Jimmye Norman, RD, LDN, CDE Pager: [phone] After hours Pager:  [phone]

## 2015-04-11 NOTE — Progress Notes (Addendum)
Called by NT stating the patient had pulled out his PANDA tube. When entering room wife was holding the tube saying she didn't see him do it but noticed it leaking. MD paged, will continue to monitor.

## 2015-04-11 NOTE — Progress Notes (Signed)
[facility] PROGRESS NOTE  Tarren Velardi BHA:[id number] DOB: 12-19-1940 DOA: 03/27/2015  PCP: No primary care provider on file.  Brief HPI: 74 year old African-American male with a past medical history of dementia, type 2 diabetes, essential hypertension, hepatitis C, and a known history of diverticulosis, presented with massive GI bleeding. This required emergent total colectomy. During the course of this hospitalization, patient has remained encephalopathic. He also was noted to have severe dysphagia. He is requiring tube feedings.  Past medical history:  Past Medical History  Diagnosis Date  . Memory loss   . Hypertension   . Diabetes mellitus without complication   . Diverticulosis     Of colon  . Hepatitis C     Virus has never been treated. No history of cirrhosis.  . Prostate cancer ~ 1990s.    Treated with radiation seed implant.  . Hiatal hernia     Does not require meds for reflux symptoms.    Subjective: Seen today with his wife at bedside, patient appears to be confused. Wife Mentioned that he had some confusion from dementia, but has no swallowing problems.  Assessment/Plan:   Aspiration pneumonia Patient was initially on vancomycin and Zosyn. He was transitioned to ceftriaxone on 9/8. There was increase in white count to 12 on 9/9 when he was changed back to vancomycin and Zosyn. WBC is improved. He remains afebrile. He is completed 7 days of treatment for strep pneumoniae identified on sputum culture.  Currently on Unasyn, continue.  Acute massive diverticular bleed, status post total colectomy General surgery is following. Pain medications as needed. This issue remains stable.  Ileostomy opening bled on 04/09/2015, no bleeding currently. On Protonix, Levaquin is discontinued, his hemoglobin appears to be very stable.  Dysphagia Seen by speech therapy and is nothing by mouth. Patient underwent modified barium swallow 9/10.  His dysphagia appears to be  oropharyngeal. He is noted to have a history of dementia, which could be contributing.  Does not have any focal neurological deficits. Continue with nasal feeding tube for now. Discussed with his wife regarding long-term plan. It may be reasonable to try nasal feeding to see if patient's general condition improves. But in all likelihood his dysphagia will persist. Patient's wife was taken aback by this opinion. She was explained regarding the diagnosis. She was told about the findings on MRI.  Palliative care met with the patient's wife and recommended to continue every feeding for now but no long-term tube feeding. SLP to reevaluate today, if no progress need to decide about PEG tube versus letting me with risk of aspiration.  Acute encephalopathy This is in the setting of baseline dementia. His mental status appears to be at baseline per his wife. Patient's dementia was initially diagnosed about 4-5 years ago. It appears to have progressed. Decline could have been rapid these last few weeks due to acute illness. His TSH, B-12 level was normal. RPR and HIV is nonreactive.  MRI brain shows chronic microvascular ischemia. No acute processes identified. Atrophy is noted.  I started him on low-dose of Seroquel, did very well.  Hypokalemia This is repleted intravenously.  Acute blood loss anemia Secondary to GI bleeding. Patient was transfused PRBC. Hemoglobin is stable  History of type 2 diabetes Continue monitoring CBGs. Continue sliding scale coverage.  History of essential hypertension Blood pressures noted to be elevated at times. Hydralazine as needed   DVT Prophylaxis: Lovenox    Code Status: Full code  Family Communication: Discussed with his wife in  detail. Disposition Plan: Remain in step down for now. Consultants: Gen. surgery. Palliative medicine.  Procedures:   Emergent exploratory laparotomy with total colectomy on 9/3.  EGD 9/3 which was normal with no source of GI  bleeding  Cystoscopy and insertion of Foley catheter and bladder irrigation 9/3  Antibiotics: Had been on vancomycin and Zosyn. Has completed treatment for strep pneumoniae. Now being treated for aspiration pneumonia.  Unasyn initiated 9/11   Objective: Vital Signs  Filed Vitals:   04/10/15 2006 04/10/15 2256 04/11/15 0500 04/11/15 0537  BP: 159/94 139/96  155/130  Pulse: 105 103  96  Temp: 98.7 F (37.1 C) 97.7 F (36.5 C)  98.3 F (36.8 C)  TempSrc: Axillary Oral  Oral  Resp: 25 20  18   Height:      Weight:   90.4 kg (199 lb 4.7 oz)   SpO2: 100% 96%  99%    Intake/Output Summary (Last 24 hours) at 04/11/15 1204 Last data filed at 04/11/15 0900  Gross per 24 hour  Intake    980 ml  Output      0 ml  Net    980 ml   Filed Weights   04/09/15 0500 04/10/15 0508 04/11/15 0500  Weight: 93 kg (205 lb 0.4 oz) 93.4 kg (205 lb 14.6 oz) 90.4 kg (199 lb 4.7 oz)    General appearance: alert, distracted and no distress Resp: Diminished air entry at the bases. No crackles or wheezing. Cardio: regular rate and rhythm, S1, S2 normal, no murmur, click, rub or gallop GI: Covered with dressing. Tender to palpation. Bowel sounds sluggish. Neurologic: Alert. Distracted. Disoriented to time, place, person. No facial asymmetry. Able to move all his extremities without difficulties. No focal deficits identified.  Lab Results:  Basic Metabolic Panel:  Recent Labs Lab 04/05/15 0812 04/06/15 0530 04/07/15 0525 04/08/15 0530 04/09/15 0545 04/10/15 0315  NA  --  147* 144 142 142 142  K  --  3.0* 3.2* 3.4* 3.7 4.1  CL  --  106 107 108 111 110  CO2  --  32 28 26 24 25   GLUCOSE  --  116* 108* 131* 147* 107*  BUN  --  8 9 9 9 9   CREATININE  --  1.34* 1.26* 1.11 1.12 1.21  CALCIUM  --  8.6* 8.6* 8.8* 8.9 8.7*  MG 2.1  --   --  2.1  --   --    Liver Function Tests:  Recent Labs Lab 04/05/15 0510 04/06/15 0530  AST 39 38  ALT 46 46  ALKPHOS 58 54  BILITOT 1.0 1.1  PROT 5.5*  5.7*  ALBUMIN 2.0* 2.0*   CBC:  Recent Labs Lab 04/09/15 1840 04/10/15 0315 04/10/15 1015 04/10/15 1800 04/11/15 0815  WBC 10.5 10.6* 10.5 13.5* 10.0  NEUTROABS  --   --  7.6  --   --   HGB 10.7* 10.2* 10.3* 10.7* 10.1*  HCT 33.8* 32.3* 32.9* 33.4* 32.2*  MCV 94.9 95.3 95.6 96.0 95.3  PLT 460* 457* 448* 441* 435*    CBG:  Recent Labs Lab 04/10/15 1524 04/10/15 2045 04/11/15 0007 04/11/15 0410 04/11/15 0754  GLUCAP 99 97 98 98 89    No results found for this or any previous visit (from the past 240 hour(s)).    Studies/Results: Dg Abd 1 View  04/11/2015   CLINICAL DATA:  Feeding tube placement  EXAM: ABDOMEN - 1 VIEW  COMPARISON:  04/07/2015  FINDINGS: Nasoenteric feeding tube has been  passed to the level of the ligament of Treitz. Contrast injection confirms patency and appropriate positioning.  IMPRESSION: 1. Nasoenteric feeding tube to ligament of Treitz.   Electronically Signed   By: Lucrezia Europe M.D.   On: 04/11/2015 10:20   Dg Loyce Dys Tube Plc W/fl-no Rad  04/11/2015   CLINICAL DATA:    NASO G TUBE PLACEMENT WITH FLUORO  Fluoroscopy was utilized by the requesting physician.  No radiographic  interpretation.     Medications:  Scheduled: . ampicillin-sulbactam (UNASYN) IV  1.5 g Intravenous Q6H  . antiseptic oral rinse  7 mL Mouth Rinse q12n4p  . chlorhexidine  15 mL Mouth Rinse BID  . Influenza vac split quadrivalent PF  0.5 mL Intramuscular Tomorrow-1000  . insulin aspart  0-9 Units Subcutaneous 6 times per day  . lidocaine      . pantoprazole (PROTONIX) IV  40 mg Intravenous Q12H  . QUEtiapine  25 mg Per Tube QHS  . sodium chloride  3 mL Intravenous Q12H  . THROMBI-PAD  1 each Topical Once  . valproate sodium  250 mg Intravenous Q12H   Continuous: . feeding supplement (VITAL AF 1.2 CAL) Stopped (04/10/15 1900)  . sodium chloride 0.45 % 1,000 mL with potassium chloride 20 mEq infusion 75 mL/hr at 04/10/15 0211   DHW:[id number] **OR** acetaminophen,  fentaNYL (SUBLIMAZE) injection, hydrALAZINE, iohexol, ondansetron **OR** ondansetron (ZOFRAN) IV    Principal Problem:   Acute GI bleeding Active Problems:   HTN (hypertension)   Diabetes mellitus type 2, controlled   Dementia   Hiatal hernia   History of diverticulitis   History of prostate cancer   Acute blood loss anemia   Acute renal failure   GI bleeding   Hematochezia   Acute respiratory failure with hypoxia   History of diverticulosis   Aspiration pneumonia   Oropharyngeal dysphagia   Hypokalemia   Palliative care encounter   Dysphagia, pharyngoesophageal phase   Encephalopathy       LOS: 15 days   [facility]  [facility] Pager [phone]  04/11/2015, 12:04 PM  If 7PM-7AM, please contact night-coverage at [email or web address], password [facility]

## 2015-04-11 NOTE — Progress Notes (Addendum)
Speech Language Pathology Treatment: Dysphagia  Patient Details Name: Jesse [name] MRN: [id number] DOB: Jul 16, 1941 Today's Date: 04/11/2015 Time: 8315-1761 SLP Time Calculation (min) (ACUTE ONLY): 14 min  Assessment / Plan / Recommendation Clinical Impression  Pt seen for po trials with set assist for self feeding with puree texture. Pt noted with wet vocal quality and chest congestion as SLP entered room. Intermittent wet quality throughout session and delayed cough x 1. Slight delay suspected with initiation of swallow. Sustained attention continues to be an issue but less cues required today to focus on task compared with previous sessions. Suspect no significant difference in swallow function, however recommend repeat MBS next date for possible improvements to assist with plan of care re: nutritional status. Wife present for last 1/3 of session and update on progress and recommendations.   HPI Other Pertinent Information: Pt is a 74 yo male with a PMH of dementia, type 2 DM treated with oral agents, HTN. Hepatitis C positive, diverticulosis on colonoscopy, prostate cancer. Pt was admitted on 03/27/15 when the pt was found unresponsive and in shock (hemorhagic shock). MD suspected lower GIB likely diverticular, with hemmorhagic shock, pt had an emergent subtotal colectomy. CXR shows improving lower right lobe infiltrate. Pt was intubated 03/29/15 - 03/30/15, 03/30/15-04/02/15, currently NPO.    Pertinent Vitals Pain Assessment: No/denies pain  SLP Plan  MBS;Continue with current plan of care    Recommendations Diet recommendations: NPO Medication Administration: Via alternative means              Oral Care Recommendations: Oral care QID Follow up Recommendations: Skilled Nursing facility;24 hour supervision/assistance Plan: MBS;Continue with current plan of care         Jesse [name] 04/11/2015, 3:12 PM  Jesse [name].Ed [employer] [phone]

## 2015-04-11 NOTE — Clinical Social Work Note (Signed)
Clinical Education officer, museum received handoff from [employer] CSW.  Patient currently in restraints with Panda tube feeds.  Per medical team, patient is pending a Palliative Care Consult to determine goals of care and discuss potential disposition options.  CSW remains available for support, however due to patient current medical status and insurance barriers, SNF placement is not appropriate at this time.  Barbette Or, [place]

## 2015-04-12 ENCOUNTER — Inpatient Hospital Stay (HOSPITAL_COMMUNITY): Payer: Medicare (Managed Care)

## 2015-04-12 DIAGNOSIS — I10 Essential (primary) hypertension: Secondary | ICD-10-CM

## 2015-04-12 LAB — BASIC METABOLIC PANEL
ANION GAP: 9 (ref 5–15)
BUN: 9 mg/dL (ref 6–20)
CALCIUM: 8.9 mg/dL (ref 8.9–10.3)
CO2: 23 mmol/L (ref 22–32)
CREATININE: 1.1 mg/dL (ref 0.61–1.24)
Chloride: 104 mmol/L (ref 101–111)
Glucose, Bld: 113 mg/dL — ABNORMAL HIGH (ref 65–99)
Potassium: 3.8 mmol/L (ref 3.5–5.1)
Sodium: 136 mmol/L (ref 135–145)

## 2015-04-12 LAB — CBC
HCT: 33.9 % — ABNORMAL LOW (ref 39.0–52.0)
HEMOGLOBIN: 11 g/dL — AB (ref 13.0–17.0)
MCH: 30.7 pg (ref 26.0–34.0)
MCHC: 32.4 g/dL (ref 30.0–36.0)
MCV: 94.7 fL (ref 78.0–100.0)
PLATELETS: 424 10*3/uL — AB (ref 150–400)
RBC: 3.58 MIL/uL — AB (ref 4.22–5.81)
RDW: 16.2 % — ABNORMAL HIGH (ref 11.5–15.5)
WBC: 9.2 10*3/uL (ref 4.0–10.5)

## 2015-04-12 LAB — GLUCOSE, CAPILLARY
GLUCOSE-CAPILLARY: 92 mg/dL (ref 65–99)
GLUCOSE-CAPILLARY: 112 mg/dL — AB (ref 65–99)
GLUCOSE-CAPILLARY: 121 mg/dL — AB (ref 65–99)
Glucose-Capillary: 132 mg/dL — ABNORMAL HIGH (ref 65–99)
Glucose-Capillary: 99 mg/dL (ref 65–99)

## 2015-04-12 NOTE — Progress Notes (Signed)
MBS report now available under imaging section of the notes.   Caralynn Gelber Sumney MA, CCC-SLP Acute Care Speech Language Pathologist    

## 2015-04-12 NOTE — Progress Notes (Signed)
[facility] PROGRESS NOTE  Jesse [name] PPI:[id number] DOB: 1941-06-20 DOA: 03/27/2015  PCP: No primary care Tranquilino Fischler on file.  Brief HPI: 74 year old African-American male with a past medical history of dementia, type 2 diabetes, essential hypertension, hepatitis C, and a known history of diverticulosis, presented with massive GI bleeding. This required emergent total colectomy. During the course of this hospitalization, patient has remained encephalopathic. He also was noted to have severe dysphagia. He is requiring tube feedings.  Past medical history:  Past Medical History  Diagnosis Date  . Memory loss   . Hypertension   . Diabetes mellitus without complication   . Diverticulosis     Of colon  . Hepatitis C     Virus has never been treated. No history of cirrhosis.  . Prostate cancer ~ 1990s.    Treated with radiation seed implant.  . Hiatal hernia     Does not require meds for reflux symptoms.    Subjective: Patient seen today with wife at bedside, confused, still mumbling. Going down for modified barium swallow.  Assessment/Plan:   Aspiration pneumonia Patient was initially on vancomycin and Zosyn. He was transitioned to ceftriaxone on 9/8. There was increase in white count to 12 on 9/9 when he was changed back to vancomycin and Zosyn. WBC is improved. He remains afebrile. He is completed 7 days of treatment for strep pneumoniae identified on sputum culture.  Currently on Unasyn, continue.  Acute massive diverticular bleed, status post total colectomy General surgery is following. Pain medications as needed. This issue remains stable.  Ileostomy opening bled on 04/09/2015, no bleeding currently. On Protonix, Levaquin is discontinued, his hemoglobin appears to be very stable.  Dysphagia Seen by speech therapy and is nothing by mouth. Patient underwent modified barium swallow 9/10.  His dysphagia appears to be oropharyngeal. He is noted to have a history of  dementia, which could be contributing.  Does not have any focal neurological deficits. Continue with nasal feeding tube for now. Discussed with his wife regarding long-term plan. It may be reasonable to try nasal feeding to see if patient's general condition improves. But in all likelihood his dysphagia will persist. Patient's wife was taken aback by this opinion. She was explained regarding the diagnosis. She was told about the findings on MRI.  Does not want long-term tube feeding. Modified barium swallow to be done again today and reevaluate.  Acute encephalopathy This is in the setting of baseline dementia. His mental status appears to be at baseline per his wife. Patient's dementia was initially diagnosed about 4-5 years ago. It appears to have progressed. Decline could have been rapid these last few weeks due to acute illness. His TSH, B-12 level was normal. RPR and HIV is nonreactive.  MRI brain shows chronic microvascular ischemia. No acute processes identified. Atrophy is noted.  I started him on low-dose of Seroquel, did very well.  Hypokalemia This is repleted intravenously.  Acute blood loss anemia Secondary to GI bleeding. Patient was transfused PRBC. Hemoglobin is stable  History of type 2 diabetes Continue monitoring CBGs. Continue sliding scale coverage.  History of essential hypertension Blood pressures noted to be elevated at times. Hydralazine as needed   DVT Prophylaxis: Lovenox    Code Status: Full code  Family Communication: Discussed with his wife in detail. Disposition Plan: Remain in step down for now. Consultants: Gen. surgery. Palliative medicine.  Procedures:   Emergent exploratory laparotomy with total colectomy on 9/3.  EGD 9/3 which was normal with no  source of GI bleeding  Cystoscopy and insertion of Foley catheter and bladder irrigation 9/3  Antibiotics: Had been on vancomycin and Zosyn. Has completed treatment for strep pneumoniae. Now being  treated for aspiration pneumonia.  Unasyn initiated 9/11   Objective: Vital Signs  Filed Vitals:   04/11/15 0537 04/11/15 1419 04/11/15 2102 04/12/15 0636  BP: 155/130 166/61 149/88 123/84  Pulse: 96 88 98 38  Temp: 98.3 F (36.8 C) 97.9 F (36.6 C) 98 F (36.7 C) 99.5 F (37.5 C)  TempSrc: Oral Oral Axillary Oral  Resp: 18   18  Height:      Weight:    93.5 kg (206 lb 2.1 oz)  SpO2: 99% 96% 98% 97%    Intake/Output Summary (Last 24 hours) at 04/12/15 1246 Last data filed at 04/11/15 1500  Gross per 24 hour  Intake      0 ml  Output    650 ml  Net   -650 ml   Filed Weights   04/10/15 0508 04/11/15 0500 04/12/15 0636  Weight: 93.4 kg (205 lb 14.6 oz) 90.4 kg (199 lb 4.7 oz) 93.5 kg (206 lb 2.1 oz)    General appearance: alert, distracted and no distress Resp: Diminished air entry at the bases. No crackles or wheezing. Cardio: regular rate and rhythm, S1, S2 normal, no murmur, click, rub or gallop GI: Covered with dressing. Tender to palpation. Bowel sounds sluggish. Neurologic: Alert. Distracted. Disoriented to time, place, person. No facial asymmetry. Able to move all his extremities without difficulties. No focal deficits identified.  Lab Results:  Basic Metabolic Panel:  Recent Labs Lab 04/07/15 0525 04/08/15 0530 04/09/15 0545 04/10/15 0315 04/12/15 0930  NA 144 142 142 142 136  K 3.2* 3.4* 3.7 4.1 3.8  CL 107 108 111 110 104  CO2 28 26 24 25 23   GLUCOSE 108* 131* 147* 107* 113*  BUN 9 9 9 9 9   CREATININE 1.26* 1.11 1.12 1.21 1.10  CALCIUM 8.6* 8.8* 8.9 8.7* 8.9  MG  --  2.1  --   --   --    Liver Function Tests:  Recent Labs Lab 04/06/15 0530  AST 38  ALT 46  ALKPHOS 54  BILITOT 1.1  PROT 5.7*  ALBUMIN 2.0*   CBC:  Recent Labs Lab 04/10/15 0315 04/10/15 1015 04/10/15 1800 04/11/15 0815 04/12/15 0930  WBC 10.6* 10.5 13.5* 10.0 9.2  NEUTROABS  --  7.6  --   --   --   HGB 10.2* 10.3* 10.7* 10.1* 11.0*  HCT 32.3* 32.9* 33.4* 32.2*  33.9*  MCV 95.3 95.6 96.0 95.3 94.7  PLT 457* 448* 441* 435* 424*    CBG:  Recent Labs Lab 04/11/15 1941 04/11/15 2348 04/12/15 0406 04/12/15 0758 04/12/15 1203  GLUCAP 94 85 92 112* 121*    No results found for this or any previous visit (from the past 240 hour(s)).    Studies/Results: Dg Abd 1 View  04/11/2015   CLINICAL DATA:  Feeding tube placement  EXAM: ABDOMEN - 1 VIEW  COMPARISON:  04/07/2015  FINDINGS: Nasoenteric feeding tube has been passed to the level of the ligament of Treitz. Contrast injection confirms patency and appropriate positioning.  IMPRESSION: 1. Nasoenteric feeding tube to ligament of Treitz.   Electronically Signed   By: Lucrezia Europe M.D.   On: 04/11/2015 10:20   Dg Abd Portable 1v  04/11/2015   CLINICAL DATA:  Feeding tube placement.  EXAM: PORTABLE ABDOMEN - 1 VIEW  COMPARISON:  04/11/2015  FINDINGS: The feeding tube tip is in the body region of the stomach.  IMPRESSION: The feeding tube tip is in the body region of the stomach.   Electronically Signed   By: Marijo Sanes M.D.   On: 04/11/2015 19:39   Dg Addison Bailey G Tube Plc W/fl-no Rad  04/11/2015   CLINICAL DATA:    NASO G TUBE PLACEMENT WITH FLUORO  Fluoroscopy was utilized by the requesting physician.  No radiographic  interpretation.     Medications:  Scheduled: . ampicillin-sulbactam (UNASYN) IV  1.5 g Intravenous Q6H  . antiseptic oral rinse  7 mL Mouth Rinse q12n4p  . chlorhexidine  15 mL Mouth Rinse BID  . Influenza vac split quadrivalent PF  0.5 mL Intramuscular Tomorrow-1000  . insulin aspart  0-9 Units Subcutaneous 6 times per day  . pantoprazole (PROTONIX) IV  40 mg Intravenous Q12H  . QUEtiapine  50 mg Per Tube QHS  . sodium chloride  3 mL Intravenous Q12H  . THROMBI-PAD  1 each Topical Once   Continuous: . feeding supplement (VITAL AF 1.2 CAL) Stopped (04/10/15 1900)  . sodium chloride 0.45 % 1,000 mL with potassium chloride 20 mEq infusion 75 mL/hr at 04/12/15 0548    HXT:[id number] **OR** acetaminophen, fentaNYL (SUBLIMAZE) injection, hydrALAZINE, iohexol, ondansetron **OR** ondansetron (ZOFRAN) IV    Principal Problem:   Acute GI bleeding Active Problems:   HTN (hypertension)   Diabetes mellitus type 2, controlled   Dementia   Hiatal hernia   History of diverticulitis   History of prostate cancer   Acute blood loss anemia   Acute renal failure   GI bleeding   Hematochezia   Acute respiratory failure with hypoxia   History of diverticulosis   Aspiration pneumonia   Oropharyngeal dysphagia   Hypokalemia   Palliative care encounter   Dysphagia, pharyngoesophageal phase   Encephalopathy       LOS: 16 days   [facility] A  [facility] Pager [phone]  04/12/2015, 12:46 PM  If 7PM-7AM, please contact night-coverage at [email or web address], password [facility]

## 2015-04-12 NOTE — Progress Notes (Signed)
Daily Progress Note   Patient Name: Jesse [name]       Date: 04/12/2015 DOB: 06/20/1941  Age: 74 y.o. MRN#: [id number] Attending Physician: Verlee Monte, MD Primary Care Physician: No primary care provider on file. Admit Date: 03/27/2015  Reason for Consultation/Follow-up: Establishing goals of care  Subjective: 74 year old African-American male with a past medical history of dementia, type 2 diabetes, essential hypertension, hepatitis C, and a known history of diverticulosis, presented with massive GI bleeding. This required emergent total colectomy. During the course of this hospitalization, patient has remained encephalopathic. He also was noted to have severe dysphagia. He is requiring tube feedings. Has been encephalopathic.  Interval Events: I met with Ms. Pittsley while her husband was down for swallow evaluation.  She was tearful and reports she is very nervous about the results of this test. She states that since they have come to [place] it is not what they expected as this was supposed to be "time to relax, celebrate and enjoy life."  I discussed with her what she thought different paths forward looked like. She is currently living with her daughter and reports she would not by able to take her husband home with her there. At the same time, she states that she has been looking for a place for them to live together. When I asked what was most important to her husband she reports 1) spending time together and 2) being outside the hospital.  I discussed with her that I thought the path moving forward regardless of the swallow evaluation were probably similar. If he is doing well with his swallowing, we would begin to feed him again, but he would still remain a high risk for aspiration. If he does not pass a swallow evaluation, we will still likely begin to feed him as he would not desire to have a long-term feeding tube. The only difference would be the level of risk for aspiration. She  would like to continue to work with speech therapy in order to minimize this risk is much as possible regardless of the outcome of the study.  If his swallowing has improved, he will likely need to go to SNF for rehabilitation prior to transitioning back to home. If his swallowing has not improved, he currently would not qualify for inpatient hospice and would likely still need to go to SNF for rehabilitation to get as strong as he can while she arranges for living arrangements at which time he did transition home with hospice support.   We discussed that if he is going to be at great risk for recurrent aspiration, his goal of not being at the hospital would likely best be served by home hospice support. She seemed to be in agreement with this.  - It seems to me the path forward in the immediate future will likely be similar regardless of outcome of MBS. His wife reports that she currently cannot care for him at her daughter's home. Based upon the fact that he is now more awake and will be able to tolerate some nutrition, I doubt that he will qualify for residential hospice (until aspiration event recurs).  Therefore, I think the path forward will likely be to SNF for rehabilitation with palliative care follow-up. If his swallowing is improved he would likely be able to transition back to home and continue with medical therapy. If his swallowing is not improved, he would likely transition home from the SNF with hospice support and a plan not to return  to the hospital when his aspiration recurs.  - We'll follow-up once results of swallow study are available.   Length of Stay: 16 days  Current Medications: Scheduled Meds:  . ampicillin-sulbactam (UNASYN) IV  1.5 g Intravenous Q6H  . antiseptic oral rinse  7 mL Mouth Rinse q12n4p  . chlorhexidine  15 mL Mouth Rinse BID  . Influenza vac split quadrivalent PF  0.5 mL Intramuscular Tomorrow-1000  . insulin aspart  0-9 Units Subcutaneous 6 times per day    . pantoprazole (PROTONIX) IV  40 mg Intravenous Q12H  . QUEtiapine  50 mg Per Tube QHS  . sodium chloride  3 mL Intravenous Q12H  . THROMBI-PAD  1 each Topical Once    Continuous Infusions: . feeding supplement (VITAL AF 1.2 CAL) Stopped (04/10/15 1900)  . sodium chloride 0.45 % 1,000 mL with potassium chloride 20 mEq infusion 75 mL/hr at 04/12/15 0548    PRN Meds: acetaminophen **OR** acetaminophen, fentaNYL (SUBLIMAZE) injection, hydrALAZINE, iohexol, ondansetron **OR** ondansetron (ZOFRAN) IV  Palliative Performance Scale: 30%     Vital Signs: BP 123/84 mmHg  Pulse 38  Temp(Src) 99.5 F (37.5 C) (Oral)  Resp 18  Ht 5' 11"  (1.803 m)  Wt 93.5 kg (206 lb 2.1 oz)  BMI 28.76 kg/m2  SpO2 97% SpO2: SpO2: 97 % O2 Device: O2 Device: Not Delivered O2 Flow Rate: O2 Flow Rate (L/min): 2.5 L/min  Intake/output summary:   Intake/Output Summary (Last 24 hours) at 04/12/15 1208 Last data filed at 04/11/15 1500  Gross per 24 hour  Intake      0 ml  Output    650 ml  Net   -650 ml   LBM:   Baseline Weight: Weight: 98.431 kg (217 lb) Most recent weight: Weight: 93.5 kg (206 lb 2.1 oz)             Additional Data Reviewed: Recent Labs     04/10/15  0315   04/11/15  0815  04/12/15  0930  WBC  10.6*   < >  10.0  9.2  HGB  10.2*   < >  10.1*  11.0*  PLT  457*   < >  435*  424*  NA  142   --    --   136  BUN  9   --    --   9  CREATININE  1.21   --    --   1.10   < > = values in this interval not displayed.     Problem List:  Patient Active Problem List   Diagnosis Date Noted  . Palliative care encounter 04/09/2015  . Dysphagia, pharyngoesophageal phase 04/09/2015  . Encephalopathy   . Oropharyngeal dysphagia 04/07/2015  . Hypokalemia 04/07/2015  . Aspiration pneumonia   . Hematochezia   . Acute respiratory failure with hypoxia   . History of diverticulosis   . HTN (hypertension) 03/27/2015  . Diabetes mellitus type 2, controlled 03/27/2015  . Dementia  03/27/2015  . Acute GI bleeding 03/27/2015  . Hiatal hernia 03/27/2015  . History of diverticulitis 03/27/2015  . History of prostate cancer 03/27/2015  . Acute blood loss anemia 03/27/2015  . Acute renal failure 03/27/2015  . GI bleeding 03/27/2015     Palliative Care Assessment & Plan    Code Status:  DNR  Goals of Care: MBS is pending. If his swallowing function improved, we'll likely end up transitioning to SNF and then to home at a future date with standard  medical care. If his swallowing has not improved, he will likely end up transitioning to SNF and then to home with hospice support. His wife does not seem to be interested rehospitalizing him at this is the case.  Symptom Management:  Pain: Appears well controlled today, and will continue same  Agitation: No signs of agitation on examination today. Recommend continue Seroquel   Psycho-social/Spiritual:  Desire for further Chaplaincy support:no   Prognosis: Unable to determine as waiting to see if his mental status and subsequently swallow function improved. He does remain at high risk for aspiration Discharge Planning: To be determined   Care plan was discussed with patient and his wife  Thank you for allowing the Palliative Medicine Team to assist in the care of this patient.   Time In: 1105 Time Out: 1135 Total Time 30 Prolonged Time Billed no    Greater than 50%  of this time was spent counseling and coordinating care related to the above assessment and plan.   Micheline Rough, MD  04/12/2015, 12:08 PM  Please contact Palliative Medicine Team phone at [phone] for questions and concerns.   ADDENDUM: I stopped by later once Mr. Shoemaker had returned to the room and he denies any complaints today. His wife reports that the results of the swallow study were positive and that they are working on getting him a tray. I asked that this meant that which be starting to consider options for rehabilitation and she did  affirm that this would likely be the case moving forward.  Physical Exam: General: No distress, sitting up in chair  HEENT: [place]/AT, NG feeding tube in place Resp: No labored breathing Extrem: Warm, dry Neuro: Confused but pleasant and conversational, at times joking with his wife   - MBS completed today: recommend conservative dypshagia 2 (chopped) diet and thin liquids - Most likely course forward will be for rehabilitation. He would still likely benefit from palliative follow-up at SNF. - I did discuss with Dr. Hartford Poli.  Micheline Rough, MD [place] Team [phone]

## 2015-04-13 DIAGNOSIS — Z452 Encounter for adjustment and management of vascular access device: Secondary | ICD-10-CM

## 2015-04-13 LAB — GLUCOSE, CAPILLARY
GLUCOSE-CAPILLARY: 95 mg/dL (ref 65–99)
GLUCOSE-CAPILLARY: 102 mg/dL — AB (ref 65–99)
GLUCOSE-CAPILLARY: 126 mg/dL — AB (ref 65–99)
Glucose-Capillary: 102 mg/dL — ABNORMAL HIGH (ref 65–99)
Glucose-Capillary: 107 mg/dL — ABNORMAL HIGH (ref 65–99)
Glucose-Capillary: 128 mg/dL — ABNORMAL HIGH (ref 65–99)

## 2015-04-13 MED ORDER — SODIUM CHLORIDE 0.9 % IJ SOLN
10.0000 mL | INTRAMUSCULAR | Status: DC | PRN
  Administered 2015-04-13: 40 mL

## 2015-04-13 NOTE — Progress Notes (Signed)
[facility] PROGRESS NOTE  Jesse [name] UUV:[id number] DOB: [birth year] DOA: 03/27/2015  PCP: No primary care provider on file.  Brief HPI: 74 year old African-American male with a past medical history of dementia, type 2 diabetes, essential hypertension, hepatitis C, and a known history of diverticulosis, presented with massive GI bleeding. This required emergent total colectomy. During the course of this hospitalization, patient has remained encephalopathic. He also was noted to have severe dysphagia. He is requiring tube feedings.  Past medical history:  Past Medical History  Diagnosis Date  . Memory loss   . Hypertension   . Diabetes mellitus without complication   . Diverticulosis     Of colon  . Hepatitis C     Virus has never been treated. No history of cirrhosis.  . Prostate cancer ~ 1990s.    Treated with radiation seed implant.  . Hiatal hernia     Does not require meds for reflux symptoms.    Subjective: Patient looks much better today, seen by SLP and started on dysphagia 2 diet yesterday. Discontinued nasal tube and restraints/mittens. Appropriate for discharge, SNF (per PT eval) versus home with home health services, CSW to evaluate.  Assessment/Plan:   Acute massive diverticular bleed, status post total colectomy Patient brought to the hospital after he was found in a pool of his own blood. Is evaluated by general surgery. Status post total colectomy Ileostomy opening bled on 04/09/2015, currently no bleeding. On Protonix.   Acute blood loss anemia, profound Patient has severe acute blood loss anemia secondary to acute GI bleed Had massive blood transfusion, total of 16 units of packed RBCs. Also has had transfusion of 3 units of reason platelets and 2 units of fresh frozen plasma. Currently hemoglobin stable  Dysphagia Seen by speech therapy and is nothing by mouth. Patient underwent modified barium swallow 9/10.  Per SLP his dysphagia appears to be  oropharyngeal, has history of dementia. Patient swelling improved after mental status improvement Modified barium swallow done on 04/12/2015 and SLP recommended dysphagia 2 with thin liquids.  Acute encephalopathy Patient has dementia, developed acute encephalopathy while is in the hospital. Likely acute prolonged delirium secondary to acute illness, narcotics, benzos and anticholinergic patient received the hospital. Has normal TSH, B12, RPR and HIV nonreactive. His MRI showed chronic microvascular ischemia. I started him on low-dose of Seroquel, doing very okay, per wife almost back to his baseline.  Aspiration pneumonia Patient was initially on vancomycin and Zosyn. He was transitioned to ceftriaxone on 9/8. There was increase in white count to 12 on 9/9 when he was changed back to vancomycin and Zosyn. WBC is improved. He remains afebrile. He is completed 7 days of treatment for strep pneumoniae identified on sputum culture.  Currently on Unasyn, continued. Patient will not need any antibiotics on discharge.  Hypokalemia This is repleted intravenously.  History of type 2 diabetes Continue monitoring CBGs. Continue sliding scale coverage.  History of essential hypertension Blood pressures noted to be elevated at times. Hydralazine as needed   DVT Prophylaxis: Lovenox    Code Status: Full code  Family Communication: Discussed with his wife in detail. Disposition Plan: Remain in step down for now. Consultants: Gen. surgery. Palliative medicine.  Procedures:   Emergent exploratory laparotomy with total colectomy on 9/3.  EGD 9/3 which was normal with no source of GI bleeding  Cystoscopy and insertion of Foley catheter and bladder irrigation 9/3  Antibiotics: Had been on vancomycin and Zosyn. Has completed treatment for strep pneumoniae. Now  being treated for aspiration pneumonia.  Unasyn initiated 9/11   Objective: Vital Signs  Filed Vitals:   04/12/15 0636 04/12/15  1500 04/12/15 2117 04/13/15 0523  BP: 123/84 131/85 117/83 118/80  Pulse: 38 102 126 103  Temp: 99.5 F (37.5 C) 98.3 F (36.8 C) 99 F (37.2 C) 98 F (36.7 C)  TempSrc: Oral Oral Oral Oral  Resp: 18 20 18 18   Height:      Weight: 93.5 kg (206 lb 2.1 oz)   94.5 kg (208 lb 5.4 oz)  SpO2: 97% 95% 96% 95%    Intake/Output Summary (Last 24 hours) at 04/13/15 1312 Last data filed at 04/13/15 1100  Gross per 24 hour  Intake    240 ml  Output   2350 ml  Net  -2110 ml   Filed Weights   04/11/15 0500 04/12/15 0636 04/13/15 0523  Weight: 90.4 kg (199 lb 4.7 oz) 93.5 kg (206 lb 2.1 oz) 94.5 kg (208 lb 5.4 oz)    General appearance: alert, distracted and no distress Resp: Diminished air entry at the bases. No crackles or wheezing. Cardio: regular rate and rhythm, S1, S2 normal, no murmur, click, rub or gallop GI: Covered with dressing. Tender to palpation. Bowel sounds sluggish. Neurologic: Alert. Distracted. Disoriented to time, place, person. No facial asymmetry. Able to move all his extremities without difficulties. No focal deficits identified.  Lab Results:  Basic Metabolic Panel:  Recent Labs Lab 04/07/15 0525 04/08/15 0530 04/09/15 0545 04/10/15 0315 04/12/15 0930  NA 144 142 142 142 136  K 3.2* 3.4* 3.7 4.1 3.8  CL 107 108 111 110 104  CO2 28 26 24 25 23   GLUCOSE 108* 131* 147* 107* 113*  BUN 9 9 9 9 9   CREATININE 1.26* 1.11 1.12 1.21 1.10  CALCIUM 8.6* 8.8* 8.9 8.7* 8.9  MG  --  2.1  --   --   --    Liver Function Tests: No results for input(s): AST, ALT, ALKPHOS, BILITOT, PROT, ALBUMIN in the last 168 hours. CBC:  Recent Labs Lab 04/10/15 0315 04/10/15 1015 04/10/15 1800 04/11/15 0815 04/12/15 0930  WBC 10.6* 10.5 13.5* 10.0 9.2  NEUTROABS  --  7.6  --   --   --   HGB 10.2* 10.3* 10.7* 10.1* 11.0*  HCT 32.3* 32.9* 33.4* 32.2* 33.9*  MCV 95.3 95.6 96.0 95.3 94.7  PLT 457* 448* 441* 435* 424*    CBG:  Recent Labs Lab 04/12/15 2042  04/13/15 0031 04/13/15 0422 04/13/15 0757 04/13/15 1231  GLUCAP 99 107* 95 102* 128*    No results found for this or any previous visit (from the past 240 hour(s)).    Studies/Results: Dg Abd Portable 1v  04/11/2015   CLINICAL DATA:  Feeding tube placement.  EXAM: PORTABLE ABDOMEN - 1 VIEW  COMPARISON:  04/11/2015  FINDINGS: The feeding tube tip is in the body region of the stomach.  IMPRESSION: The feeding tube tip is in the body region of the stomach.   Electronically Signed   By: Marijo Sanes M.D.   On: 04/11/2015 19:39   Dg Swallowing Func-speech Pathology  04/12/2015    Objective Swallowing Evaluation:    Patient Details  Name: Jesse [name] MRN: [id number] Date of Birth: January 10, 1941  Today's Date: 04/12/2015 Time: SLP Start Time (ACUTE ONLY): 1120-SLP Stop Time (ACUTE ONLY): 1143 SLP Time Calculation (min) (ACUTE ONLY): 23 min  Past Medical History:  Past Medical History  Diagnosis Date  . Memory loss   .  Hypertension   . Diabetes mellitus without complication   . Diverticulosis     Of colon  . Hepatitis C     Virus has never been treated. No history of cirrhosis.  . Prostate cancer ~ 1990s.    Treated with radiation seed implant.  . Hiatal hernia     Does not require meds for reflux symptoms.   Past Surgical History:  Past Surgical History  Procedure Laterality Date  . Laparoscopic cholecystectomy  ~ 2003  . Insertion prostate radiation seed  1990s  . Esophagogastroduodenoscopy N/A 03/29/2015    Procedure: ESOPHAGOGASTRODUODENOSCOPY (EGD);  Surgeon: Jerene Bears, MD;   [facility] END[facility]COPY;  Service: Endoscopy;  Laterality: N/A;  . Colectomy N/A 03/29/2015    Procedure: EXPLORATORY LAP WITH TOTAL COLECTOMY;  Surgeon: Ralene Ok, MD;  Location: [place];  Service: General;  Laterality: N/A;  . Cystoscopy N/A 03/29/2015    Procedure: CYSTOSCOPY WITH PLACEMENT OF [address].;   Surgeon: Ralene Ok, MD;  Location: [place];  Service: General;   Laterality: N/A;   HPI:  Other Pertinent  Information: Pt is a 74 yo male with a PMH of dementia,  type 2 DM treated with oral agents, HTN. Hepatitis C positive,  diverticulosis on colonoscopy, prostate cancer. Pt was admitted on 03/27/15  when the pt was found unresponsive and in shock (hemorhagic shock). MD  suspected lower GIB likely diverticular, with hemmorhagic shock, pt had an  emergent subtotal colectomy. CXR shows improving lower right lobe  infiltrate. Pt was intubated 03/29/15 - 03/30/15, 03/30/15-04/02/15, currently  NPO. Last MBS one week prior, 04-05-15 with NPO recommendations.    No Data Recorded  Assessment / Plan / Recommendation CHL IP CLINICAL IMPRESSIONS 04/12/2015  Therapy Diagnosis Mild to moderate pharyngeal phase dysphagia  Clinical Impression Pt with significant improvement in swallow function  and protection of airway since last MBS one week ago. This date presenting  with mild to moderate pharyngeal dysphagia. No penetration or aspiration  seen with thin liquids by teaspoon or large cup sips, however swallow  initiation was delayed to the level of the pyriform sinuses. Aspiration  only evidenced with use of thin liquids by straw which was sensed by the  patient. Vallecular and pyriform sinus residuals present secondary to  reduced tongue base retraction and decreased laryngeal elevation.  Additional reflexive swallows by the patient reduced residual amounts.  Pt  with functional mastication of solids, however given prior severity of  dypshagia history and decreased cognitive abilities recommend conservative  dypshagia 2 (chopped) diet and thin liquids. Whole barium tablet attempted  in puree, which was extracted from oral cavity due to difficulty with  propulsion throughout oropharynx. Recommend pills crushed with puree. FULL  supervision with all PO. NO straws. ST to follow up for diet tolerance.            CHL IP TREATMENT RECOMMENDATION 04/12/2015  Treatment Recommendations Therapy as outlined in treatment plan below     CHL IP DIET  RECOMMENDATION 04/12/2015  SLP Diet Recommendations Dysphagia 2 (Fine chop);Thin  Liquid Administration via (None)  Medication Administration Crushed with puree  Compensations Small sips/bites;Slow rate;Follow solids with liquid  Postural Changes and/or Swallow Maneuvers (None)     CHL IP OTHER RECOMMENDATIONS 04/12/2015  Recommended Consults (None)  Oral Care Recommendations Oral care BID  Other Recommendations (None)     CHL IP FOLLOW UP RECOMMENDATIONS 04/11/2015  Follow up Recommendations Skilled Nursing facility;24 hour  supervision/assistance  CHL IP FREQUENCY AND DURATION 04/12/2015  Speech Therapy Frequency (ACUTE ONLY) min 2x/week  Treatment Duration 1 week     Pertinent Vitals/Pain     SLP Swallow Goals No flowsheet data found.  No flowsheet data found.    CHL IP REASON FOR REFERRAL 04/12/2015  Reason for Referral Objectively evaluate swallowing function           [name] MA, CCC-SLP Acute Care Speech Language Pathologist       Levi Aland 04/12/2015, 12:51 PM     Medications:  Scheduled: . ampicillin-sulbactam (UNASYN) IV  1.5 g Intravenous Q6H  . antiseptic oral rinse  7 mL Mouth Rinse q12n4p  . chlorhexidine  15 mL Mouth Rinse BID  . Influenza vac split quadrivalent PF  0.5 mL Intramuscular Tomorrow-1000  . insulin aspart  0-9 Units Subcutaneous 6 times per day  . pantoprazole (PROTONIX) IV  40 mg Intravenous Q12H  . QUEtiapine  50 mg Per Tube QHS  . sodium chloride  3 mL Intravenous Q12H  . THROMBI-PAD  1 each Topical Once   Continuous: . sodium chloride 0.45 % 1,000 mL with potassium chloride 20 mEq infusion 75 mL/hr at 04/12/15 2307   PRX:[id number] **OR** acetaminophen, fentaNYL (SUBLIMAZE) injection, hydrALAZINE, iohexol, ondansetron **OR** ondansetron (ZOFRAN) IV    Principal Problem:   Acute GI bleeding Active Problems:   HTN (hypertension)   Diabetes mellitus type 2, controlled   Dementia   Hiatal hernia   History of diverticulitis   History of prostate  cancer   Acute blood loss anemia   Acute renal failure   GI bleeding   Hematochezia   Acute respiratory failure with hypoxia   History of diverticulosis   Aspiration pneumonia   Oropharyngeal dysphagia   Hypokalemia   Palliative care encounter   Dysphagia, pharyngoesophageal phase   Encephalopathy       LOS: 17 days   [facility]  [facility] Pager [phone]  04/13/2015, 1:12 PM  If 7PM-7AM, please contact night-coverage at [email or web address], password [facility]

## 2015-04-13 NOTE — Progress Notes (Signed)
Daily Progress Note   Patient Name: Jesse [name]       Date: 04/13/2015 DOB: May 16, 1941  Age: 74 y.o. MRN#: [id number] Attending Physician: Verlee Monte, MD Primary Care Physician: No primary care provider on file. Admit Date: 03/27/2015  Reason for Consultation/Follow-up: Establishing goals of care  Subjective: 74 year old African-American male with a past medical history of dementia, type 2 diabetes, essential hypertension, hepatitis C, and a known history of diverticulosis, presented with massive GI bleeding. This required emergent total colectomy. During the course of this hospitalization, patient has remained encephalopathic. He also was noted to have severe dysphagia.  Has been encephalopathic.  Interval Events:  I met with Mr. with this morning. He was sleeping on my arrival but aroused easily. He denies any complaints at this time. He does remain confused and unable to give any further history.  His wife was not present this morning during my encounter.   Length of Stay: 17 days  Current Medications: Scheduled Meds:  . ampicillin-sulbactam (UNASYN) IV  1.5 g Intravenous Q6H  . antiseptic oral rinse  7 mL Mouth Rinse q12n4p  . chlorhexidine  15 mL Mouth Rinse BID  . Influenza vac split quadrivalent PF  0.5 mL Intramuscular Tomorrow-1000  . insulin aspart  0-9 Units Subcutaneous 6 times per day  . pantoprazole (PROTONIX) IV  40 mg Intravenous Q12H  . QUEtiapine  50 mg Per Tube QHS  . sodium chloride  3 mL Intravenous Q12H  . THROMBI-PAD  1 each Topical Once    Continuous Infusions: . sodium chloride 0.45 % 1,000 mL with potassium chloride 20 mEq infusion 75 mL/hr at 04/12/15 2307    PRN Meds: acetaminophen **OR** acetaminophen, fentaNYL (SUBLIMAZE) injection, hydrALAZINE, iohexol, ondansetron **OR** ondansetron (ZOFRAN) IV  Palliative Performance Scale: 30%     Vital Signs: BP 118/80 mmHg  Pulse 103  Temp(Src) 98 F (36.7 C) (Oral)  Resp 18  Ht _0  (1.803 m)   Wt 94.5 kg (208 lb 5.4 oz)  BMI 29.07 kg/m2  SpO2 95% SpO2: SpO2: 95 % O2 Device: O2 Device: Not Delivered O2 Flow Rate: O2 Flow Rate (L/min): 2.5 L/min  Intake/output summary:   Intake/Output Summary (Last 24 hours) at 04/13/15 1005 Last data filed at 04/13/15 0524  Gross per 24 hour  Intake      0 ml  Output   1650 ml  Net  -1650 ml   LBM:   Baseline Weight: Weight: 98.431 kg (217 lb) Most recent weight: Weight: 94.5 kg (208 lb 5.4 oz) Physical Exam: General: No distress, sitting up in bed  HEENT: [place]/AT Resp: No labored breathing Extrem: Warm, dry Neuro:  sleeping but arouses easily, Confused but pleasant and conversational            Additional Data Reviewed: Recent Labs     04/11/15  0815  04/12/15  0930  WBC  10.0  9.2  HGB  10.1*  11.0*  PLT  435*  424*  NA   --   136  BUN   --   9  CREATININE   --   1.10     Problem List:  Patient Active Problem List   Diagnosis Date Noted  . Palliative care encounter 04/09/2015  . Dysphagia, pharyngoesophageal phase 04/09/2015  . Encephalopathy   . Oropharyngeal dysphagia 04/07/2015  . Hypokalemia 04/07/2015  . Aspiration pneumonia   . Hematochezia   . Acute respiratory failure with hypoxia   . History of diverticulosis   . HTN (  hypertension) 03/27/2015  . Diabetes mellitus type 2, controlled 03/27/2015  . Dementia 03/27/2015  . Acute GI bleeding 03/27/2015  . Hiatal hernia 03/27/2015  . History of diverticulitis 03/27/2015  . History of prostate cancer 03/27/2015  . Acute blood loss anemia 03/27/2015  . Acute renal failure 03/27/2015  . GI bleeding 03/27/2015     Palliative Care Assessment & Plan    Code Status:  DNR  Goals of Care:  - MBS completed: recommend conservative dypshagia 2 (chopped) diet and thin liquids  - When I spoke with his wife yesterday, plan moving forward was SNF for rehabilitation with palliative care follow-up. If he does well with rehabilitation, he will eventually be able  to transition home (once she finds a place for them here in town) and continue with medical therapy. If  he does not do well with rehabilitation, she would like to work to transition home from the SNF with hospice support and a plan not to return to the hospital when his aspiration recurs. If palliative follows at SNF, they will be able to assist his wife in determining which care plan is most appropriate based on how he does at rehab.  - I will continue to follow peripherally, but will not round on Mr. Herren on daily basis.  Please let us know if we can be of further assistance in the care of Mr. Garson at this time or at any point in the future.   Symptom Management:  Pain: Appears well controlled today, and will continue same  Agitation: No signs of agitation on examination today. Recommend continue Seroquel   Psycho-social/Spiritual:  Desire for further Chaplaincy support:no   Prognosis: Unable to determine as this will depend upon how he does with rehabilitation and aspiration recurrence. Discharge Planning: [facility] for rehab with Palliative care service follow-up   Care plan was discussed with patient  Thank you for allowing the Palliative Medicine Team to assist in the care of this patient.   Time In: 0945 Time Out: 1015 Total Time 30 Prolonged Time Billed no    Greater than 50%  of this time was spent counseling and coordinating care related to the above assessment and plan.   Micheline Rough, MD  04/13/2015, 10:05 AM  Please contact Palliative Medicine Team phone at [phone] for questions and concerns.

## 2015-04-14 ENCOUNTER — Telehealth: Payer: Self-pay

## 2015-04-14 DIAGNOSIS — K449 Diaphragmatic hernia without obstruction or gangrene: Secondary | ICD-10-CM

## 2015-04-14 LAB — GLUCOSE, CAPILLARY
GLUCOSE-CAPILLARY: 100 mg/dL — AB (ref 65–99)
GLUCOSE-CAPILLARY: 111 mg/dL — AB (ref 65–99)
GLUCOSE-CAPILLARY: 118 mg/dL — AB (ref 65–99)
Glucose-Capillary: 99 mg/dL (ref 65–99)
Glucose-Capillary: 96 mg/dL (ref 65–99)
Glucose-Capillary: 114 mg/dL — ABNORMAL HIGH (ref 65–99)
Glucose-Capillary: 106 mg/dL — ABNORMAL HIGH (ref 65–99)

## 2015-04-14 MED ORDER — QUETIAPINE FUMARATE 25 MG PO TABS
50.0000 mg | ORAL_TABLET | Freq: Every day | ORAL | Status: DC
  Administered 2015-04-14: 50 mg via ORAL
  Filled 2015-04-14: qty 2

## 2015-04-14 MED ORDER — ENSURE ENLIVE PO LIQD
237.0000 mL | Freq: Two times a day (BID) | ORAL | Status: DC
  Administered 2015-04-15 – 2015-04-16 (×2): 237 mL via ORAL

## 2015-04-14 NOTE — Progress Notes (Signed)
Spoke with wife and gave update of  patient behavior throughout the night. Order obtained and placed in 2 point soft wrist restraints due to escalating combative behavior. Alternative measures to restraints attempted first and were unsuccessful, such as redirection and re-orientation. Wife was agreeable to restraint as intervention due to patient current orientation and condition.

## 2015-04-14 NOTE — Care Management Important Message (Signed)
Important Message  Patient Details  Name: Jesse [name] MRN: [id number] Date of Birth: 1941-05-17   Medicare Important Message Given:  Yes-second notification given    Nathen May 04/14/2015, 4:55 PM

## 2015-04-14 NOTE — Progress Notes (Signed)
Wife at bedside, Case Manager Neoma Laming aware.

## 2015-04-14 NOTE — Progress Notes (Signed)
Speech Language Pathology Treatment: Dysphagia  Patient Details Name: Jesse [name] MRN: [id number] DOB: 1940/12/15 Today's Date: 04/14/2015 Time: 8264-1583 SLP Time Calculation (min) (ACUTE ONLY): 14 min  Assessment / Plan / Recommendation Clinical Impression  Skilled treatment session focused on addressing dysphagia goals.  Patient remains confused but cooperative.  SLP facilitated session with set-up of trials, which patient was able to self-feed with Max verbal and visual cues for portion control and pacing.  Patient with delayed cough x2 following thin liquid cup sips; SLP verbally re-educated patient on importance of small sips to prevent coughing.  The following trial patient was able to state that the sip was too big and demonstrated improved ability to self-monitor portion control throughout session with Mod cuing.  Patient consumed applesauce but declined further PO trials.  SLP created an external aid to assist with carryover of self-feeding and consistent cuing during PO intake.  SLP will continue to follow in hopes of liberalizing diet with improved mentation.       HPI Other Pertinent Information: Pt is a 74 yo male with a PMH of dementia, type 2 DM treated with oral agents, HTN. Hepatitis C positive, diverticulosis on colonoscopy, prostate cancer. Pt was admitted on 03/27/15 when the pt was found unresponsive and in shock (hemorhagic shock). MD suspected lower GIB likely diverticular, with hemmorhagic shock, pt had an emergent subtotal colectomy. CXR shows improving lower right lobe infiltrate. Pt was intubated 03/29/15 - 03/30/15, 03/30/15-04/02/15, currently NPO. Last MBS one week prior, 04-05-15 with NPO recommendations.     Pertinent Vitals Pain Assessment: No/denies pain  SLP Plan  Continue with current plan of care    Recommendations Diet recommendations: Dysphagia 2 (fine chop);Thin liquid Liquids provided via: Cup;No straw Medication Administration: Crushed with puree Supervision:  Patient able to self feed;Full supervision/cueing for compensatory strategies Compensations: Small sips/bites;Slow rate;Check for pocketing Postural Changes and/or Swallow Maneuvers: Seated upright 90 degrees              Oral Care Recommendations: Oral care BID Follow up Recommendations: Skilled Nursing facility;24 hour supervision/assistance Plan: Continue with current plan of care    GO    Carmelia Roller., CCC-SLP [phone]  [place] 04/14/2015, 9:19 AM

## 2015-04-14 NOTE — Care Management Note (Addendum)
Case Management Note  Patient Details  Name: Jesse [name] MRN: [id number] Date of Birth: [birth year]  Subjective/Objective:     Date: 04/14/15 Spoke with patient at the bedside along with wife. Introduced self as Tourist information centre manager and explained role in discharge planning and how to be reached. Verified patient lives in town,  With wife but are from [place].   Expressed no need for no other DME. Verified patient anticipates to go home with family, or SNF at time of discharge and will have full-time  supervision by family  at this time to best of their knowledge. Patient  denied needing help with their medication, but insurance benefit may be issue for medications.   Patient  is driven by wife to MD appointments.  Patient will have follow up at sickle cell clinic on 9/27 at 9 am- (overflow for CHW clinic).Patient is walking 350 feet, awaiting auth from snf, if do not get auth, plan is for patient to go to hotel with wife with [facility] services.  [facility] is checking to see if they are in network with insurance and [facility] as well and [place].  Neither one of the agencies are in network, insurance is [employer] ( this insurance is only in [place]) , will have to contact insurance tomorrow to see what can they do so patient can receive [facility] services here in [place].  Also wife states she will be getting a Art gallery manager.  CSW still has not heard regarding authorization for snf. Patient's insurance is a barrier at this time.       Plan: CM will continue to follow for discharge planning and [facility] resources.                Action/Plan:   Expected Discharge Date:                  Expected Discharge Plan:  Skilled Nursing Facility  In-House Referral:  Clinical Social Work  Discharge planning Services  CM Consult  Post Acute Care Choice:    Choice offered to:     DME Arranged:    DME Agency:     HH Arranged:    [place] Agency:     Status of Service:  Completed, signed off  Medicare Important Message Given:  Yes-fourth  notification given Date Medicare IM Given:    Medicare IM give by:    Date Additional Medicare IM Given:    Additional Medicare Important Message give by:     If discussed at [place] of Stay Meetings, dates discussed:    Additional Comments:  Zenon Mayo, RN 04/14/2015, 3:40 PM

## 2015-04-14 NOTE — Progress Notes (Signed)
Will remove patient from restraints post removal of central line to avoid possibility of patient pulling out the central line himself, this is based off of combative history per last night shift with staff, confusion/altered LOC, and pulling off ileostomy/catheter during this admission.

## 2015-04-14 NOTE — Progress Notes (Signed)
Spoke with patient's wife, whom states she is currently on the way to the hospital to discuss patient discharge plan.

## 2015-04-14 NOTE — Telephone Encounter (Signed)
Call received from Tomi Bamberger, [place] requesting a hospital follow up appointment for the patient.  An appointment was scheduled for 04/22/15 @ 0900 at the sickle cell clinic and the information was added to the  AVS.    An update was provided to D. Lovena Le, [place].

## 2015-04-14 NOTE — Progress Notes (Signed)
Physical Therapy Treatment Patient Details Name: Jesse [name] MRN: [id number] DOB: 02-20-1941 Today's Date: 04/14/2015    History of Present Illness Pt is a 74 y/o male with a PMH of dementia (wife reports sundowning), DMII, HTN, hep-C. Pt presents with GI bleed requiring emergent subtotal colectomy.     PT Comments    Pt ambulated 350' with min A to negotiate objects with RW. Pt may be able to DC home if his wife feels she can manage his care. He would benefit from being in a familiar environment.   Follow Up Recommendations  SNF;Other (comment);Home health PT (home if pt's wife feels she can manage pt, he would be most at ease in familiar environment)     Equipment Recommendations  Rolling walker with 5" wheels (RW to be determined before going home)    Recommendations for Other Services       Precautions / Restrictions Precautions Precautions: Fall Precaution Comments: Colostomy, NG tube, Abd incision. 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 Needed 6 people to restrain him at one point. Restrictions Weight Bearing Restrictions: No    Mobility  Bed Mobility Overal bed mobility: Needs Assistance Bed Mobility: Supine to Sit     Supine to sit: Min assist (up via R elbow)     General bed mobility comments: cues for guidance and direction  Transfers Overall transfer level: Needs assistance Equipment used: 1 person hand held assist   Sit to Stand: Min assist         General transfer comment: assist to come forward and postural cues  Ambulation/Gait   Ambulation Distance (Feet): 350 Feet Assistive device: Rolling walker (2 wheeled) Gait Pattern/deviations: Step-to pattern;Narrow base of support;Scissoring;Trunk flexed Gait velocity: slower   General Gait Details: pt unsteady without AD so used RW, frequent min A to negotiate obstacles with RW and frequent verbal cues for positioning in RW and posture   Stairs            Wheelchair Mobility    Modified Rankin (Stroke Patients Only)       Balance     Sitting balance-Leahy Scale: Fair       Standing balance-Leahy Scale: Fair                      Cognition Arousal/Alertness: Awake/alert Behavior During Therapy: WFL for tasks assessed/performed Overall Cognitive Status: History of cognitive impairments - at baseline                      Exercises      General Comments        Pertinent Vitals/Pain Pain Assessment: No/denies pain    Home Living                      Prior Function            PT Goals (current goals can now be found in the care plan section) Acute Rehab PT Goals Patient Stated Goal: Pt agreeable to get up to recliner. PT Goal Formulation: With patient Time For Goal Achievement: 04/16/15 Potential to Achieve Goals: Good Progress towards PT goals: Progressing toward goals    Frequency  Min 3X/week    PT Plan Current plan remains appropriate    Co-evaluation             End of Session Equipment Utilized During Treatment: Gait belt Activity Tolerance: Patient tolerated treatment well Patient left:  in chair;with call bell/phone within reach;with family/visitor present     Time: 1152-1216 PT Time Calculation (min) (ACUTE ONLY): 24 min  Charges:  $Gait Training: 8-22 mins $Therapeutic Activity: 8-22 mins                    G Codes:      Philomena Doheny 04/14/2015, 12:44 PM [phone]

## 2015-04-14 NOTE — Progress Notes (Signed)
Nutrition Follow-up  DOCUMENTATION CODES:   Obesity unspecified  INTERVENTION:   Ensure Enlive po BID, each supplement provides 350 kcal and 20 grams of protein  NUTRITION DIAGNOSIS:   Inadequate oral intake related to dysphagia as evidenced by meal completion < 50%.  Ongoing  GOAL:   Patient will meet greater than or equal to 90% of their needs  Progressing  MONITOR:   PO intake, Supplement acceptance, Labs, Weight trends, Skin, I & O's  REASON FOR ASSESSMENT:   Consult Enteral/tube feeding initiation and management  ASSESSMENT:   Patient admitted on 9/1 with acute lower GI bleed with hemorrhagic shock.  S/P subtotal abdominal colectomy for massive LGI bleed on 9/3. Now with RLQ end ileostomy. 200 ml output 04/14/15 per doc flowsheets.   Pt in with therapy at time of visit.   Pt underwent MBSS on 04/12/15, which revealed mild to moderate dysphagia, which has been a sginficant improvement in swallow function; pt was placed on a dysphagia 2 diet with nectar thick liquids and TF was subsequently d/c. Intake is variable; PO: 40-100%, per doc flowsheets.   Palliative care continues to follow for [place].   RNCM and CSW following. Plan to d/c back home with home health vs SNF once medically stable.   Labs reviewed: CBGS: 99-114.   Diet Order:  DIET DYS 2 Room service appropriate?: Yes; Fluid consistency:: Thin  Skin:  Reviewed, no issues  Last BM:  04/14/15  Height:   Ht Readings from Last 1 Encounters:  04/07/15 5\' 11"  (1.803 m)    Weight:   Wt Readings from Last 1 Encounters:  04/14/15 207 lb 10.8 oz (94.2 kg)    Ideal Body Weight:  78.18 kg  BMI:  Body mass index is 28.98 kg/(m^2).  Estimated Nutritional Needs:   Kcal:  2200-2400  Protein:  120-140 gm  Fluid:  2.2-2.4 L  EDUCATION NEEDS:   No education needs identified at this time  Jenifer A. Jimmye Norman, RD, LDN, CDE Pager: [phone] After hours Pager: [phone]

## 2015-04-14 NOTE — Progress Notes (Signed)
[facility] PROGRESS NOTE  Tsuneo Faison ZOX:[id number] DOB: [birth year] DOA: 03/27/2015  PCP: No primary care Constance Hackenberg on file.  Brief HPI: 74 year old African-American male with a past medical history of dementia, type 2 diabetes, essential hypertension, hepatitis C, and a known history of diverticulosis, presented with massive GI bleeding. This required emergent total colectomy. During the course of this hospitalization, patient has remained encephalopathic. He also was noted to have severe dysphagia. He is requiring tube feedings.  Past medical history:  Past Medical History  Diagnosis Date  . Memory loss   . Hypertension   . Diabetes mellitus without complication   . Diverticulosis     Of colon  . Hepatitis C     Virus has never been treated. No history of cirrhosis.  . Prostate cancer ~ 1990s.    Treated with radiation seed implant.  . Hiatal hernia     Does not require meds for reflux symptoms.    Subjective: Alert and awake, confused, today wife is not at bedside. Per social worker wife has to pay 50% of the SNF stay expenses or she has to take him home.   Assessment/Plan:   Acute massive diverticular bleed, status post total colectomy Patient brought to the hospital after he was found in a pool of his own blood. Is evaluated by general surgery. Status post total colectomy Ileostomy opening bled on 04/09/2015, currently no bleeding. On Protonix.   Acute blood loss anemia, profound Patient has severe acute blood loss anemia secondary to acute GI bleed Had massive blood transfusion, total of 16 units of packed RBCs. Also has had transfusion of 3 units of reason platelets and 2 units of fresh frozen plasma. Currently hemoglobin stable  Dysphagia Seen by speech therapy and is nothing by mouth. Patient underwent modified barium swallow 9/10.  Per SLP his dysphagia appears to be oropharyngeal, has history of dementia. Patient swelling improved after mental status  improvement Modified barium swallow done on 04/12/2015 and SLP recommended dysphagia 2 with thin liquids.  Acute encephalopathy Patient has dementia, developed acute encephalopathy while is in the hospital. Likely acute prolonged delirium secondary to acute illness, narcotics, benzos and anticholinergic patient received the hospital. Has normal TSH, B12, RPR and HIV nonreactive. His MRI showed chronic microvascular ischemia. I started him on low-dose of Seroquel, doing very okay, per wife almost back to his baseline.  Aspiration pneumonia Patient was initially on vancomycin and Zosyn. He was transitioned to ceftriaxone on 9/8. There was increase in white count to 12 on 9/9 when he was changed back to vancomycin and Zosyn. WBC is improved. He remains afebrile. He is completed 7 days of treatment for strep pneumoniae identified on sputum culture.  Currently on Unasyn, continued. Patient will not need any antibiotics on discharge.  Hypokalemia This is repleted intravenously.  History of type 2 diabetes Continue monitoring CBGs. Continue sliding scale coverage.  History of essential hypertension Blood pressures noted to be elevated at times. Hydralazine as needed   DVT Prophylaxis: Lovenox    Code Status: Full code  Family Communication: Discussed with his wife in detail. Disposition Plan: Remain in step down for now. Consultants: Gen. surgery. Palliative medicine.  Procedures:   Emergent exploratory laparotomy with total colectomy on 9/3. EGD 9/3 which was normal with no source of GI bleeding Cystoscopy and insertion of Foley catheter and bladder irrigation 9/3  Antibiotics: Had been on vancomycin and Zosyn. Has completed treatment for strep pneumoniae. Now being treated for aspiration pneumonia.  Unasyn  initiated 9/11   Objective: Vital Signs  Filed Vitals:   04/13/15 0523 04/13/15 1517 04/13/15 2104 04/14/15 0530  BP: 118/80 130/81 132/84 149/70  Pulse: 103 102 103 96    Temp: 98 F (36.7 C) 98 F (36.7 C) 98.2 F (36.8 C) 98 F (36.7 C)  TempSrc: Oral Oral Oral Axillary  Resp: 18 18 18 19   Height:      Weight: 94.5 kg (208 lb 5.4 oz)   94.2 kg (207 lb 10.8 oz)  SpO2: 95% 97% 97% 95%    Intake/Output Summary (Last 24 hours) at 04/14/15 1137 Last data filed at 04/13/15 1652  Gross per 24 hour  Intake     40 ml  Output    500 ml  Net   -460 ml   Filed Weights   04/12/15 0636 04/13/15 0523 04/14/15 0530  Weight: 93.5 kg (206 lb 2.1 oz) 94.5 kg (208 lb 5.4 oz) 94.2 kg (207 lb 10.8 oz)    General appearance: alert, distracted and no distress Resp: Diminished air entry at the bases. No crackles or wheezing. Cardio: regular rate and rhythm, S1, S2 normal, no murmur, click, rub or gallop GI: Covered with dressing. Tender to palpation. Bowel sounds sluggish. Neurologic: Alert. Distracted. Disoriented to time, place, person. No facial asymmetry. Able to move all his extremities without difficulties. No focal deficits identified.  Lab Results:  Basic Metabolic Panel:  Recent Labs Lab 04/08/15 0530 04/09/15 0545 04/10/15 0315 04/12/15 0930  NA 142 142 142 136  K 3.4* 3.7 4.1 3.8  CL 108 111 110 104  CO2 26 24 25 23   GLUCOSE 131* 147* 107* 113*  BUN 9 9 9 9   CREATININE 1.11 1.12 1.21 1.10  CALCIUM 8.8* 8.9 8.7* 8.9  MG 2.1  --   --   --    Liver Function Tests: No results for input(s): AST, ALT, ALKPHOS, BILITOT, PROT, ALBUMIN in the last 168 hours. CBC:  Recent Labs Lab 04/10/15 0315 04/10/15 1015 04/10/15 1800 04/11/15 0815 04/12/15 0930  WBC 10.6* 10.5 13.5* 10.0 9.2  NEUTROABS  --  7.6  --   --   --   HGB 10.2* 10.3* 10.7* 10.1* 11.0*  HCT 32.3* 32.9* 33.4* 32.2* 33.9*  MCV 95.3 95.6 96.0 95.3 94.7  PLT 457* 448* 441* 435* 424*    CBG:  Recent Labs Lab 04/13/15 2005 04/14/15 0005 04/14/15 0435 04/14/15 0527 04/14/15 0737  GLUCAP 126* 100* 111* 99 96    No results found for this or any previous visit (from the  past 240 hour(s)).    Studies/Results: Dg Swallowing Func-speech Pathology  04/12/2015    Objective Swallowing Evaluation:    Patient Details  Name: Jesse [name] MRN: [id number] Date of Birth: April 25, 1941  Today's Date: 04/12/2015 Time: SLP Start Time (ACUTE ONLY): 1120-SLP Stop Time (ACUTE ONLY): 1143 SLP Time Calculation (min) (ACUTE ONLY): 23 min  Past Medical History:  Past Medical History  Diagnosis Date  . Memory loss   . Hypertension   . Diabetes mellitus without complication   . Diverticulosis     Of colon  . Hepatitis C     Virus has never been treated. No history of cirrhosis.  . Prostate cancer ~ 1990s.    Treated with radiation seed implant.  . Hiatal hernia     Does not require meds for reflux symptoms.   Past Surgical History:  Past Surgical History  Procedure Laterality Date  . Laparoscopic cholecystectomy  ~ 2003  .  Insertion prostate radiation seed  1990s  . Esophagogastroduodenoscopy N/A 03/29/2015    Procedure: ESOPHAGOGASTRODUODENOSCOPY (EGD);  Surgeon: Jerene Bears, MD;   Location[facility] ENDOSCOPY;  Service: Endoscopy;  Laterality: N/A;  . Colectomy N/A 03/29/2015    Procedure: EXPLORATORY LAP WITH TOTAL COLECTOMY;  Surgeon: Ralene Ok, MD;  Location: [place];  Service: General;  Laterality: N/A;  . Cystoscopy N/A 03/29/2015    Procedure: CYSTOSCOPY WITH PLACEMENT OF [address]   Surgeon: Ralene Ok, MD;  Location: [place];  Service: General;   Laterality: N/A;   HPI:  Other Pertinent Information: Pt is a 74 yo male with a PMH of dementia,  type 2 DM treated with oral agents, HTN. Hepatitis C positive,  diverticulosis on colonoscopy, prostate cancer. Pt was admitted on 03/27/15  when the pt was found unresponsive and in shock (hemorhagic shock). MD  suspected lower GIB likely diverticular, with hemmorhagic shock, pt had an  emergent subtotal colectomy. CXR shows improving lower right lobe  infiltrate. Pt was intubated 03/29/15 - 03/30/15, 03/30/15-04/02/15, currently  NPO. Last MBS one week  prior, 04-05-15 with NPO recommendations.    No Data Recorded  Assessment / Plan / Recommendation CHL IP CLINICAL IMPRESSIONS 04/12/2015  Therapy Diagnosis Mild to moderate pharyngeal phase dysphagia  Clinical Impression Pt with significant improvement in swallow function  and protection of airway since last MBS one week ago. This date presenting  with mild to moderate pharyngeal dysphagia. No penetration or aspiration  seen with thin liquids by teaspoon or large cup sips, however swallow  initiation was delayed to the level of the pyriform sinuses. Aspiration  only evidenced with use of thin liquids by straw which was sensed by the  patient. Vallecular and pyriform sinus residuals present secondary to  reduced tongue base retraction and decreased laryngeal elevation.  Additional reflexive swallows by the patient reduced residual amounts.  Pt  with functional mastication of solids, however given prior severity of  dypshagia history and decreased cognitive abilities recommend conservative  dypshagia 2 (chopped) diet and thin liquids. Whole barium tablet attempted  in puree, which was extracted from oral cavity due to difficulty with  propulsion throughout oropharynx. Recommend pills crushed with puree. FULL  supervision with all PO. NO straws. ST to follow up for diet tolerance.            CHL IP TREATMENT RECOMMENDATION 04/12/2015  Treatment Recommendations Therapy as outlined in treatment plan below     CHL IP DIET RECOMMENDATION 04/12/2015  SLP Diet Recommendations Dysphagia 2 (Fine chop);Thin  Liquid Administration via (None)  Medication Administration Crushed with puree  Compensations Small sips/bites;Slow rate;Follow solids with liquid  Postural Changes and/or Swallow Maneuvers (None)     CHL IP OTHER RECOMMENDATIONS 04/12/2015  Recommended Consults (None)  Oral Care Recommendations Oral care BID  Other Recommendations (None)     CHL IP FOLLOW UP RECOMMENDATIONS 04/11/2015  Follow up Recommendations Skilled Nursing  facility;24 hour  supervision/assistance     CHL IP FREQUENCY AND DURATION 04/12/2015  Speech Therapy Frequency (ACUTE ONLY) min 2x/week  Treatment Duration 1 week     Pertinent Vitals/Pain     SLP Swallow Goals No flowsheet data found.  No flowsheet data found.    CHL IP REASON FOR REFERRAL 04/12/2015  Reason for Referral Objectively evaluate swallowing function           [name] MA, CCC-SLP Acute Care Speech Language Pathologist       [place], Vikki Ports  E 04/12/2015, 12:51 PM     Medications:  Scheduled: . antiseptic oral rinse  7 mL Mouth Rinse q12n4p  . chlorhexidine  15 mL Mouth Rinse BID  . Influenza vac split quadrivalent PF  0.5 mL Intramuscular Tomorrow-1000  . insulin aspart  0-9 Units Subcutaneous 6 times per day  . pantoprazole (PROTONIX) IV  40 mg Intravenous Q12H  . QUEtiapine  50 mg Oral QHS  . sodium chloride  3 mL Intravenous Q12H  . THROMBI-PAD  1 each Topical Once   Continuous:   IYM:EBRAXENMMHWKG **OR** acetaminophen, fentaNYL (SUBLIMAZE) injection, hydrALAZINE, iohexol, ondansetron **OR** ondansetron (ZOFRAN) IV, sodium chloride    Principal Problem:   Acute GI bleeding Active Problems:   HTN (hypertension)   Diabetes mellitus type 2, controlled   Dementia   Hiatal hernia   History of diverticulitis   History of prostate cancer   Acute blood loss anemia   Acute renal failure   GI bleeding   Hematochezia   Acute respiratory failure with hypoxia   History of diverticulosis   Aspiration pneumonia   Oropharyngeal dysphagia   Hypokalemia   Palliative care encounter   Dysphagia, pharyngoesophageal phase   Encephalopathy   Encounter for central line placement       LOS: 18 days   [place] [facility] Pager [phone]  04/14/2015, 11:37 AM  If 7PM-7AM, please contact night-coverage at [email or web address], password [email or web address]

## 2015-04-14 NOTE — Progress Notes (Addendum)
Spoke with wife (at bedside) and sister (over phone separately at length). The patient and his wife had just moved down here very recently and were looking for a place to rent right when the patient got sick. Currently the wife and the patient (before the patient was admitted) were living with the wife's daughter in [place].   The wife says the patient is "mostly fine when he is at home with her" however when she leaves the hospital he gets very agitated, and anxious.  The sister would like someone from case management or social work to call her Yehia Mcbain [phone] [place]), she has more information on the patient's history in [place], and the patient's current circumstance in [place]. Per the sister the patient is a veteran but does not receive care from [place] for his care in [place]. This information has been conveyed to [place] and Education officer, museum Evelena Peat (who has been at the bedside to see the patient, and is currently working on this).

## 2015-04-14 NOTE — Clinical Social Work Note (Signed)
Clinical Social Worker continuing to follow patient and family for support and discharge planning needs.  CSW spoke at length with patient wife regarding placement options for SNF vs. Home.  Patient wife states that they recently moved here from [place] and are living in the house with their daughter.  Patient wife states that patient would benefit from SNF placement due to lack of housing options at this moment and her inability to care for him in the home.  CSW working with [employer] to [employer] authorization, however patient wife made aware that with an insurance denial she will need to find an alternative option.  CM also involved in the event that insurance denies placement options.  CSW remains available for support and to facilitate patient discharge needs once medically stable (24 hours without restraints).  Barbette Or, [place]

## 2015-04-15 LAB — GLUCOSE, CAPILLARY
GLUCOSE-CAPILLARY: 107 mg/dL — AB (ref 65–99)
GLUCOSE-CAPILLARY: 93 mg/dL (ref 65–99)
GLUCOSE-CAPILLARY: 131 mg/dL — AB (ref 65–99)
GLUCOSE-CAPILLARY: 142 mg/dL — AB (ref 65–99)
Glucose-Capillary: 100 mg/dL — ABNORMAL HIGH (ref 65–99)
Glucose-Capillary: 128 mg/dL — ABNORMAL HIGH (ref 65–99)

## 2015-04-15 MED ORDER — QUETIAPINE FUMARATE 25 MG PO TABS
50.0000 mg | ORAL_TABLET | Freq: Every day | ORAL | Status: DC
  Administered 2015-04-15: 50 mg via ORAL
  Filled 2015-04-15: qty 2

## 2015-04-15 MED ORDER — HALOPERIDOL LACTATE 5 MG/ML IJ SOLN: 2.0000 mg | Freq: Four times a day (QID) | INTRAMUSCULAR | Status: DC | PRN

## 2015-04-15 MED ORDER — TRAZODONE HCL 50 MG PO TABS
25.0000 mg | ORAL_TABLET | Freq: Every day | ORAL | Status: DC
  Administered 2015-04-15: 25 mg via ORAL
  Filled 2015-04-15: qty 1

## 2015-04-15 MED ORDER — HALOPERIDOL LACTATE 5 MG/ML IJ SOLN
2.0000 mg | Freq: Four times a day (QID) | INTRAMUSCULAR | Status: DC | PRN
  Administered 2015-04-15: 2 mg via INTRAMUSCULAR
  Filled 2015-04-15: qty 1

## 2015-04-15 NOTE — Progress Notes (Signed)
Patient has remained out of restraints since 0830 this morning. Wife at the bedside at this time. Will continue to monitor.

## 2015-04-15 NOTE — Progress Notes (Signed)
Speech Language Pathology Treatment: Dysphagia  Patient Details Name: Jesse [name] MRN: [id number] DOB: 1941/01/31 Today's Date: 04/15/2015 Time: 1100-1113 SLP Time Calculation (min) (ACUTE ONLY): 13 min  Assessment / Plan / Recommendation Clinical Impression  Skilled treatment session focused on addressing dysphagia goals and education.  Patient upright in bed with wife at bedside.  Patient required set-up assist and Max faded to Mod multimodal cues to self-monitor and correct portion control and pacing during self-feeding.  Patient consumed regular textures with increased time and use of thin liquid washes to reduce oral residue.  Wife present and SLP educated her on results of objective assessment and cuing techniques.  Recommend diet upgrade to Dys.3 textures and continuation of thin liquids with full supervision.      HPI Other Pertinent Information: Pt is a 74 yo male with a PMH of dementia, type 2 DM treated with oral agents, HTN. Hepatitis C positive, diverticulosis on colonoscopy, prostate cancer. Pt was admitted on 03/27/15 when the pt was found unresponsive and in shock (hemorhagic shock). MD suspected lower GIB likely diverticular, with hemmorhagic shock, pt had an emergent subtotal colectomy. CXR shows improving lower right lobe infiltrate. Pt was intubated 03/29/15 - 03/30/15, 03/30/15-04/02/15, currently NPO. Last MBS one week prior, 04-05-15 with NPO recommendations.     Pertinent Vitals Pain Assessment: No/denies pain  SLP Plan  Continue with current plan of care    Recommendations Diet recommendations: Dysphagia 3 (mechanical soft);Thin liquid Liquids provided via: Cup;No straw Medication Administration: Crushed with puree Supervision: Patient able to self feed;Full supervision/cueing for compensatory strategies Compensations: Small sips/bites;Slow rate;Check for pocketing Postural Changes and/or Swallow Maneuvers: Seated upright 90 degrees              Follow up Recommendations:  Skilled Nursing facility;24 hour supervision/assistance Plan: Continue with current plan of care    GO    Carmelia Roller., CCC-SLP [phone]  [place] 04/15/2015, 11:19 AM

## 2015-04-15 NOTE — Progress Notes (Signed)
I met briefly with Jesse [name] this morning and spoke with his bedside nurse.    It appears there are no acute needs other than discharge planning.  Will therefore sign-off, but we would be happy to re-engage if we can be of further assistance.  Please let our team know if we can be of further assistance in the care of Jesse [name].   Micheline Rough, MD [place] Team [phone]

## 2015-04-15 NOTE — Progress Notes (Signed)
[facility] PROGRESS NOTE  Jesse [name] HAL:[id number] DOB: [birth year] DOA: 03/27/2015  PCP: No primary care provider on file.  Brief HPI: 74 year old African-American male with a past medical history of dementia, type 2 diabetes, essential hypertension, hepatitis C, and a known history of diverticulosis, presented with massive GI bleeding. This required emergent total colectomy. During the course of this hospitalization, patient has remained encephalopathic. He also was noted to have severe dysphagia. He is requiring tube feedings.  Past medical history:  Past Medical History  Diagnosis Date  . Memory loss   . Hypertension   . Diabetes mellitus without complication   . Diverticulosis     Of colon  . Hepatitis C     Virus has never been treated. No history of cirrhosis.  . Prostate cancer ~ 1990s.    Treated with radiation seed implant.  . Hiatal hernia     Does not require meds for reflux symptoms.    Subjective: Alert and awake, confused, seen with wife at bedside. Had some confusion last night. Awaiting bed for SNF.  Assessment/Plan:   Acute massive diverticular bleed, status post total colectomy Patient brought to the hospital after he was found in a pool of his own blood. Is evaluated by general surgery. Status post total colectomy Ileostomy opening bled on 04/09/2015, currently no bleeding. On Protonix.   Acute blood loss anemia, profound Patient has severe acute blood loss anemia secondary to acute GI bleed Had massive blood transfusion, total of 16 units of packed RBCs. Also has had transfusion of 3 units of reason platelets and 2 units of fresh frozen plasma. Currently hemoglobin stable  Dysphagia Seen by speech therapy and is nothing by mouth. Patient underwent modified barium swallow 9/10.  Per SLP his dysphagia appears to be oropharyngeal, has history of dementia. Patient swelling improved after mental status improvement Modified barium swallow done on  04/12/2015 and SLP recommended dysphagia 2 with thin liquids.  Acute encephalopathy Patient has dementia, developed acute encephalopathy while is in the hospital. Likely acute prolonged delirium secondary to acute illness, narcotics, benzos and anticholinergic patient received the hospital. Has normal TSH, B12, RPR and HIV nonreactive. His MRI showed chronic microvascular ischemia. I started him on low-dose of Seroquel, doing very okay, per wife he is back to his baseline during daytime. He still gets acute delirium and sundowning at night time, will add Risperdal daily at bedtime.  Aspiration pneumonia Patient was initially on vancomycin and Zosyn. He was transitioned to ceftriaxone on 9/8. There was increase in white count to 12 on 9/9 when he was changed back to vancomycin and Zosyn. WBC is improved. He remains afebrile. He is completed 7 days of treatment for strep pneumoniae identified on sputum culture.  Discontinued central line and IV antibiotics.  Hypokalemia This is repleted intravenously.  History of type 2 diabetes Continue monitoring CBGs. Continue sliding scale coverage.  History of essential hypertension Blood pressures noted to be elevated at times. Hydralazine as needed   DVT Prophylaxis: Lovenox    Code Status: Full code  Family Communication: Discussed with his wife in detail. Disposition Plan: Remain in step down for now. Consultants: Gen. surgery. Palliative medicine.  Procedures:   Emergent exploratory laparotomy with total colectomy on 9/3. EGD 9/3 which was normal with no source of GI bleeding Cystoscopy and insertion of Foley catheter and bladder irrigation 9/3  Antibiotics: Had been on vancomycin and Zosyn. Has completed treatment for strep pneumoniae. Now being treated for aspiration pneumonia.  Unasyn  initiated 9/11   Objective: Vital Signs  Filed Vitals:   04/14/15 2131 04/15/15 0543 04/15/15 0633 04/15/15 1408  BP: 144/96  130/93 116/79    Pulse: 105  100 111  Temp: 98.2 F (36.8 C)  97.9 F (36.6 C) 97.5 F (36.4 C)  TempSrc: Oral  Oral Oral  Resp: 18     Height:      Weight:  90.8 kg (200 lb 2.8 oz)    SpO2: 98%  95% 91%    Intake/Output Summary (Last 24 hours) at 04/15/15 1438 Last data filed at 04/15/15 1419  Gross per 24 hour  Intake    682 ml  Output   1950 ml  Net  -1268 ml   Filed Weights   04/13/15 0523 04/14/15 0530 04/15/15 0543  Weight: 94.5 kg (208 lb 5.4 oz) 94.2 kg (207 lb 10.8 oz) 90.8 kg (200 lb 2.8 oz)    General appearance: alert, distracted and no distress Resp: Diminished air entry at the bases. No crackles or wheezing. Cardio: regular rate and rhythm, S1, S2 normal, no murmur, click, rub or gallop GI: Covered with dressing. Tender to palpation. Bowel sounds sluggish. Neurologic: Alert. Distracted. Disoriented to time, place, person. No facial asymmetry. Able to move all his extremities without difficulties. No focal deficits identified.  Lab Results:  Basic Metabolic Panel:  Recent Labs Lab 04/09/15 0545 04/10/15 0315 04/12/15 0930  NA 142 142 136  K 3.7 4.1 3.8  CL 111 110 104  CO2 24 25 23   GLUCOSE 147* 107* 113*  BUN 9 9 9   CREATININE 1.12 1.21 1.10  CALCIUM 8.9 8.7* 8.9   Liver Function Tests: No results for input(s): AST, ALT, ALKPHOS, BILITOT, PROT, ALBUMIN in the last 168 hours. CBC:  Recent Labs Lab 04/10/15 0315 04/10/15 1015 04/10/15 1800 04/11/15 0815 04/12/15 0930  WBC 10.6* 10.5 13.5* 10.0 9.2  NEUTROABS  --  7.6  --   --   --   HGB 10.2* 10.3* 10.7* 10.1* 11.0*  HCT 32.3* 32.9* 33.4* 32.2* 33.9*  MCV 95.3 95.6 96.0 95.3 94.7  PLT 457* 448* 441* 435* 424*    CBG:  Recent Labs Lab 04/14/15 2057 04/15/15 0026 04/15/15 0416 04/15/15 0756 04/15/15 1150  GLUCAP 118* 100* 107* 93 128*    No results found for this or any previous visit (from the past 240 hour(s)).    Studies/Results: No results found.  Medications:  Scheduled: .  antiseptic oral rinse  7 mL Mouth Rinse q12n4p  . chlorhexidine  15 mL Mouth Rinse BID  . feeding supplement (ENSURE ENLIVE)  237 mL Oral BID BM  . Influenza vac split quadrivalent PF  0.5 mL Intramuscular Tomorrow-1000  . insulin aspart  0-9 Units Subcutaneous 6 times per day  . pantoprazole (PROTONIX) IV  40 mg Intravenous Q12H  . QUEtiapine  50 mg Oral QHS  . sodium chloride  3 mL Intravenous Q12H  . THROMBI-PAD  1 each Topical Once   Continuous:   ZWC:[id number] **OR** acetaminophen, fentaNYL (SUBLIMAZE) injection, hydrALAZINE, iohexol, ondansetron **OR** ondansetron (ZOFRAN) IV, sodium chloride    Principal Problem:   Acute GI bleeding Active Problems:   HTN (hypertension)   Diabetes mellitus type 2, controlled   Dementia   Hiatal hernia   History of diverticulitis   History of prostate cancer   Acute blood loss anemia   Acute renal failure   GI bleeding   Hematochezia   Acute respiratory failure with hypoxia   History of  diverticulosis   Aspiration pneumonia   Oropharyngeal dysphagia   Hypokalemia   Palliative care encounter   Dysphagia, pharyngoesophageal phase   Encephalopathy   Encounter for central line placement       LOS: 19 days   [place] [facility] Pager [phone]  04/15/2015, 2:38 PM  If 7PM-7AM, please contact night-coverage at [email or web address], password [email or web address]

## 2015-04-15 NOTE — Progress Notes (Signed)
Restraints were removed at 0830 this morning. Pt. Seems to be doing ok, calm at the moment. Pt. Is slightly anxious at times and asking for the presence of his wife. Bed alarm on, will continue to monitor.

## 2015-04-15 NOTE — Progress Notes (Signed)
Patient became very agitated and aggressive.  Was sitting at the nurses station.  Patient IV has been pulled out got and order to give haldol IM medication given with the aid of security.  Patient currently back in bed in 5w25

## 2015-04-16 LAB — GLUCOSE, CAPILLARY
GLUCOSE-CAPILLARY: 122 mg/dL — AB (ref 65–99)
GLUCOSE-CAPILLARY: 129 mg/dL — AB (ref 65–99)
GLUCOSE-CAPILLARY: 135 mg/dL — AB (ref 65–99)
GLUCOSE-CAPILLARY: 141 mg/dL — AB (ref 65–99)

## 2015-04-16 MED ORDER — TRAZODONE HCL 50 MG PO TABS: 25.0000 mg | ORAL_TABLET | Freq: Every day | ORAL | Status: AC

## 2015-04-16 MED ORDER — ENSURE ENLIVE PO LIQD: 237.0000 mL | Freq: Two times a day (BID) | ORAL | Status: AC

## 2015-04-16 MED ORDER — QUETIAPINE FUMARATE 50 MG PO TABS: 50.0000 mg | ORAL_TABLET | Freq: Every day | ORAL | Status: AC

## 2015-04-16 MED ORDER — LISINOPRIL 5 MG PO TABS: 5.0000 mg | ORAL_TABLET | Freq: Every day | ORAL | Status: AC

## 2015-04-16 NOTE — Discharge Planning (Signed)
Patient will discharge today per MD order. Patient will discharge to: [facility] RN to call report prior to transportation to: [phone] Transportation: [facility]- to be scheduled for 1:30pm  CSW sent discharge summary to SNF for review.  Packet is complete.  RN, patient and family aware of discharge plans.  Nonnie Done, [place] [phone]  Psychiatric & Orthopedics (5N 1-16) Clinical Social Worker

## 2015-04-16 NOTE — Progress Notes (Deleted)
Informed Dr [place] Pink of pt's PT and OT needs as well as wound care consult for right foot

## 2015-04-16 NOTE — Progress Notes (Signed)
Report called to S. Amalia Hailey, LPN at [facility] in [place].

## 2015-04-16 NOTE — Care Management Note (Signed)
Case Management Note  Patient Details  Name: Jesse [name] MRN: [id number] Date of Birth: [birth year]  Subjective/Objective:   Patient is for discharge today, Per [place] CSW patient will go to [facility].                   Action/Plan:   Expected Discharge Date:                  Expected Discharge Plan:  Skilled Nursing Facility  In-House Referral:  Clinical Social Work  Discharge planning Services  CM Consult  Post Acute Care Choice:    Choice offered to:     DME Arranged:    DME Agency:     HH Arranged:    [place] Agency:     Status of Service:  Completed, signed off  Medicare Important Message Given:  Yes-second notification given Date Medicare IM Given:    Medicare IM give by:    Date Additional Medicare IM Given:    Additional Medicare Important Message give by:     If discussed at [place] of Stay Meetings, dates discussed:    Additional Comments:  Zenon Mayo, RN 04/16/2015, 10:28 AM

## 2015-04-16 NOTE — Clinical Social Work Placement (Signed)
   CLINICAL SOCIAL WORK PLACEMENT  NOTE  Date:  04/16/2015  Patient Details  Name: Jesse [name] MRN: [id number] Date of Birth: 03-09-1941  Clinical Social Work is seeking post-discharge placement for this patient at the [place] level of care (*CSW will initial, date and re-position this form in  chart as items are completed):  Yes   Patient/family provided with [place] Work Department's list of facilities offering this level of care within the geographic area requested by the patient (or if unable, by the patient's family).  Yes   Patient/family informed of their freedom to choose among providers that offer the needed level of care, that participate in Medicare, Medicaid or managed care program needed by the patient, have an available bed and are willing to accept the patient.  Yes   Patient/family informed of [facility]'s ownership interest in [facility] and [facility], as well as of the fact that they are under no obligation to receive care at these facilities.  PASRR submitted to EDS on 04/04/15     PASRR number received on 04/04/15     Existing PASRR number confirmed on       FL2 transmitted to all facilities in geographic area requested by pt/family on       FL2 transmitted to all facilities within larger geographic area on 04/04/15     Patient informed that his/her managed care company has contracts with or will negotiate with certain facilities, including the following:        Yes   Patient/family informed of bed offers received.  Patient chooses bed at [facility]     Physician recommends and patient chooses bed at  (none)    Patient to be transferred to [facility] on 04/16/15.  Patient to be transferred to facility by [facility]     Patient family notified on 04/16/15 of transfer.  Name of family member notified:  wife, [name] (at bedside)     PHYSICIAN Please sign FL2,  Please prepare priority discharge summary, including medications     Additional Comment:    _______________________________________________ Dulcy Fanny, LCSW 04/16/2015, 11:35 AM

## 2015-04-16 NOTE — Progress Notes (Signed)
NURSING PROGRESS NOTE  Yaroslav Gombos [id number] Discharge Data: 04/16/2015 2:20 PM Attending Provider: No att. providers found PCP:No primary care provider on file.     [facility] to be D/C'd Nursing Home per MD order.   Last Vital Signs:  Blood pressure 153/91, pulse 109, temperature 97.5 F (36.4 C), temperature source Oral, resp. rate 16, height 5\' 11"  (1.803 m), weight 88.9 kg (195 lb 15.8 oz), SpO2 96 %.  Discharge Medication List   Medication List    STOP taking these medications        metFORMIN 500 MG tablet  Commonly known as:  GLUCOPHAGE      TAKE these medications        feeding supplement (ENSURE ENLIVE) Liqd  Take 237 mLs by mouth 2 (two) times daily between meals.     lisinopril 5 MG tablet  Commonly known as:  PRINIVIL,ZESTRIL  Take 1 tablet (5 mg total) by mouth daily.     QUEtiapine 50 MG tablet  Commonly known as:  SEROQUEL  Take 1 tablet (50 mg total) by mouth at bedtime.     traZODone 50 MG tablet  Commonly known as:  DESYREL  Take 0.5 tablets (25 mg total) by mouth at bedtime.         Doristine Devoid, RN

## 2015-04-16 NOTE — Discharge Summary (Signed)
Physician Discharge Summary  Fines Kimberlin VQM:[id number] DOB: 1941/03/31 DOA: 03/27/2015  PCP: No primary care provider on file.  Admit date: 03/27/2015 Discharge date: 04/16/2015  Time spent: 40 minutes  Recommendations for Outpatient Follow-up:  1. Follow up with nursing home M.D.  Discharge Diagnoses:  Principal Problem:   Acute GI bleeding Active Problems:   HTN (hypertension)   Diabetes mellitus type 2, controlled   Dementia   Hiatal hernia   History of diverticulitis   History of prostate cancer   Acute blood loss anemia   Acute renal failure   GI bleeding   Hematochezia   Acute respiratory failure with hypoxia   History of diverticulosis   Aspiration pneumonia   Oropharyngeal dysphagia   Hypokalemia   Palliative care encounter   Dysphagia, pharyngoesophageal phase   Encephalopathy   Encounter for central line placement   Discharge Condition: Stable  Diet recommendation: Dysphagia 2 with thin liquids  Filed Weights   04/14/15 0530 04/15/15 0543 04/16/15 0510  Weight: 94.2 kg (207 lb 10.8 oz) 90.8 kg (200 lb 2.8 oz) 88.9 kg (195 lb 15.8 oz)    History of present illness:  This is a 74 year old male patient visiting from [place] with this wife who has a past medical history of hypertension, diabetes mellitus not on insulin, dementia, diverticulitis, had a hernia and prostate cancer. He presented to the ER with reports of bloody stools for 3 days. He has not had any abdominal pain with these symptoms. He did report on Tuesday evening prior to the onset of the first bloody stool he was having epigastric discomfort and severe indigestion. He has not been taking any NSAIDs. His last colonoscopy was greater than 10 years ago and the wife reports he is due in 2016. Patient has not had any shortness of breath, chest pain or palpitations. The wife has noted the patient to be more weak and the patient has also reported weakness and fatigue. He has not had any constitutional  symptoms such as fevers chills and has not had any sick contacts. No emesis.  When the patient presented to triage his blood pressure was 98/62 with a pulse rate of 120 and he was afebrile. His blood pressure did decrease to as low as 78/55 and he was subsequently given 2 L IV fluid by the ER physician with follow-up blood pressure up to 97/65. Initial hemoglobin 9.7 with baseline unknown but given presentation with symptomatic anemia as evidenced by hypotension it is suspected this is not patient's baseline and 2 units of packed red cells up in order by the EDP. BUN was noted to be 31 with a creatinine of 1.5 for again baseline unknown, glucose 143, white count slightly elevated at 11,300, platelet counts normal 205,000, PT 14.7 and INR 1.13, PTT 21. EKG unremarkable with sinus tachycardia rate 126 bpm, QTC 460 ms, no ST segment changes or T-wave inversions that would be concerning for ischemia. On-call gastroenterology has been consulted by the EDP.   Hospital Course:  Mr. Dearmas had prolonged and rather complicated hospital stay, patient admitted to [facility] on 03/27/2015 and discharged on 04/16/2015. He was admitted initially for hypotension, hemorrhagic shock with unknown source of bleeding. Patient underwent EGD which showed no source of bleeding, admitted to the ICU and had exploratory laparotomy with total colectomy and ileostomy placement. Patient after that developed after mental status, aspiration pneumonia, acute respiratory failure. After he was recovering he developed agitation during nighttime likely to sundowning and settings of  dementia.  Acute massive diverticular bleed, status post total colectomy Patient brought to the hospital after he was found in a pool of his own blood. Is evaluated by general surgery. Status post total colectomy Ileostomy opening bled on 04/09/2015, currently no bleeding. On Protonix.   Acute blood loss anemia, profound Patient has severe acute blood  loss anemia secondary to acute GI bleed Had massive blood transfusion, total of 16 units of packed RBCs. Also has had transfusion of 3 units of reason platelets and 2 units of fresh frozen plasma. Currently hemoglobin stable  Dysphagia Seen by speech therapy and is nothing by mouth. Patient underwent modified barium swallow 9/10.  Per SLP his dysphagia appears to be oropharyngeal, has history of dementia. Patient swelling improved after mental status improvement Modified barium swallow done on 04/12/2015 and SLP recommended dysphagia 2 with thin liquids.  Acute encephalopathy/acute delirium in settings of dementia Patient has dementia, developed acute encephalopathy while is in the hospital. Likely acute prolonged delirium secondary to acute illness, narcotics, benzos and anticholinergic patient received the hospital. Has normal TSH, B12, RPR and HIV nonreactive. His MRI showed chronic microvascular ischemia. Patient started on Seroquel 25 mg increase to 50 mg. Discharge on 50 mg daily at bedtime. FDA black box warning discussed with the wife, and had the typical psychotic and increased CV death in elderly with dementia. Patient also placed on trazodone to help with sleep at night.  Aspiration pneumonia Patient was initially on vancomycin and Zosyn. He was transitioned to ceftriaxone on 9/8. There was increase in white count to 12 on 9/9 when he was changed back to vancomycin and Zosyn. WBC is improved. He remains afebrile. He is completed 7 days of treatment for strep pneumoniae identified on sputum culture.  Discontinued central line and IV antibiotics.  Hypokalemia This is repleted intravenously.  History of type 2 diabetes Continue monitoring CBGs. His hemoglobin A1c is 6.1, I discontinued metformin on discharge. Always can be restarted if CBGs are high.  History of essential hypertension Blood pressures was reasonable during this hospital stay, on discharge lisinopril dose  decreased from 20 mg to 5 mg.   Procedures: Emergent exploratory laparotomy with total colectomy on 9/3. EGD 9/3 which was normal with no source of GI bleeding Cystoscopy and insertion of Foley catheter and bladder irrigation 9/3  Consultations:  Gen. surgery.  Palliative medicine  Discharge Exam: Filed Vitals:   04/16/15 0506  BP: 153/91  Pulse: 109  Temp: 97.5 F (36.4 C)  Resp: 16   General: Alert and awake, oriented x3, not in any acute distress. HEENT: anicteric sclera, pupils reactive to light and accommodation, EOMI CVS: S1-S2 clear, no murmur rubs or gallops Chest: clear to auscultation bilaterally, no wheezing, rales or rhonchi Abdomen: soft nontender, nondistended, normal bowel sounds, no organomegaly Extremities: no cyanosis, clubbing or edema noted bilaterally Neuro: Cranial nerves II-XII intact, no focal neurological deficits  Discharge Instructions   Discharge Instructions    Diet - low sodium heart healthy    Complete by:  As directed      Increase activity slowly    Complete by:  As directed           Current Discharge Medication List    START taking these medications   Details  feeding supplement, ENSURE ENLIVE, (ENSURE ENLIVE) LIQD Take 237 mLs by mouth 2 (two) times daily between meals. Qty: 237 mL, Refills: 12    QUEtiapine (SEROQUEL) 50 MG tablet Take 1 tablet (50 mg total) by  mouth at bedtime.    traZODone (DESYREL) 50 MG tablet Take 0.5 tablets (25 mg total) by mouth at bedtime.      CONTINUE these medications which have CHANGED   Details  lisinopril (PRINIVIL,ZESTRIL) 5 MG tablet Take 1 tablet (5 mg total) by mouth daily.      STOP taking these medications     metFORMIN (GLUCOPHAGE) 500 MG tablet        No Known Allergies Follow-up Information    Follow up with [facility] on 04/22/2015.   Specialty:  Internal Medicine   Why:  at 9:00am for a hospital follow up appointment    Contact information:    [place] [phone]       The results of significant diagnostics from this hospitalization (including imaging, microbiology, ancillary and laboratory) are listed below for reference.    Significant Diagnostic Studies: Dg Abd 1 View  04/11/2015   CLINICAL DATA:  Feeding tube placement  EXAM: ABDOMEN - 1 VIEW  COMPARISON:  04/07/2015  FINDINGS: Nasoenteric feeding tube has been passed to the level of the ligament of Treitz. Contrast injection confirms patency and appropriate positioning.  IMPRESSION: 1. Nasoenteric feeding tube to ligament of Treitz.   Electronically Signed   By: Lucrezia Europe M.D.   On: 04/11/2015 10:20   Mr Brain Wo Contrast  04/06/2015   CLINICAL DATA:  Encephalopathy.  Altered mental status.  Delirium.  EXAM: MRI HEAD WITHOUT CONTRAST  TECHNIQUE: Multiplanar, multiecho pulse sequences of the brain and surrounding structures were obtained without intravenous contrast.  COMPARISON:  None.  FINDINGS: Moderate generalized atrophy and white matter disease is present bilaterally. Dilated perivascular spaces are present in the basal ganglia. Mild white matter changes extend into the brainstem. The cerebellum is unremarkable. The internal auditory canals are unremarkable. Flow is present in the major intracranial arteries.  The globes and orbits are intact. A small polyp or mucous retention cyst is noted along the medial aspect of the left maxillary sinus. The paranasal sinuses are otherwise clear. There is fluid in the right mastoid air cells. No obstructing nasopharyngeal lesion is present. Skullbase is within normal limits. Midline structures are unremarkable.  IMPRESSION: 1. No acute intracranial abnormality or change to account for acute encephalopathy. 2. Moderate generalized atrophy and white matter disease likely reflects the sequela of chronic microvascular ischemia.   Electronically Signed   By: San Morelle M.D.   On: 04/06/2015  09:56   Dg Chest Port 1 View  04/04/2015   CLINICAL DATA:  Rhonchi with concern for aspiration  EXAM: PORTABLE CHEST - 1 VIEW  COMPARISON:  April 03, 2015  FINDINGS: Central catheter tip is in the superior vena cava. Nasogastric tube tip and side port below the diaphragm. No pneumothorax. There is consolidation in the medial left base. There is patchy infiltrate in the right lower lobe, essentially stable allowing for some difference in the degree of inspiration. Lungs elsewhere clear. Heart size and pulmonary vascularity are normal. No adenopathy.  IMPRESSION: Consolidation medial left base. Patchy infiltrate right lower lobe. Aspiration is a differential consideration given these findings. Lungs elsewhere clear. Tube and catheter positions as described without pneumothorax. No change in cardiac silhouette.   Electronically Signed   By: Lowella Grip III M.D.   On: 04/04/2015 09:08   Dg Chest Port 1 View  04/03/2015   CLINICAL DATA:  Respiratory acidosis.  EXAM: PORTABLE CHEST - 1 VIEW  COMPARISON:  04/02/2015.  FINDINGS: Interim extubation. NG tube and left IJ line in stable position. Stable cardiomegaly. Persistent but improving right lower lobe infiltrate. No pneumothorax. No acute bony abnormality .  IMPRESSION: 1. Interim extubation.  NG tube and left IJ line in stable position. 2. Persistent but improving right lower lobe infiltrate. 3. Stable cardiomegaly.   Electronically Signed   By: Marcello Moores  Register   On: 04/03/2015 07:23   Dg Chest Port 1 View  04/02/2015   CLINICAL DATA:  Hypoxia  EXAM: PORTABLE CHEST - 1 VIEW  COMPARISON:  April 01, 2015  FINDINGS: Endotracheal tube tip is 4.7 cm above the carina. Central catheter tip is in the superior vena cava just beyond the junction with the right atrium. Nasogastric tube tip is at the gastroesophageal junction with the side port slightly above the gastroesophageal junction. No pneumothorax. There is patchy infiltrate in both lung bases with left  lower lobe consolidation. Infiltrate is more patchy on the right. Heart is upper normal in size with pulmonary vascularity within normal limits. No adenopathy.  IMPRESSION: Airspace disease in both lower lobes with consolidation in the left base and more patchy infiltrate in the right base. These changes are stable. No change in cardiac silhouette. Tube and catheter positions are unchanged without pneumothorax. Note that the nasogastric tube side port is above the gastroesophageal junction. It may be prudent to advance nasogastric tube 8-10 cm to insure that both nasogastric tube tip and side port are well within the stomach.   Electronically Signed   By: Lowella Grip III M.D.   On: 04/02/2015 06:59   Dg Chest Port 1 View  04/01/2015   CLINICAL DATA:  Intubation.  EXAM: PORTABLE CHEST - 1 VIEW  COMPARISON:  03/31/2015.  FINDINGS: Endotracheal tube, NG tube, left IJ line in stable position. Stable cardiomegaly. Low lung volumes with bilateral lower lobe atelectasis and/or infiltrates. No pleural effusion or pneumothorax.  IMPRESSION: 1. Lines and tubes in stable position. 2. Low lung volumes with bibasilar atelectasis and/or infiltrates. 3. Stable cardiomegaly.   Electronically Signed   By: Marcello Moores  Register   On: 04/01/2015 07:17   Dg Chest Port 1 View  03/31/2015   CLINICAL DATA:  74 year old male with acute GI bleeding, intubated.  EXAM: PORTABLE CHEST - 1 VIEW  COMPARISON:  03/30/2015 and earlier.  FINDINGS: Portable AP semi upright view at 0535 hours. Stable endotracheal tube tip at the level the clavicles. Enteric tube courses to the left upper quadrant, tip not included. Stable left IJ central line. Increased veiling opacity right greater than left with increased lower lobe opacity and obscuration of the diaphragm. Stable cardiac size and mediastinal contours. Upper lobes are stable. No pneumothorax or pulmonary edema.  IMPRESSION: 1.  Stable lines and tubes. 2. Progressed bilateral pleural effusions  and lower lobe collapse or consolidation.   Electronically Signed   By: Genevie Ann M.D.   On: 03/31/2015 07:49   Dg Chest Port 1 View  03/30/2015   CLINICAL DATA:  Extubated and than the reading intubated.  EXAM: PORTABLE CHEST - 1 VIEW  COMPARISON:  Earlier today.  FINDINGS: Endotracheal tube in satisfactory position. Nasogastric tube extending into the stomach. Increased left basilar airspace opacity. Interval mild right mid lung zone airspace opacity with no significant change in right lower lung zone airspace opacity. Small left pleural effusion. Stable left jugular catheter. Mild scoliosis.  IMPRESSION: 1. Increased left basilar atelectasis or pneumonia. 2. Small left pleural effusion. 3. Interval mild  right mid lung zone probable pneumonia. 4. No significant change in right basilar atelectasis and possible pneumonia.   Electronically Signed   By: Claudie Revering M.D.   On: 03/30/2015 16:14   Dg Chest Port 1 View  03/30/2015   CLINICAL DATA:  Cardiac arrest.  EXAM: PORTABLE CHEST - 1 VIEW  COMPARISON:  03/29/2015 and prior exams  FINDINGS: Endotracheal tube tip 7 cm above the carina, left IJ central venous catheter with tip overlying the lower SVC, NG tube entering the stomach with tip off the field of view and right IJ central venous catheter sheath noted.  Right basilar atelectasis/airspace disease has slightly increased.  Mild left basilar atelectasis has improved.  There is no evidence of pneumothorax.  IMPRESSION: Slightly increased right basilar atelectasis/ airspace disease and slightly improved left basilar atelectasis.  Interval placement of NG tube entering the stomach.  No other significant change.   Electronically Signed   By: Margarette Canada M.D.   On: 03/30/2015 07:19   Dg Chest Port 1 View  03/29/2015   CLINICAL DATA:  Cardiac arrest  EXAM: PORTABLE CHEST - 1 VIEW  COMPARISON:  03/29/2015 at 7:  17  FINDINGS: Endotracheal tube and LEFT central venous line unchanged. Pad artifact overlies central  chest. Normal cardiac silhouette. There is LEFT basilar atelectasis. No pneumothorax.  IMPRESSION: Mild increased LEFT basilar atelectasis.  Endotracheal tube unchanged.  No pneumothorax.   Electronically Signed   By: Suzy Bouchard M.D.   On: 03/29/2015 08:41   Dg Chest Port 1 View  03/29/2015   CLINICAL DATA:  intubation central line placement.  EXAM: PORTABLE CHEST - 1 VIEW  COMPARISON:  None.  FINDINGS: Endotracheal tube 7 cm from carina. Normal cardiac silhouette. No effusion, infiltrate, pneumothorax. RIGHT IJ sheath.  IMPRESSION: Endotracheal tube 7 cm from carina.   Electronically Signed   By: Suzy Bouchard M.D.   On: 03/29/2015 07:46   Dg Chest Port 1 View  03/29/2015   CLINICAL DATA:  74 year old male status post central line placement.  EXAM: PORTABLE CHEST - 1 VIEW  COMPARISON:  Chest x-ray a 03/29/2015.  FINDINGS: New left internal jugular central venous catheter with tip terminating at the superior cavoatrial junction. Right IJ central venous Cordis with tip in the internal jugular vein. An endotracheal tube is in place with tip 7.1 cm above the carina. Lung volumes are low. Bibasilar opacities (left greater than right), most compatible subsegmental atelectasis. No consolidative airspace disease. No pneumothorax. No pleural effusions. No evidence of pulmonary edema. Heart size and mediastinal contours are within normal limits.  IMPRESSION: 1. Support apparatus, as above. New left internal jugular central venous catheter tip terminates at the superior cavoatrial junction. 2. No pneumothorax or other complicating features. 3. Low lung volumes with bibasilar subsegmental atelectasis.   Electronically Signed   By: Vinnie Langton M.D.   On: 03/29/2015 07:33   Dg Abd Portable 1v  04/11/2015   CLINICAL DATA:  Feeding tube placement.  EXAM: PORTABLE ABDOMEN - 1 VIEW  COMPARISON:  04/11/2015  FINDINGS: The feeding tube tip is in the body region of the stomach.  IMPRESSION: The feeding tube tip is  in the body region of the stomach.   Electronically Signed   By: Marijo Sanes M.D.   On: 04/11/2015 19:39   Dg Abd Portable 1v  04/07/2015   CLINICAL DATA:  NG tube placement.  EXAM: PORTABLE ABDOMEN - 1 VIEW  COMPARISON:  Yesterday at 1900 hour, multiple prior exams.  FINDINGS: Tip of the weighted enteric tube has been advanced and now lies in the region of the distal stomach. Air-filled small bowel loops again seen in the central abdomen, with a generalized pattern of decreased distension.  IMPRESSION: Tip of the weighted enteric tube in the region of the distal stomach.   Electronically Signed   By: Jeb Levering M.D.   On: 04/07/2015 04:58   Dg Abd Portable 1v  04/06/2015   CLINICAL DATA:  74 year old male with enteric tube placement. Evaluate for tube position.  EXAM: PORTABLE ABDOMEN - 1 VIEW  COMPARISON:  Radiograph dated 04/04/2015  FINDINGS: An enteric tube is seen with tip in the epigastric area, likely within the stomach. Multiple dilated air-filled loops of small bowel again noted. There has been interval decrease in the air-filled loops of small bowel compared to the prior study. Right upper quadrant cholecystectomy clips noted. Midline vertical cutaneous surgical clips seen.  IMPRESSION: Enteric tube in the epigastric area likely within the stomach.   Electronically Signed   By: Anner Crete M.D.   On: 04/06/2015 19:17   Dg Abd Portable 1v  04/04/2015   CLINICAL DATA:  Abdominal distention  EXAM: PORTABLE ABDOMEN - 1 VIEW  COMPARISON:  Study obtained earlier in the day  FINDINGS: Nasogastric tube tip and side port are in the stomach. There is moderate generalized bowel dilatation. No air-fluid levels are appreciated. There are multiple surgical clips present. No free air is appreciable on this supine examination.  IMPRESSION: Generalized bowel dilatation in a pattern most suggestive of ileus. No free air appreciable on this supine examination. Nasogastric tube tip and side port in  stomach.   Electronically Signed   By: Lowella Grip III M.D.   On: 04/04/2015 09:10   Dg Abd Portable 1v  04/04/2015   CLINICAL DATA:  74 year old male status post enteric tube placement. Confirm positioning.  EXAM: PORTABLE ABDOMEN - 1 VIEW  COMPARISON:  Radiograph dated 04/03/2015  FINDINGS: An enteric tube is partially visualized with tip over the gastric bubble. There is better distention of the stomach. Multiple dilated air-filled loops of small bowel noted. Right upper quadrant cholecystectomy clips. Surgical clips noted over the midline abdomen.  IMPRESSION: Enteric tube with tip over the gastric bubble.   Electronically Signed   By: Anner Crete M.D.   On: 04/04/2015 01:32   Dg Abd Portable 1v  04/03/2015   CLINICAL DATA:  75 year old male status post enteric tube placement. Initial encounter. Recent exploratory laparotomy with total colectomy for lower GI bleeding.  EXAM: PORTABLE ABDOMEN - 1 VIEW  COMPARISON:  04/02/2015.  FINDINGS: Portable AP supine view at 1504 hours. Enteric tube courses to the left upper quadrant. The tip projects over the the proximal gastric air. The side hole is not identified.  Mild motion artifact. Midline abdominal skin staples and right upper quadrant surgical clips again noted. Stable gas pattern with multiple gas-filled small bowel loops in the mid abdomen. Increased density at the left lung base. See chest radiograph from today reported separately.  IMPRESSION: 1. Enteric tube tip terminates in the stomach. 2. Multiple gas-filled small bowel loops in the upper abdomen may indicate ileus.   Electronically Signed   By: Genevie Ann M.D.   On: 04/03/2015 15:13   Dg Abd Portable 1v  04/02/2015   CLINICAL DATA:  NG tube placement  EXAM: PORTABLE ABDOMEN - 1 VIEW  COMPARISON:  None.  FINDINGS: NG tube projects over the stomach. Numerous mildly distended loops  of small bowel. Gas is seen into the rectum.  IMPRESSION: NG tube over the stomach.   Electronically Signed   By:  Skipper Cliche M.D.   On: 04/02/2015 23:48   Dg Addison Bailey G Tube Plc W/fl-no Rad  04/11/2015   CLINICAL DATA:    NASO G TUBE PLACEMENT WITH FLUORO  Fluoroscopy was utilized by the requesting physician.  No radiographic  interpretation.    Dg Swallowing Func-speech Pathology  04/12/2015    Objective Swallowing Evaluation:    Patient Details  Name: Jakobee Brackins MRN: [id number] Date of Birth: [birth year]  Today's Date: 04/12/2015 Time: SLP Start Time (ACUTE ONLY): 1120-SLP Stop Time (ACUTE ONLY): 1143 SLP Time Calculation (min) (ACUTE ONLY): 23 min  Past Medical History:  Past Medical History  Diagnosis Date  . Memory loss   . Hypertension   . Diabetes mellitus without complication   . Diverticulosis     Of colon  . Hepatitis C     Virus has never been treated. No history of cirrhosis.  . Prostate cancer ~ 1990s.    Treated with radiation seed implant.  . Hiatal hernia     Does not require meds for reflux symptoms.   Past Surgical History:  Past Surgical History  Procedure Laterality Date  . Laparoscopic cholecystectomy  ~ 2003  . Insertion prostate radiation seed  1990s  . Esophagogastroduodenoscopy N/A 03/29/2015    Procedure: ESOPHAGOGASTRODUODENOSCOPY (EGD);  Surgeon: Jerene Bears, MD;   Location: [facility] ENDOSCOPY;  Service: Endoscopy;  Laterality: N/A;  . Colectomy N/A 03/29/2015    Procedure: EXPLORATORY LAP WITH TOTAL COLECTOMY;  Surgeon: Ralene Ok, MD;  Location: [place];  Service: General;  Laterality: N/A;  . Cystoscopy N/A 03/29/2015    Procedure: CYSTOSCOPY WITH PLACEMENT OF [address]   Surgeon: Ralene Ok, MD;  Location: [place];  Service: General;   Laterality: N/A;   HPI:  Other Pertinent Information: Pt is a 74 yo male with a PMH of dementia,  type 2 DM treated with oral agents, HTN. Hepatitis C positive,  diverticulosis on colonoscopy, prostate cancer. Pt was admitted on 03/27/15  when the pt was found unresponsive and in shock (hemorhagic shock). MD  suspected lower GIB likely diverticular,  with hemmorhagic shock, pt had an  emergent subtotal colectomy. CXR shows improving lower right lobe  infiltrate. Pt was intubated 03/29/15 - 03/30/15, 03/30/15-04/02/15, currently  NPO. Last MBS one week prior, 04-05-15 with NPO recommendations.    No Data Recorded  Assessment / Plan / Recommendation CHL IP CLINICAL IMPRESSIONS 04/12/2015  Therapy Diagnosis Mild to moderate pharyngeal phase dysphagia  Clinical Impression Pt with significant improvement in swallow function  and protection of airway since last MBS one week ago. This date presenting  with mild to moderate pharyngeal dysphagia. No penetration or aspiration  seen with thin liquids by teaspoon or large cup sips, however swallow  initiation was delayed to the level of the pyriform sinuses. Aspiration  only evidenced with use of thin liquids by straw which was sensed by the  patient. Vallecular and pyriform sinus residuals present secondary to  reduced tongue base retraction and decreased laryngeal elevation.  Additional reflexive swallows by the patient reduced residual amounts.  Pt  with functional mastication of solids, however given prior severity of  dypshagia history and decreased cognitive abilities recommend conservative  dypshagia 2 (chopped) diet and thin liquids. Whole barium tablet attempted  in puree, which was extracted from oral cavity due to difficulty  with  propulsion throughout oropharynx. Recommend pills crushed with puree. FULL  supervision with all PO. NO straws. ST to follow up for diet tolerance.            CHL IP TREATMENT RECOMMENDATION 04/12/2015  Treatment Recommendations Therapy as outlined in treatment plan below     CHL IP DIET RECOMMENDATION 04/12/2015  SLP Diet Recommendations Dysphagia 2 (Fine chop);Thin  Liquid Administration via (None)  Medication Administration Crushed with puree  Compensations Small sips/bites;Slow rate;Follow solids with liquid  Postural Changes and/or Swallow Maneuvers (None)     CHL IP OTHER RECOMMENDATIONS  04/12/2015  Recommended Consults (None)  Oral Care Recommendations Oral care BID  Other Recommendations (None)     CHL IP FOLLOW UP RECOMMENDATIONS 04/11/2015  Follow up Recommendations Skilled Nursing facility;24 hour  supervision/assistance     CHL IP FREQUENCY AND DURATION 04/12/2015  Speech Therapy Frequency (ACUTE ONLY) min 2x/week  Treatment Duration 1 week     Pertinent Vitals/Pain     SLP Swallow Goals No flowsheet data found.  No flowsheet data found.    CHL IP REASON FOR REFERRAL 04/12/2015  Reason for Referral Objectively evaluate swallowing function           [name] MA, CCC-SLP Acute Care Speech Language Pathologist       Levi Aland 04/12/2015, 12:51 PM    Dg Swallowing Func-speech Pathology  04/05/2015    Objective Swallowing Evaluation:    Patient Details  Name: Mayer Vondrak MRN: [id number] Date of Birth: 14-Jun-1941  Today's Date: 04/05/2015 Time: SLP Start Time (ACUTE ONLY): 1555-SLP Stop Time (ACUTE ONLY): 1615 SLP Time Calculation (min) (ACUTE ONLY): 20 min  Past Medical History:  Past Medical History  Diagnosis Date  . Memory loss   . Hypertension   . Diabetes mellitus without complication   . Diverticulosis     Of colon  . Hepatitis C     Virus has never been treated. No history of cirrhosis.  . Prostate cancer ~ 1990s.    Treated with radiation seed implant.  . Hiatal hernia     Does not require meds for reflux symptoms.   Past Surgical History:  Past Surgical History  Procedure Laterality Date  . Laparoscopic cholecystectomy  ~ 2003  . Insertion prostate radiation seed  1990s  . Esophagogastroduodenoscopy N/A 03/29/2015    Procedure: ESOPHAGOGASTRODUODENOSCOPY (EGD);  Surgeon: Jerene Bears, MD;   Location[facility] ENDOSCOPY;  Service: Endoscopy;  Laterality: N/A;  . Colectomy N/A 03/29/2015    Procedure: EXPLORATORY LAP WITH TOTAL COLECTOMY;  Surgeon: Ralene Ok, MD;  Location: [facility];  Service: General;  Laterality: N/A;  . Cystoscopy N/A 03/29/2015    Procedure: CYSTOSCOPY WITH PLACEMENT OF  15 [address].;   Surgeon: Ralene Ok, MD;  Location: [place];  Service: General;   Laterality: N/A;   HPI:  Other Pertinent Information: Pt is a 74 yo male with a PMH of dementia,  type 2 DM treated with oral agents, HTN. Hepatitis C positive,  diverticulosis on colonoscopy, prostate cancer. Pt was admitted on 03/27/15  when the pt was found unresponsive and in shock (hemorhagic shock). MD  suspected lower GIB likely diverticular, with hemmorhagic shock, pt had an  emergent subtotal colectomy. CXR shows improving lower right lobe  infiltrate. Pt was intubated 03/29/15 - 03/30/15, 03/30/15-04/02/15, currently  NPO.   No Data Recorded  Assessment / Plan / Recommendation CHL IP CLINICAL IMPRESSIONS 04/05/2015  Therapy Diagnosis Mild oral phase dysphagia;Moderate  pharyngeal phase  dysphagia;Severe pharyngeal phase dysphagia  Clinical Impression MBSS was completed.  The patient presented with mild  oral and mod-severe pharyngeal dysphagia characterized by motor sensotry  deficits.  The patient demonstrated delayed oral transit, poor bolus  cohesion with premature spill of material with some oral residuals,  delayed swallow trigger, decreased epiglottic inversion, poor airway  closure and decreased constriction with vallecular and pyriform residue.   Silent penetration of all textures was noted during the swallow.   Compensatory strategies were not attempted due to the patient's confusion.   Safest diet is NPO.  ST will follow for trial swallowing therapy which  will be dependent upon the patient's ability to follow commands.        CHL IP TREATMENT RECOMMENDATION 04/05/2015  Treatment Recommendations Therapy as outlined in treatment plan below     CHL IP DIET RECOMMENDATION 04/05/2015  SLP Diet Recommendations NPO  Liquid Administration via (None)  Medication Administration Via alternative means  Compensations (None)  Postural Changes and/or Swallow Maneuvers (None)     CHL IP OTHER RECOMMENDATIONS 04/05/2015   Recommended Consults (None)  Oral Care Recommendations Oral care QID  Other Recommendations (None)     No flowsheet data found.   CHL IP FREQUENCY AND DURATION 04/05/2015  Speech Therapy Frequency (ACUTE ONLY) min 2x/week  Treatment Duration 2 weeks     Pertinent Vitals/Pain No active distress.                        Lamar Sprinkles 04/05/2015, 4:22 PM  Shelly Flatten, MA, CCC-SLP Acute Rehab SLP [phone]      Microbiology: No results found for this or any previous visit (from the past 240 hour(s)).   Labs: Basic Metabolic Panel:  Recent Labs Lab 04/10/15 0315 04/12/15 0930  NA 142 136  K 4.1 3.8  CL 110 104  CO2 25 23  GLUCOSE 107* 113*  BUN 9 9  CREATININE 1.21 1.10  CALCIUM 8.7* 8.9   Liver Function Tests: No results for input(s): AST, ALT, ALKPHOS, BILITOT, PROT, ALBUMIN in the last 168 hours. No results for input(s): LIPASE, AMYLASE in the last 168 hours. No results for input(s): AMMONIA in the last 168 hours. CBC:  Recent Labs Lab 04/10/15 0315 04/10/15 1015 04/10/15 1800 04/11/15 0815 04/12/15 0930  WBC 10.6* 10.5 13.5* 10.0 9.2  NEUTROABS  --  7.6  --   --   --   HGB 10.2* 10.3* 10.7* 10.1* 11.0*  HCT 32.3* 32.9* 33.4* 32.2* 33.9*  MCV 95.3 95.6 96.0 95.3 94.7  PLT 457* 448* 441* 435* 424*   Cardiac Enzymes: No results for input(s): CKTOTAL, CKMB, CKMBINDEX, TROPONINI in the last 168 hours. BNP: BNP (last 3 results) No results for input(s): BNP in the last 8760 hours.  ProBNP (last 3 results) No results for input(s): PROBNP in the last 8760 hours.  CBG:  Recent Labs Lab 04/15/15 1551 04/15/15 2007 04/16/15 0027 04/16/15 0427 04/16/15 0822  GLUCAP 131* 142* 135* 129* 141*       Signed:  ELMAHI,MUTAZ A  [facility] 04/16/2015, 10:48 AM

## 2015-04-22 ENCOUNTER — Non-Acute Institutional Stay (SKILLED_NURSING_FACILITY): Payer: Medicare (Managed Care) | Admitting: Internal Medicine

## 2015-04-22 ENCOUNTER — Ambulatory Visit: Payer: Medicare (Managed Care) | Admitting: Family Medicine

## 2015-04-22 ENCOUNTER — Encounter: Payer: Self-pay | Admitting: Internal Medicine

## 2015-04-22 DIAGNOSIS — E119 Type 2 diabetes mellitus without complications: Secondary | ICD-10-CM | POA: Diagnosis not present

## 2015-04-22 DIAGNOSIS — I10 Essential (primary) hypertension: Secondary | ICD-10-CM | POA: Diagnosis not present

## 2015-04-22 DIAGNOSIS — F0391 Unspecified dementia with behavioral disturbance: Secondary | ICD-10-CM

## 2015-04-22 DIAGNOSIS — J690 Pneumonitis due to inhalation of food and vomit: Secondary | ICD-10-CM

## 2015-04-22 DIAGNOSIS — Z8546 Personal history of malignant neoplasm of prostate: Secondary | ICD-10-CM | POA: Diagnosis not present

## 2015-04-22 DIAGNOSIS — R1312 Dysphagia, oropharyngeal phase: Secondary | ICD-10-CM

## 2015-04-22 DIAGNOSIS — D62 Acute posthemorrhagic anemia: Secondary | ICD-10-CM

## 2015-04-22 DIAGNOSIS — Z8719 Personal history of other diseases of the digestive system: Secondary | ICD-10-CM

## 2015-04-22 NOTE — Progress Notes (Signed)
Patient ID: Jesse [name], male   DOB: Jun 21, 1941, 74 y.o.   MRN: [id number]    HISTORY AND PHYSICAL   DATE: 04/22/15  Location:  [facility]    Place of Service: SNF [phone])   Extended Emergency Contact Information Primary Emergency Contact: Riordan,[name] Address: [address]          [place], [place] [address] Johnnette Litter of [place] Phone: [phone] Relation: Spouse  Advanced Directive information  FULL CODE  Chief Complaint  Patient presents with  . New Admit To SNF    HPI:  74 yo male seen today as a new admission into SNF following hospital stay for acute GI bleed s/p   . He rec'd a total of 16 units PRBCs for severe acute blood loss anemia. Note presenting BP in ER 78/55 and had ST @ 126 by ECG. EGD showed no source of bleed. He underwent exploratory laparotomy with total colectomy and ileostomy placement for presumed diverticular bleed. He was also transfused 3 units platelets and 2 units FFP. He developed acute respiratory failure due to Strep pneumoniae aspiration pneumonia and was tx with IV vanco and zosyn x 7 days.. He had episodes of sundowning. 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 He was placed on seroquel and trazodone. He did undergo modified barium swallow for dysphagia and SLP recommended dysphagia 2 with thin liquids.   Pt has no c/o today. He is a poor historian due to dementia. Hx obtained from nursing and chart. Per nursing, pt has been combative at times with care  HTN - stable on lisinopril. Dose was reduced prior to d/c from hospital  DM - stable. A1c 6.1%. Metformin was stopped in hospital     Past Medical History  Diagnosis Date  . Memory loss   . Hypertension   . Diabetes mellitus without complication   . Diverticulosis     Of colon  . Hepatitis C     Virus has never been treated. No history of cirrhosis.  . Prostate cancer ~ 1990s.    Treated with radiation seed implant.  . Hiatal  hernia     Does not require meds for reflux symptoms.    Past Surgical History  Procedure Laterality Date  . Laparoscopic cholecystectomy  ~ 2003  . Insertion prostate radiation seed  1990s  . Esophagogastroduodenoscopy N/A 03/29/2015    Procedure: ESOPHAGOGASTRODUODENOSCOPY (EGD);  Surgeon: Jerene Bears, MD;  Location: [facility] ENDOSCOPY;  Service: Endoscopy;  Laterality: N/A;  . Colectomy N/A 03/29/2015    Procedure: EXPLORATORY LAP WITH TOTAL COLECTOMY;  Surgeon: Ralene Ok, MD;  Location: [facility];  Service: General;  Laterality: N/A;  . Cystoscopy N/A 03/29/2015    Procedure: CYSTOSCOPY WITH PLACEMENT OF [place].;  Surgeon: Ralene Ok, MD;  Location: [facility];  Service: General;  Laterality: N/A;    No care team member to display  Social History   Social History  . Marital Status: Married    Spouse Name: N/A  . Number of Children: N/A  . Years of Education: N/A   Occupational History  . Not on file.   Social History Main Topics  . Smoking status: Never Smoker   . Smokeless tobacco: Not on file  . Alcohol Use: Yes     Comment: occ  . Drug Use: No  . Sexual Activity: Not on file   Other Topics Concern  . 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     reports that he has never smoked. He does not have any smokeless tobacco history on file. He reports that he drinks alcohol. He reports that he does not use illicit drugs.  No family history on file. No family status information on file.    There is no immunization history for the selected administration types on file for this patient.  No Known Allergies  Medications: Patient's Medications  New Prescriptions   No medications on file  Previous Medications   FEEDING SUPPLEMENT, ENSURE ENLIVE, (ENSURE ENLIVE) LIQD    Take 237 mLs by mouth 2 (two) times daily between meals.   LISINOPRIL (PRINIVIL,ZESTRIL) 5 MG TABLET    Take 1 tablet (5 mg  total) by mouth daily.   QUETIAPINE (SEROQUEL) 50 MG TABLET    Take 1 tablet (50 mg total) by mouth at bedtime.   TRAZODONE (DESYREL) 50 MG TABLET    Take 0.5 tablets (25 mg total) by mouth at bedtime.  Modified Medications   No medications on file  Discontinued Medications   No medications on file    Review of Systems  Unable to perform ROS: Dementia    Filed Vitals:   04/22/15 2221  BP: 116/84  Pulse: 100  Temp: 97.9 F (36.6 C)  Weight: 191 lb (86.637 kg)  SpO2: 98%   Body mass index is 26.65 kg/(m^2).  Physical Exam  Constitutional: He appears well-developed and well-nourished. No distress.  Disheveled appearance in NAD  HENT:  Mouth/Throat: Oropharynx is clear and moist.  Eyes: Pupils are equal, round, and reactive to light. No scleral icterus.  Neck: Neck supple. Carotid bruit is not present.  Cardiovascular: Regular rhythm, normal heart sounds and intact distal pulses.  Tachycardia present.  Exam reveals no gallop and no friction rub.   No murmur heard. no distal LE swelling. No calf TTP  Pulmonary/Chest: Effort normal and breath sounds normal. He has no wheezes. He has no rales. He exhibits no tenderness.  Abdominal: Soft. Bowel sounds are normal. He exhibits no distension, no abdominal bruit, no pulsatile midline mass and no mass. There is no hepatosplenomegaly. There is no tenderness. There is no rebound and no guarding.    Lymphadenopathy:    He has no cervical adenopathy.  Neurological: He is alert.  Skin: Skin is warm and dry. No rash noted.  Tattoos present  Psychiatric: He has a normal mood and affect. His behavior is normal.     Labs reviewed: Admission on 03/27/2015, Discharged on 04/16/2015  No results displayed because visit has over 200 results.     CBC Latest Ref Rng 04/12/2015 04/11/2015 04/10/2015  WBC 4.0 - 10.5 K/uL 9.2 10.0 13.5(H)  Hemoglobin 13.0 - 17.0 g/dL 11.0(L) 10.1(L) 10.7(L)  Hematocrit 39.0 - 52.0 % 33.9(L) 32.2(L) 33.4(L)    Platelets 150 - 400 K/uL 424(H) 435(H) 441(H)    CMP Latest Ref Rng 04/12/2015 04/10/2015 04/09/2015  Glucose 65 - 99 mg/dL 113(H) 107(H) 147(H)  BUN 6 - 20 mg/dL 9 9 9   Creatinine 0.61 - 1.24 mg/dL 1.10 1.21 1.12  Sodium 135 - 145 mmol/L 136 142 142  Potassium 3.5 - 5.1 mmol/L 3.8 4.1 3.7  Chloride 101 - 111 mmol/L 104 110 111  CO2 22 - 32 mmol/L 23 25 24   Calcium 8.9 - 10.3 mg/dL 8.9 8.7(L) 8.9  Total Protein 6.5 - 8.1 g/dL - - -  Total Bilirubin 0.3 - 1.2 mg/dL - - -  Alkaline Phos 38 - 126 U/L - - -  AST 15 - 41 U/L - - -  ALT 17 - 63 U/L - - -    Lab Results  Component Value Date   HGBA1C 6.1* 03/27/2015     Dg Abd 1 View  04/11/2015   CLINICAL DATA:  Feeding tube placement  EXAM: ABDOMEN - 1 VIEW  COMPARISON:  04/07/2015  FINDINGS: Nasoenteric feeding tube has been passed to the level of the ligament of Treitz. Contrast injection confirms patency and appropriate positioning.  IMPRESSION: 1. Nasoenteric feeding tube to ligament of Treitz.   Electronically Signed   By: Lucrezia Europe M.D.   On: 04/11/2015 10:20   Mr Brain Wo Contrast  04/06/2015   CLINICAL DATA:  Encephalopathy.  Altered mental status.  Delirium.  EXAM: MRI HEAD WITHOUT CONTRAST  TECHNIQUE: Multiplanar, multiecho pulse sequences of the brain and surrounding structures were obtained without intravenous contrast.  COMPARISON:  None.  FINDINGS: Moderate generalized atrophy and white matter disease is present bilaterally. Dilated perivascular spaces are present in the basal ganglia. Mild white matter changes extend into the brainstem. The cerebellum is unremarkable. The internal auditory canals are unremarkable. Flow is present in the major intracranial arteries.  The globes and orbits are intact. A small polyp or mucous retention cyst is noted along the medial aspect of the left maxillary sinus. The paranasal sinuses are otherwise clear. There is fluid in the right mastoid air cells. No obstructing nasopharyngeal lesion is  present. Skullbase is within normal limits. Midline structures are unremarkable.  IMPRESSION: 1. No acute intracranial abnormality or change to account for acute encephalopathy. 2. Moderate generalized atrophy and white matter disease likely reflects the sequela of chronic microvascular ischemia.   Electronically Signed   By: San Morelle M.D.   On: 04/06/2015 09:56   Dg Chest Port 1 View  04/04/2015   CLINICAL DATA:  Rhonchi with concern for aspiration  EXAM: PORTABLE CHEST - 1 VIEW  COMPARISON:  April 03, 2015  FINDINGS: Central catheter tip is in the superior vena cava. Nasogastric tube tip and side port below the diaphragm. No pneumothorax. There is consolidation in the medial left base. There is patchy infiltrate in the right lower lobe, essentially stable allowing for some difference in the degree of inspiration. Lungs elsewhere clear. Heart size and pulmonary vascularity are normal. No adenopathy.  IMPRESSION: Consolidation medial left base. Patchy infiltrate right lower lobe. Aspiration is a differential consideration given these findings. Lungs elsewhere clear. Tube and catheter positions as described without pneumothorax. No change in cardiac silhouette.   Electronically Signed   By: Lowella Grip III M.D.   On: 04/04/2015 09:08   Dg Chest Port 1 View  04/03/2015   CLINICAL DATA:  Respiratory acidosis.  EXAM: PORTABLE CHEST - 1 VIEW  COMPARISON:  04/02/2015.  FINDINGS: Interim extubation. NG tube and left IJ line in stable position. Stable cardiomegaly. Persistent but improving right lower lobe infiltrate. No pneumothorax. No acute bony abnormality .  IMPRESSION: 1. Interim extubation.  NG tube and left IJ line in stable position. 2. Persistent but improving right lower lobe infiltrate. 3. Stable cardiomegaly.   Electronically Signed   By: Marcello Moores  Register   On: 04/03/2015 07:23   Dg Chest Port 1 View  04/02/2015   CLINICAL DATA:  Hypoxia  EXAM: PORTABLE CHEST - 1 VIEW  COMPARISON:   April 01, 2015  FINDINGS: Endotracheal tube tip is 4.7 cm above the carina. Central catheter tip  is in the superior vena cava just beyond the junction with the right atrium. Nasogastric tube tip is at the gastroesophageal junction with the side port slightly above the gastroesophageal junction. No pneumothorax. There is patchy infiltrate in both lung bases with left lower lobe consolidation. Infiltrate is more patchy on the right. Heart is upper normal in size with pulmonary vascularity within normal limits. No adenopathy.  IMPRESSION: Airspace disease in both lower lobes with consolidation in the left base and more patchy infiltrate in the right base. These changes are stable. No change in cardiac silhouette. Tube and catheter positions are unchanged without pneumothorax. Note that the nasogastric tube side port is above the gastroesophageal junction. It may be prudent to advance nasogastric tube 8-10 cm to insure that both nasogastric tube tip and side port are well within the stomach.   Electronically Signed   By: Lowella Grip III M.D.   On: 04/02/2015 06:59   Dg Chest Port 1 View  04/01/2015   CLINICAL DATA:  Intubation.  EXAM: PORTABLE CHEST - 1 VIEW  COMPARISON:  03/31/2015.  FINDINGS: Endotracheal tube, NG tube, left IJ line in stable position. Stable cardiomegaly. Low lung volumes with bilateral lower lobe atelectasis and/or infiltrates. No pleural effusion or pneumothorax.  IMPRESSION: 1. Lines and tubes in stable position. 2. Low lung volumes with bibasilar atelectasis and/or infiltrates. 3. Stable cardiomegaly.   Electronically Signed   By: Marcello Moores  Register   On: 04/01/2015 07:17   Dg Chest Port 1 View  03/31/2015   CLINICAL DATA:  74 year old male with acute GI bleeding, intubated.  EXAM: PORTABLE CHEST - 1 VIEW  COMPARISON:  03/30/2015 and earlier.  FINDINGS: Portable AP semi upright view at 0535 hours. Stable endotracheal tube tip at the level the clavicles. Enteric tube courses to the left  upper quadrant, tip not included. Stable left IJ central line. Increased veiling opacity right greater than left with increased lower lobe opacity and obscuration of the diaphragm. Stable cardiac size and mediastinal contours. Upper lobes are stable. No pneumothorax or pulmonary edema.  IMPRESSION: 1.  Stable lines and tubes. 2. Progressed bilateral pleural effusions and lower lobe collapse or consolidation.   Electronically Signed   By: Genevie Ann M.D.   On: 03/31/2015 07:49   Dg Chest Port 1 View  03/30/2015   CLINICAL DATA:  Extubated and than the reading intubated.  EXAM: PORTABLE CHEST - 1 VIEW  COMPARISON:  Earlier today.  FINDINGS: Endotracheal tube in satisfactory position. Nasogastric tube extending into the stomach. Increased left basilar airspace opacity. Interval mild right mid lung zone airspace opacity with no significant change in right lower lung zone airspace opacity. Small left pleural effusion. Stable left jugular catheter. Mild scoliosis.  IMPRESSION: 1. Increased left basilar atelectasis or pneumonia. 2. Small left pleural effusion. 3. Interval mild right mid lung zone probable pneumonia. 4. No significant change in right basilar atelectasis and possible pneumonia.   Electronically Signed   By: Claudie Revering M.D.   On: 03/30/2015 16:14   Dg Chest Port 1 View  03/30/2015   CLINICAL DATA:  Cardiac arrest.  EXAM: PORTABLE CHEST - 1 VIEW  COMPARISON:  03/29/2015 and prior exams  FINDINGS: Endotracheal tube tip 7 cm above the carina, left IJ central venous catheter with tip overlying the lower SVC, NG tube entering the stomach with tip off the field of view and right IJ central venous catheter sheath noted.  Right basilar atelectasis/airspace disease has slightly increased.  Mild left basilar  atelectasis has improved.  There is no evidence of pneumothorax.  IMPRESSION: Slightly increased right basilar atelectasis/ airspace disease and slightly improved left basilar atelectasis.  Interval placement of  NG tube entering the stomach.  No other significant change.   Electronically Signed   By: Margarette Canada M.D.   On: 03/30/2015 07:19   Dg Chest Port 1 View  03/29/2015   CLINICAL DATA:  Cardiac arrest  EXAM: PORTABLE CHEST - 1 VIEW  COMPARISON:  03/29/2015 at 7:  17  FINDINGS: Endotracheal tube and LEFT central venous line unchanged. Pad artifact overlies central chest. Normal cardiac silhouette. There is LEFT basilar atelectasis. No pneumothorax.  IMPRESSION: Mild increased LEFT basilar atelectasis.  Endotracheal tube unchanged.  No pneumothorax.   Electronically Signed   By: Suzy Bouchard M.D.   On: 03/29/2015 08:41   Dg Chest Port 1 View  03/29/2015   CLINICAL DATA:  intubation central line placement.  EXAM: PORTABLE CHEST - 1 VIEW  COMPARISON:  None.  FINDINGS: Endotracheal tube 7 cm from carina. Normal cardiac silhouette. No effusion, infiltrate, pneumothorax. RIGHT IJ sheath.  IMPRESSION: Endotracheal tube 7 cm from carina.   Electronically Signed   By: Suzy Bouchard M.D.   On: 03/29/2015 07:46   Dg Chest Port 1 View  03/29/2015   CLINICAL DATA:  74 year old male status post central line placement.  EXAM: PORTABLE CHEST - 1 VIEW  COMPARISON:  Chest x-ray a 03/29/2015.  FINDINGS: New left internal jugular central venous catheter with tip terminating at the superior cavoatrial junction. Right IJ central venous Cordis with tip in the internal jugular vein. An endotracheal tube is in place with tip 7.1 cm above the carina. Lung volumes are low. Bibasilar opacities (left greater than right), most compatible subsegmental atelectasis. No consolidative airspace disease. No pneumothorax. No pleural effusions. No evidence of pulmonary edema. Heart size and mediastinal contours are within normal limits.  IMPRESSION: 1. Support apparatus, as above. New left internal jugular central venous catheter tip terminates at the superior cavoatrial junction. 2. No pneumothorax or other complicating features. 3. Low lung  volumes with bibasilar subsegmental atelectasis.   Electronically Signed   By: Vinnie Langton M.D.   On: 03/29/2015 07:33   Dg Abd Portable 1v  04/11/2015   CLINICAL DATA:  Feeding tube placement.  EXAM: PORTABLE ABDOMEN - 1 VIEW  COMPARISON:  04/11/2015  FINDINGS: The feeding tube tip is in the body region of the stomach.  IMPRESSION: The feeding tube tip is in the body region of the stomach.   Electronically Signed   By: Marijo Sanes M.D.   On: 04/11/2015 19:39   Dg Abd Portable 1v  04/07/2015   CLINICAL DATA:  NG tube placement.  EXAM: PORTABLE ABDOMEN - 1 VIEW  COMPARISON:  Yesterday at 1900 hour, multiple prior exams.  FINDINGS: Tip of the weighted enteric tube has been advanced and now lies in the region of the distal stomach. Air-filled small bowel loops again seen in the central abdomen, with a generalized pattern of decreased distension.  IMPRESSION: Tip of the weighted enteric tube in the region of the distal stomach.   Electronically Signed   By: Jeb Levering M.D.   On: 04/07/2015 04:58   Dg Abd Portable 1v  04/06/2015   CLINICAL DATA:  74 year old male with enteric tube placement. Evaluate for tube position.  EXAM: PORTABLE ABDOMEN - 1 VIEW  COMPARISON:  Radiograph dated 04/04/2015  FINDINGS: An enteric tube is seen with tip in the  epigastric area, likely within the stomach. Multiple dilated air-filled loops of small bowel again noted. There has been interval decrease in the air-filled loops of small bowel compared to the prior study. Right upper quadrant cholecystectomy clips noted. Midline vertical cutaneous surgical clips seen.  IMPRESSION: Enteric tube in the epigastric area likely within the stomach.   Electronically Signed   By: Anner Crete M.D.   On: 04/06/2015 19:17   Dg Abd Portable 1v  04/04/2015   CLINICAL DATA:  Abdominal distention  EXAM: PORTABLE ABDOMEN - 1 VIEW  COMPARISON:  Study obtained earlier in the day  FINDINGS: Nasogastric tube tip and side port are in the  stomach. There is moderate generalized bowel dilatation. No air-fluid levels are appreciated. There are multiple surgical clips present. No free air is appreciable on this supine examination.  IMPRESSION: Generalized bowel dilatation in a pattern most suggestive of ileus. No free air appreciable on this supine examination. Nasogastric tube tip and side port in stomach.   Electronically Signed   By: Lowella Grip III M.D.   On: 04/04/2015 09:10   Dg Abd Portable 1v  04/04/2015   CLINICAL DATA:  74 year old male status post enteric tube placement. Confirm positioning.  EXAM: PORTABLE ABDOMEN - 1 VIEW  COMPARISON:  Radiograph dated 04/03/2015  FINDINGS: An enteric tube is partially visualized with tip over the gastric bubble. There is better distention of the stomach. Multiple dilated air-filled loops of small bowel noted. Right upper quadrant cholecystectomy clips. Surgical clips noted over the midline abdomen.  IMPRESSION: Enteric tube with tip over the gastric bubble.   Electronically Signed   By: Anner Crete M.D.   On: 04/04/2015 01:32   Dg Abd Portable 1v  04/03/2015   CLINICAL DATA:  74 year old male status post enteric tube placement. Initial encounter. Recent exploratory laparotomy with total colectomy for lower GI bleeding.  EXAM: PORTABLE ABDOMEN - 1 VIEW  COMPARISON:  04/02/2015.  FINDINGS: Portable AP supine view at 1504 hours. Enteric tube courses to the left upper quadrant. The tip projects over the the proximal gastric air. The side hole is not identified.  Mild motion artifact. Midline abdominal skin staples and right upper quadrant surgical clips again noted. Stable gas pattern with multiple gas-filled small bowel loops in the mid abdomen. Increased density at the left lung base. See chest radiograph from today reported separately.  IMPRESSION: 1. Enteric tube tip terminates in the stomach. 2. Multiple gas-filled small bowel loops in the upper abdomen may indicate ileus.   Electronically  Signed   By: Genevie Ann M.D.   On: 04/03/2015 15:13   Dg Abd Portable 1v  04/02/2015   CLINICAL DATA:  NG tube placement  EXAM: PORTABLE ABDOMEN - 1 VIEW  COMPARISON:  None.  FINDINGS: NG tube projects over the stomach. Numerous mildly distended loops of small bowel. Gas is seen into the rectum.  IMPRESSION: NG tube over the stomach.   Electronically Signed   By: Skipper Cliche M.D.   On: 04/02/2015 23:48   Dg Addison Bailey G Tube Plc W/fl-no Rad  04/11/2015   CLINICAL DATA:    NASO G TUBE PLACEMENT WITH FLUORO  Fluoroscopy was utilized by the requesting physician.  No radiographic  interpretation.    Dg Swallowing Func-speech Pathology  04/12/2015    Objective Swallowing Evaluation:    Patient Details  Name: Jesse [name] MRN: [id number] Date of Birth: 1940/08/25  Today's Date: 04/12/2015 Time: SLP Start Time (ACUTE ONLY): 1120-SLP Stop Time (ACUTE ONLY):  1143 SLP Time Calculation (min) (ACUTE ONLY): 23 min  Past Medical History:  Past Medical History  Diagnosis Date  . Memory loss   . Hypertension   . Diabetes mellitus without complication   . Diverticulosis     Of colon  . Hepatitis C     Virus has never been treated. No history of cirrhosis.  . Prostate cancer ~ 1990s.    Treated with radiation seed implant.  . Hiatal hernia     Does not require meds for reflux symptoms.   Past Surgical History:  Past Surgical History  Procedure Laterality Date  . Laparoscopic cholecystectomy  ~ 2003  . Insertion prostate radiation seed  1990s  . Esophagogastroduodenoscopy N/A 03/29/2015    Procedure: ESOPHAGOGASTRODUODENOSCOPY (EGD);  Surgeon: Jerene Bears, MD;   Location: [facility] ENDOSCOPY;  Service: Endoscopy;  Laterality: N/A;  . Colectomy N/A 03/29/2015    Procedure: EXPLORATORY LAP WITH TOTAL COLECTOMY;  Surgeon: Ralene Ok, MD;  Location: [facility];  Service: General;  Laterality: N/A;  . 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 Hepatitis C positive,  diverticulosis on colonoscopy, prostate cancer. Pt was admitted on 03/27/15  when the pt was found unresponsive and in shock (hemorhagic shock). MD  suspected lower GIB likely diverticular, with hemmorhagic shock, pt had an  emergent subtotal colectomy. CXR shows improving lower right lobe  infiltrate. Pt was intubated 03/29/15 - 03/30/15, 03/30/15-04/02/15, currently  NPO. Last MBS one week prior, 04-05-15 with NPO recommendations.    No Data Recorded  Assessment / Plan / Recommendation CHL IP CLINICAL IMPRESSIONS 04/12/2015  Therapy Diagnosis Mild to moderate pharyngeal phase dysphagia  Clinical Impression Pt with significant improvement in swallow function  and protection of airway since last MBS one week ago. This date presenting  with mild to moderate pharyngeal dysphagia. No penetration or aspiration  seen with thin liquids by teaspoon or large cup sips, however swallow  initiation was delayed to the level of the pyriform sinuses. Aspiration  only evidenced with use of thin liquids by straw which was sensed by the  patient. Vallecular and pyriform sinus residuals present secondary to  reduced tongue base retraction and decreased laryngeal elevation.  Additional reflexive swallows by the patient reduced residual amounts.  Pt  with functional mastication of solids, however given prior severity of  dypshagia history and decreased cognitive abilities recommend conservative  dypshagia 2 (chopped) diet and thin liquids. Whole barium tablet attempted  in puree, which was extracted from oral cavity due to difficulty with  propulsion throughout oropharynx. Recommend pills crushed with puree. FULL  supervision with all PO. NO straws. ST to follow up for diet tolerance.            CHL IP TREATMENT RECOMMENDATION 04/12/2015  Treatment Recommendations  Therapy as outlined in treatment plan below     CHL IP DIET RECOMMENDATION 04/12/2015  SLP Diet Recommendations Dysphagia 2 (Fine chop);Thin  Liquid Administration via (None)  Medication Administration Crushed with puree  Compensations Small sips/bites;Slow rate;Follow solids with liquid  Postural Changes and/or Swallow Maneuvers (None)     CHL IP OTHER RECOMMENDATIONS 04/12/2015  Recommended Consults (None)  Oral Care Recommendations Oral care BID  Other Recommendations (None)     CHL IP FOLLOW UP RECOMMENDATIONS 04/11/2015  Follow up Recommendations Skilled Nursing facility;24 hour  supervision/assistance     CHL IP FREQUENCY AND DURATION 04/12/2015  Speech Therapy Frequency (ACUTE ONLY) min 2x/week  Treatment Duration 1 week     Pertinent Vitals/Pain     SLP Swallow Goals No flowsheet data found.  No flowsheet data found.    CHL IP REASON FOR REFERRAL 04/12/2015  Reason for Referral Objectively evaluate swallowing function           [name] MA, CCC-SLP Acute Care Speech Language Pathologist       Levi Aland 04/12/2015, 12:51 PM    Dg Swallowing Func-speech Pathology  04/05/2015    Objective Swallowing Evaluation:    Patient Details  Name: Jesse [name] MRN: [id number] Date of Birth: [birth year]  Today's Date: 04/05/2015 Time: SLP Start Time (ACUTE ONLY): 1555-SLP Stop Time (ACUTE ONLY): 1615 SLP Time Calculation (min) (ACUTE ONLY): 20 min  Past Medical History:  Past Medical History  Diagnosis Date  . Memory loss   . Hypertension   . Diabetes mellitus without complication   . Diverticulosis     Of colon  . Hepatitis C     Virus has never been treated. No history of cirrhosis.  . Prostate cancer ~ 1990s.    Treated with radiation seed implant.  . Hiatal hernia     Does not require meds for reflux symptoms.   Past Surgical History:  Past Surgical History  Procedure Laterality Date  . Laparoscopic cholecystectomy  ~ 2003  . Insertion prostate radiation seed  1990s  . Esophagogastroduodenoscopy N/A 03/29/2015     Procedure: ESOPHAGOGASTRODUODENOSCOPY (EGD);  Surgeon: Jerene Bears, MD;   Location: [facility] ENDOSCOPY;  Service: Endoscopy;  Laterality: N/A;  . Colectomy N/A 03/29/2015    Procedure: EXPLORATORY LAP WITH TOTAL COLECTOMY;  Surgeon: Ralene Ok, MD;  Location: [place];  Service: General;  Laterality: N/A;  . Cystoscopy N/A 03/29/2015    Procedure: CYSTOSCOPY WITH PLACEMENT OF 2 [place].;   Surgeon: Ralene Ok, MD;  Location: [place];  Service: General;   Laterality: N/A;   HPI:  Other Pertinent Information: Pt is a 74 yo male with a PMH of dementia,  type 2 DM treated with oral agents, HTN. Hepatitis C positive,  diverticulosis on colonoscopy, prostate cancer. Pt was admitted on 03/27/15  when the pt was found unresponsive and in shock (hemorhagic shock). MD  suspected lower GIB likely diverticular, with hemmorhagic shock, pt had an  emergent subtotal colectomy. CXR shows improving lower right lobe  infiltrate. Pt was intubated 03/29/15 - 03/30/15, 03/30/15-04/02/15, currently  NPO.   No Data Recorded  Assessment / Plan / Recommendation CHL IP CLINICAL IMPRESSIONS 04/05/2015  Therapy Diagnosis Mild oral phase dysphagia;Moderate pharyngeal phase  dysphagia;Severe pharyngeal phase dysphagia  Clinical Impression MBSS was completed.  The patient presented with mild  oral and mod-severe pharyngeal dysphagia characterized by motor sensotry  deficits.  The patient demonstrated delayed oral transit, poor bolus  cohesion with premature spill of material with some oral residuals,  delayed swallow trigger, decreased epiglottic inversion, poor airway  closure and decreased constriction with vallecular and pyriform residue.   Silent penetration of all textures was noted during the swallow.   Compensatory strategies were not attempted due to the patient's confusion.   Safest diet is NPO.  ST will follow for trial swallowing therapy which  will be dependent upon the patient's  ability to follow commands.        CHL IP TREATMENT  RECOMMENDATION 04/05/2015  Treatment Recommendations Therapy as outlined in treatment plan below     CHL IP DIET RECOMMENDATION 04/05/2015  SLP Diet Recommendations NPO  Liquid Administration via (None)  Medication Administration Via alternative means  Compensations (None)  Postural Changes and/or Swallow Maneuvers (None)     CHL IP OTHER RECOMMENDATIONS 04/05/2015  Recommended Consults (None)  Oral Care Recommendations Oral care QID  Other Recommendations (None)     No flowsheet data found.   CHL IP FREQUENCY AND DURATION 04/05/2015  Speech Therapy Frequency (ACUTE ONLY) min 2x/week  Treatment Duration 2 weeks     Pertinent Vitals/Pain No active distress.                        Lamar Sprinkles 04/05/2015, 4:22 PM  Shelly Flatten, MA, CCC-SLP Acute Rehab SLP [phone]       Assessment/Plan   ICD-9-CM ICD-10-CM   1. Acute GI bleeding, presumed diverticular etiology - RESOLVED s/p colectomy and ileostomy 578.9 K92.2   2. Dementia, with behavioral disturbance and psychotic behavior 294.21 F03.91   3. Essential hypertension -stable 401.9 I10   4. Acute blood loss anemia s/p multiple units of PRBCs - improved 285.1 D62   5. Diabetes mellitus type 2, controlled by diet 250.00 E11.9   6. Oropharyngeal dysphagia - due to #2 787.22 R13.12   7. Aspiration pneumonia, unspecified aspiration pneumonia type - resolved 507.0 J69.0   8. History of diverticulosis V12.79 Z87.19   9. History of prostate cancer V10.46 Z85.46     --cont current meds as ordered  --follow CBC w diff and BMP  --PT/OT/ST as ordered  --fall precautions/wanderguard  --cont nutritional supplement  --cont CBGs as ordered  --f/u with specialists as scheduled  --GOAL: short term rehab and d/c home when medically appropriate. Communicated with pt and nursing.  --will follow  Monica S. Perlie Gold  [facility] [address] [place], [place] [address] [phone] Cell  (Monday-Friday 8 AM - 5 PM) [phone] After 5 PM and follow prompts

## 2015-04-25 ENCOUNTER — Non-Acute Institutional Stay (SKILLED_NURSING_FACILITY): Payer: Medicare (Managed Care) | Admitting: Adult Health

## 2015-04-25 DIAGNOSIS — I10 Essential (primary) hypertension: Secondary | ICD-10-CM

## 2015-04-25 DIAGNOSIS — K922 Gastrointestinal hemorrhage, unspecified: Secondary | ICD-10-CM

## 2015-04-25 DIAGNOSIS — F0391 Unspecified dementia with behavioral disturbance: Secondary | ICD-10-CM

## 2015-04-28 ENCOUNTER — Encounter: Payer: Medicare (Managed Care) | Admitting: Family Medicine

## 2015-04-29 NOTE — Progress Notes (Signed)
This encounter was created in error - please disregard.

## 2015-05-01 ENCOUNTER — Telehealth: Payer: Self-pay

## 2015-05-01 NOTE — Telephone Encounter (Addendum)
[place] for patient to state that they would be starting his service today

## 2015-05-01 NOTE — Telephone Encounter (Deleted)
It  was not Olivia Mackie from [facility] not sure of name

## 2015-05-01 NOTE — Telephone Encounter (Signed)
error 

## 2015-05-02 ENCOUNTER — Inpatient Hospital Stay (HOSPITAL_COMMUNITY)
Admission: EM | Admit: 2015-05-02 | Discharge: 2015-05-27 | DRG: 919 | Disposition: E | Payer: Medicare (Managed Care) | Attending: Emergency Medicine | Admitting: Emergency Medicine

## 2015-05-02 ENCOUNTER — Emergency Department (HOSPITAL_COMMUNITY): Payer: Medicare (Managed Care)

## 2015-05-02 ENCOUNTER — Encounter (HOSPITAL_COMMUNITY): Payer: Self-pay | Admitting: Emergency Medicine

## 2015-05-02 DIAGNOSIS — Z932 Ileostomy status: Secondary | ICD-10-CM

## 2015-05-02 DIAGNOSIS — Z515 Encounter for palliative care: Secondary | ICD-10-CM | POA: Diagnosis present

## 2015-05-02 DIAGNOSIS — G9340 Encephalopathy, unspecified: Secondary | ICD-10-CM | POA: Diagnosis present

## 2015-05-02 DIAGNOSIS — R34 Anuria and oliguria: Secondary | ICD-10-CM | POA: Diagnosis present

## 2015-05-02 DIAGNOSIS — F0390 Unspecified dementia without behavioral disturbance: Secondary | ICD-10-CM | POA: Diagnosis present

## 2015-05-02 DIAGNOSIS — K567 Ileus, unspecified: Secondary | ICD-10-CM | POA: Diagnosis present

## 2015-05-02 DIAGNOSIS — J690 Pneumonitis due to inhalation of food and vomit: Secondary | ICD-10-CM | POA: Diagnosis present

## 2015-05-02 DIAGNOSIS — Y838 Other surgical procedures as the cause of abnormal reaction of the patient, or of later complication, without mention of misadventure at the time of the procedure: Secondary | ICD-10-CM | POA: Diagnosis present

## 2015-05-02 DIAGNOSIS — D62 Acute posthemorrhagic anemia: Secondary | ICD-10-CM | POA: Diagnosis present

## 2015-05-02 DIAGNOSIS — K922 Gastrointestinal hemorrhage, unspecified: Secondary | ICD-10-CM | POA: Diagnosis not present

## 2015-05-02 DIAGNOSIS — J9600 Acute respiratory failure, unspecified whether with hypoxia or hypercapnia: Secondary | ICD-10-CM | POA: Diagnosis present

## 2015-05-02 DIAGNOSIS — T8119XA Other postprocedural shock, initial encounter: Secondary | ICD-10-CM | POA: Diagnosis present

## 2015-05-02 DIAGNOSIS — I10 Essential (primary) hypertension: Secondary | ICD-10-CM | POA: Diagnosis present

## 2015-05-02 DIAGNOSIS — T68XXXA Hypothermia, initial encounter: Secondary | ICD-10-CM | POA: Diagnosis not present

## 2015-05-02 DIAGNOSIS — Z923 Personal history of irradiation: Secondary | ICD-10-CM

## 2015-05-02 DIAGNOSIS — Z8546 Personal history of malignant neoplasm of prostate: Secondary | ICD-10-CM

## 2015-05-02 DIAGNOSIS — R579 Shock, unspecified: Secondary | ICD-10-CM

## 2015-05-02 DIAGNOSIS — Z9049 Acquired absence of other specified parts of digestive tract: Secondary | ICD-10-CM

## 2015-05-02 DIAGNOSIS — E875 Hyperkalemia: Secondary | ICD-10-CM | POA: Diagnosis present

## 2015-05-02 DIAGNOSIS — K559 Vascular disorder of intestine, unspecified: Secondary | ICD-10-CM | POA: Diagnosis present

## 2015-05-02 DIAGNOSIS — E119 Type 2 diabetes mellitus without complications: Secondary | ICD-10-CM | POA: Diagnosis present

## 2015-05-02 DIAGNOSIS — J9601 Acute respiratory failure with hypoxia: Secondary | ICD-10-CM | POA: Diagnosis not present

## 2015-05-02 DIAGNOSIS — R58 Hemorrhage, not elsewhere classified: Secondary | ICD-10-CM | POA: Diagnosis present

## 2015-05-02 DIAGNOSIS — N179 Acute kidney failure, unspecified: Secondary | ICD-10-CM | POA: Diagnosis present

## 2015-05-02 DIAGNOSIS — Z978 Presence of other specified devices: Secondary | ICD-10-CM

## 2015-05-02 DIAGNOSIS — R578 Other shock: Secondary | ICD-10-CM | POA: Diagnosis present

## 2015-05-02 DIAGNOSIS — E871 Hypo-osmolality and hyponatremia: Secondary | ICD-10-CM | POA: Diagnosis present

## 2015-05-02 DIAGNOSIS — Z66 Do not resuscitate: Secondary | ICD-10-CM | POA: Diagnosis present

## 2015-05-02 DIAGNOSIS — E872 Acidosis: Secondary | ICD-10-CM | POA: Diagnosis present

## 2015-05-02 LAB — CBC WITH DIFFERENTIAL/PLATELET
BASOS ABS: 0 10*3/uL (ref 0.0–0.1)
Basophils Relative: 0 %
EOS PCT: 0 %
Eosinophils Absolute: 0 10*3/uL (ref 0.0–0.7)
HEMATOCRIT: 47.3 % (ref 39.0–52.0)
HEMOGLOBIN: 16 g/dL (ref 13.0–17.0)
LYMPHS ABS: 1.8 10*3/uL (ref 0.7–4.0)
Lymphocytes Relative: 6 %
MCH: 31.1 pg (ref 26.0–34.0)
MCHC: 33.8 g/dL (ref 30.0–36.0)
MCV: 92 fL (ref 78.0–100.0)
MONOS PCT: 7 %
Monocytes Absolute: 2 10*3/uL — ABNORMAL HIGH (ref 0.1–1.0)
NEUTROS ABS: 25.4 10*3/uL — AB (ref 1.7–7.7)
Neutrophils Relative %: 87 %
Platelets: 384 10*3/uL (ref 150–400)
RBC: 5.14 MIL/uL (ref 4.22–5.81)
RDW: 14.7 % (ref 11.5–15.5)
WBC: 29.2 10*3/uL — ABNORMAL HIGH (ref 4.0–10.5)

## 2015-05-02 LAB — COMPREHENSIVE METABOLIC PANEL
ALBUMIN: 2.5 g/dL — AB (ref 3.5–5.0)
ALK PHOS: 95 U/L (ref 38–126)
ALT: 52 U/L (ref 17–63)
ALT: 43 U/L (ref 17–63)
ANION GAP: 25 — AB (ref 5–15)
ANION GAP: 13 (ref 5–15)
AST: 42 U/L — ABNORMAL HIGH (ref 15–41)
AST: 43 U/L — ABNORMAL HIGH (ref 15–41)
Albumin: 4 g/dL (ref 3.5–5.0)
Alkaline Phosphatase: 64 U/L (ref 38–126)
BILIRUBIN TOTAL: 0.8 mg/dL (ref 0.3–1.2)
BUN: 110 mg/dL — ABNORMAL HIGH (ref 6–20)
BUN: 110 mg/dL — ABNORMAL HIGH (ref 6–20)
CALCIUM: 11.1 mg/dL — AB (ref 8.9–10.3)
CALCIUM: 8.5 mg/dL — AB (ref 8.9–10.3)
CO2: 7 mmol/L — ABNORMAL LOW (ref 22–32)
CO2: 12 mmol/L — AB (ref 22–32)
Chloride: 97 mmol/L — ABNORMAL LOW (ref 101–111)
Chloride: 109 mmol/L (ref 101–111)
Creatinine, Ser: 10.62 mg/dL — ABNORMAL HIGH (ref 0.61–1.24)
Creatinine, Ser: 9.37 mg/dL — ABNORMAL HIGH (ref 0.61–1.24)
GFR calc non Af Amer: 4 mL/min — ABNORMAL LOW (ref >60)
GFR calc non Af Amer: 5 mL/min — ABNORMAL LOW (ref >60)
GFR, EST AFRICAN AMERICAN: 5 mL/min — AB (ref >60)
GFR, EST AFRICAN AMERICAN: 6 mL/min — AB (ref >60)
GLUCOSE: 127 mg/dL — AB (ref 65–99)
Glucose, Bld: 215 mg/dL — ABNORMAL HIGH (ref 65–99)
POTASSIUM: 6.1 mmol/L — AB (ref 3.5–5.1)
Potassium: 5.7 mmol/L — ABNORMAL HIGH (ref 3.5–5.1)
SODIUM: 134 mmol/L — AB (ref 135–145)
Sodium: 129 mmol/L — ABNORMAL LOW (ref 135–145)
TOTAL PROTEIN: 8.6 g/dL — AB (ref 6.5–8.1)
Total Bilirubin: 1.3 mg/dL — ABNORMAL HIGH (ref 0.3–1.2)
Total Protein: 5.5 g/dL — ABNORMAL LOW (ref 6.5–8.1)

## 2015-05-02 LAB — ABO/RH: ABO/RH(D): O POS

## 2015-05-02 LAB — OCCULT BLOOD GASTRIC / DUODENUM (SPECIMEN CUP)
Occult Blood, Gastric: POSITIVE — AB
PH, GASTRIC: 2

## 2015-05-02 LAB — BLOOD GAS, ARTERIAL
Acid-base deficit: 23.9 mmol/L — ABNORMAL HIGH (ref 0.0–2.0)
Bicarbonate: 6.7 mEq/L — ABNORMAL LOW (ref 20.0–24.0)
DRAWN BY: 331471
FIO2: 1
O2 SAT: 98.9 %
PATIENT TEMPERATURE: 98.6
PEEP: 5 cmH2O
PH ART: 7.027 — AB (ref 7.350–7.450)
RATE: 20 resp/min
TCO2: 6.6 mmol/L (ref 0–100)
VT: 660 mL
pCO2 arterial: 26.9 mmHg — ABNORMAL LOW (ref 35.0–45.0)
pO2, Arterial: 301 mmHg — ABNORMAL HIGH (ref 80.0–100.0)

## 2015-05-02 LAB — PROTIME-INR
INR: 1.22 (ref 0.00–1.49)
Prothrombin Time: 15.5 seconds — ABNORMAL HIGH (ref 11.6–15.2)

## 2015-05-02 LAB — I-STAT CG4 LACTIC ACID, ED: Lactic Acid, Venous: 8.27 mmol/L (ref 0.5–2.0)

## 2015-05-02 LAB — CBC
HEMATOCRIT: 45.8 % (ref 39.0–52.0)
HEMOGLOBIN: 15 g/dL (ref 13.0–17.0)
MCH: 30.3 pg (ref 26.0–34.0)
MCHC: 32.8 g/dL (ref 30.0–36.0)
MCV: 92.5 fL (ref 78.0–100.0)
Platelets: 263 10*3/uL (ref 150–400)
RBC: 4.95 MIL/uL (ref 4.22–5.81)
RDW: 14.8 % (ref 11.5–15.5)
WBC: 17.7 10*3/uL — ABNORMAL HIGH (ref 4.0–10.5)

## 2015-05-02 LAB — GLUCOSE, CAPILLARY
GLUCOSE-CAPILLARY: 123 mg/dL — AB (ref 65–99)
GLUCOSE-CAPILLARY: 150 mg/dL — AB (ref 65–99)

## 2015-05-02 LAB — MRSA PCR SCREENING: MRSA by PCR: INVALID — AB

## 2015-05-02 LAB — TROPONIN I: TROPONIN I: 0.03 ng/mL (ref <0.031)

## 2015-05-02 LAB — PREPARE RBC (CROSSMATCH)

## 2015-05-02 MED ORDER — SODIUM POLYSTYRENE SULFONATE 15 GM/60ML PO SUSP
30.0000 g | Freq: Once | ORAL | Status: AC
  Administered 2015-05-02: 30 g via ORAL
  Filled 2015-05-02: qty 120

## 2015-05-02 MED ORDER — FENTANYL CITRATE (PF) 100 MCG/2ML IJ SOLN: 50.0000 ug | Freq: Once | INTRAMUSCULAR | Status: DC

## 2015-05-02 MED ORDER — SODIUM BICARBONATE 8.4 % IV SOLN
INTRAVENOUS | Status: AC
  Filled 2015-05-02: qty 50

## 2015-05-02 MED ORDER — NOREPINEPHRINE BITARTRATE 1 MG/ML IV SOLN
0.0000 ug/min | INTRAVENOUS | Status: DC
  Administered 2015-05-02: 20 ug/min via INTRAVENOUS
  Filled 2015-05-02: qty 4

## 2015-05-02 MED ORDER — EPINEPHRINE HCL 1 MG/ML IJ SOLN
0.5000 ug/min | INTRAVENOUS | Status: DC
  Filled 2015-05-02: qty 4

## 2015-05-02 MED ORDER — SODIUM CHLORIDE 0.9 % IV SOLN
25.0000 ug/h | INTRAVENOUS | Status: DC
  Administered 2015-05-02: 50 ug/h via INTRAVENOUS
  Filled 2015-05-02 (×2): qty 50

## 2015-05-02 MED ORDER — NOREPINEPHRINE BITARTRATE 1 MG/ML IV SOLN
20.0000 ug/min | Freq: Once | INTRAVENOUS | Status: DC
  Filled 2015-05-02: qty 4

## 2015-05-02 MED ORDER — ROCURONIUM BROMIDE 50 MG/5ML IV SOLN
INTRAVENOUS | Status: AC
  Filled 2015-05-02: qty 2

## 2015-05-02 MED ORDER — INSULIN ASPART 100 UNIT/ML ~~LOC~~ SOLN
0.0000 [IU] | SUBCUTANEOUS | Status: DC
  Administered 2015-05-02: 2 [IU] via SUBCUTANEOUS
  Administered 2015-05-03 (×2): 3 [IU] via SUBCUTANEOUS
  Administered 2015-05-03: 5 [IU] via SUBCUTANEOUS

## 2015-05-02 MED ORDER — MIDAZOLAM HCL 2 MG/2ML IJ SOLN
INTRAMUSCULAR | Status: AC
  Filled 2015-05-02: qty 2

## 2015-05-02 MED ORDER — DEXTROSE 5 % IV SOLN
20.0000 ug/min | Freq: Once | INTRAVENOUS | Status: AC
Start: 2015-05-02 — End: 2015-05-02
  Administered 2015-05-02: 20 ug/min via INTRAVENOUS
  Filled 2015-05-02: qty 16

## 2015-05-02 MED ORDER — NOREPINEPHRINE BITARTRATE 1 MG/ML IV SOLN
2.0000 ug/min | INTRAVENOUS | Status: DC
  Administered 2015-05-03 (×2): 50 ug/min via INTRAVENOUS
  Filled 2015-05-02 (×4): qty 16

## 2015-05-02 MED ORDER — SODIUM BICARBONATE 8.4 % IV SOLN
INTRAVENOUS | Status: DC
  Administered 2015-05-02 – 2015-05-03 (×3): via INTRAVENOUS
  Filled 2015-05-02 (×5): qty 150

## 2015-05-02 MED ORDER — PIPERACILLIN-TAZOBACTAM IN DEX 2-0.25 GM/50ML IV SOLN
2.2500 g | Freq: Three times a day (TID) | INTRAVENOUS | Status: DC
  Administered 2015-05-02 – 2015-05-03 (×3): 2.25 g via INTRAVENOUS
  Filled 2015-05-02 (×4): qty 50

## 2015-05-02 MED ORDER — VANCOMYCIN HCL IN DEXTROSE 1-5 GM/200ML-% IV SOLN: 1000.0000 mg | Freq: Once | INTRAVENOUS | Status: DC

## 2015-05-02 MED ORDER — SODIUM BICARBONATE 8.4 % IV SOLN
100.0000 meq | Freq: Once | INTRAVENOUS | Status: AC
  Administered 2015-05-02: 100 meq via INTRAVENOUS
  Filled 2015-05-02: qty 50

## 2015-05-02 MED ORDER — FENTANYL CITRATE (PF) 100 MCG/2ML IJ SOLN
100.0000 ug | Freq: Once | INTRAMUSCULAR | Status: DC
  Filled 2015-05-02: qty 2

## 2015-05-02 MED ORDER — PANTOPRAZOLE SODIUM 40 MG IV SOLR
40.0000 mg | Freq: Once | INTRAVENOUS | Status: AC
  Administered 2015-05-02: 40 mg via INTRAVENOUS
  Filled 2015-05-02: qty 40

## 2015-05-02 MED ORDER — SUCCINYLCHOLINE CHLORIDE 20 MG/ML IJ SOLN
INTRAMUSCULAR | Status: AC
  Filled 2015-05-02: qty 1

## 2015-05-02 MED ORDER — FENTANYL BOLUS VIA INFUSION
25.0000 ug | INTRAVENOUS | Status: DC | PRN
  Administered 2015-05-03: 25 ug via INTRAVENOUS
  Filled 2015-05-02: qty 25

## 2015-05-02 MED ORDER — LIDOCAINE HCL (CARDIAC) 20 MG/ML IV SOLN
INTRAVENOUS | Status: AC
  Filled 2015-05-02: qty 5

## 2015-05-02 MED ORDER — SODIUM CHLORIDE 0.9 % IV SOLN: 10.0000 mL/h | Freq: Once | INTRAVENOUS | Status: DC

## 2015-05-02 MED ORDER — DEXTROSE 5 % IV SOLN: INTRAVENOUS | Status: DC

## 2015-05-02 MED ORDER — NOREPINEPHRINE BITARTRATE 1 MG/ML IV SOLN
2.0000 ug/min | INTRAVENOUS | Status: DC
  Filled 2015-05-02: qty 4

## 2015-05-02 MED ORDER — PIPERACILLIN-TAZOBACTAM IN DEX 2-0.25 GM/50ML IV SOLN
2.2500 g | INTRAVENOUS | Status: DC
  Filled 2015-05-02: qty 50

## 2015-05-02 MED ORDER — ETOMIDATE 2 MG/ML IV SOLN
INTRAVENOUS | Status: AC
  Filled 2015-05-02: qty 20

## 2015-05-02 MED FILL — Medication: Qty: 1 | Status: AC

## 2015-05-02 NOTE — Progress Notes (Signed)
ANTIBIOTIC CONSULT NOTE - INITIAL  Pharmacy Consult for vancomycin, Zosyn Indication: pneumonia  No Known Allergies  Patient Measurements:     Vital Signs:   Intake/Output from previous day:   Intake/Output from this shift:    Labs:  Recent Labs  05/08/2015 1125  WBC 29.2*  HGB 16.0  PLT 384  CREATININE 10.62*   Estimated Creatinine Clearance: 6.5 mL/min (by C-G formula based on Cr of 10.62). No results for input(s): VANCOTROUGH, VANCOPEAK, VANCORANDOM, GENTTROUGH, GENTPEAK, GENTRANDOM, TOBRATROUGH, TOBRAPEAK, TOBRARND, AMIKACINPEAK, AMIKACINTROU, AMIKACIN in the last 72 hours.   Microbiology: No results found for this or any previous visit (from the past 720 hour(s)).  Medical History: Past Medical History  Diagnosis Date  . Memory loss   . Hypertension   . Diabetes mellitus without complication ([place])   . Diverticulosis     Of colon  . Hepatitis C     Virus has never been treated. No history of cirrhosis.  . Prostate cancer ([place]) ~ 1990s.    Treated with radiation seed implant.  . Hiatal hernia     Does not require meds for reflux symptoms.    Medications:  Scheduled:  . etomidate      . fentaNYL (SUBLIMAZE) injection  100 mcg Intravenous Once  . fentaNYL (SUBLIMAZE) injection  50 mcg Intravenous Once  . lidocaine (cardiac) 100 mg/73ml      . midazolam      . norepinephrine (LEVOPHED) Adult infusion  20 mcg/min Intravenous Once  . pantoprazole (PROTONIX) IV  40 mg Intravenous Once  . rocuronium      . succinylcholine       Infusions:  . sodium chloride    . fentaNYL infusion INTRAVENOUS 50 mcg/hr (05/23/2015 1241)   Assessment: 74 yo presents to ER with CC nonverbal, unresponsive and was intubated and placed on mechanical ventilation. Patient with recent hospitalization and discharged 9/21 after acute GI bleed resulting in hemorrhagic shock and was also treated for aspiration pneumonia. Ex lap with total colectomy performed on 9/3. Now starting  vancomycin and Zosyn per pharmacy dosing for HAP. Unknown temp, elevated WBC and acute renal failure with SCr of 10.62 and est CrCl of 6.5 ml/min at baseline  Goal of Therapy:  Vancomycin trough level 15-20 mcg/ml  Plan:  1) Due to renal failure, will give vancomycin 1g x 1 then check a random vanc level tomorrow AM to see if clearing drug at all 2) Zosyn 2.25g IV q8 for CrCl < 20 ml/min   Adrian Saran, PharmD, BCPS Pager [phone] 05/16/2015 1:26 PM

## 2015-05-02 NOTE — Progress Notes (Addendum)
Male at bedside states they moved here in April 2016 and has not obtain medical providers like pcp  CM noted pt has an appt in [facility] to meet with Sherrie Mustache of [facility] on 05/15/15 at 1330  CM entered this in [place] f/u section

## 2015-05-02 NOTE — ED Notes (Signed)
1346 4th bag of N/S given 1417 5th bag of N/S given

## 2015-05-02 NOTE — Progress Notes (Signed)
[place] Progress Note Patient Name: Jesse [name] DOB: 1941/03/11 MRN: [id number]   Date of Service  04/27/2015  HPI/Events of Note  K+ = 5.7 >> 6.1. ABG pH = 7.027 earlier.  eICU Interventions  Will order: 1. NaHCO3 100 meq IV now. 2. Kayexalate 30 gm via gastric tube now.      Intervention Category Major Interventions: Electrolyte abnormality - evaluation and management  Zelina Jimerson Eugene 05/21/2015, 7:55 PM

## 2015-05-02 NOTE — Progress Notes (Addendum)
Chaplain present with pt's spouse for support around ED admission.   Spouse states that during last admission, pt "just wanted to go home."  He was resistant to coming back into the hospital.  Jesse [name] related some guilt around not bringing him to hospital sooner.   Provided support around fear, guilt, uncertainty around plan of care.   Jesse [name] stated that Jesse [name] has dementia.  As this has progressed, he has been increasingly experiencing war-related memories and responses.  Described Jesse [name] as "ex-special forces" and related that he becomes frightened and combative when in hospital around people he does not know.    Jesse [name] was made DNR during last hospital admission.  As Jesse [name] spoke of this, she related now that he has made it this far on his journey, she wants to give him every chance to "be a Nurse, adult."  States she hopes he is able to recognize her and is able to relate to his surroundings.  Described feeling some guilt and not wishing to prolong his illness if he is not able to connect with her.    This family recently moved from [place] where most of their extended family still resides.  Jesse [name] has one daughter in [place].    [name], [place]

## 2015-05-02 NOTE — H&P (Signed)
PULMONARY / CRITICAL CARE MEDICINE   Name: Jesse [name] MRN: [id number] DOB: Feb 23, 1941    ADMISSION DATE:  04/27/2015 CONSULTATION DATE:  10/7  REFERRING MD :  EDP Mack[place]   CHIEF COMPLAINT:  Shock   INITIAL PRESENTATION:  74 year old demented male w/ recent h/o total colectomy on 9/3 c/b prolonged course which included: delirium, hemorrhagic shock, aspiration PNA, and post-op ileostomy bleeding. Was eventually discharged to SNF. Presents to the [facility] ER on 10/7 w/ wife reporting decreased LOC since d/c to home on 10/6. On arrival to ER he had agonal respirations, BP in 40s and what appeared to be frank blood from ostomy. He was intubated, CVL placed and resuscitation efforts initiated. PCCM asked to admit.   STUDIES:    SIGNIFICANT EVENTS: 10/7: spoke to wife. Verified DNR. No escalation of care.    HISTORY OF PRESENT ILLNESS:   See above.   PAST MEDICAL HISTORY :   has a past medical history of Memory loss; Hypertension; Diabetes mellitus without complication ([place]); Diverticulosis; Hepatitis C; Prostate cancer ([place]) (~ 1990s.); and Hiatal hernia.  has past surgical history that includes Laparoscopic cholecystectomy (~ 2003); Insertion prostate radiation seed (1990s); Esophagogastroduodenoscopy (N/A, 03/29/2015); Colectomy (N/A, 03/29/2015); and Cystoscopy (N/A, 03/29/2015). Prior to Admission medications   Medication Sig Start Date End Date Taking? Authorizing Provider  feeding supplement, ENSURE ENLIVE, (ENSURE ENLIVE) LIQD Take 237 mLs by mouth 2 (two) times daily between meals. 04/16/15   Verlee Monte, MD  lisinopril (PRINIVIL,ZESTRIL) 5 MG tablet Take 1 tablet (5 mg total) by mouth daily. 04/16/15   Verlee Monte, MD  QUEtiapine (SEROQUEL) 50 MG tablet Take 1 tablet (50 mg total) by mouth at bedtime. 04/16/15   Verlee Monte, MD  traZODone (DESYREL) 50 MG tablet Take 0.5 tablets (25 mg total) by mouth at bedtime. 04/16/15   Verlee Monte, MD   No Known Allergies  FAMILY HISTORY:  has no  family status information on file.  SOCIAL HISTORY:  reports that he has never smoked. He has never used smokeless tobacco. He reports that he drinks alcohol. He reports that he does not use illicit drugs.  REVIEW OF SYSTEMS:   Unable   SUBJECTIVE:  Sedated on vent  VITAL SIGNS: Pulse Rate:  [67] 67 (10/07 1332) Resp:  [24] 24 (10/07 1332) BP: (52)/(26) 52/26 mmHg (10/07 1332) SpO2:  [100 %] 100 % (10/07 1332) FiO2 (%):  [50 %-100 %] 50 % (10/07 1324) HEMODYNAMICS:   VENTILATOR SETTINGS: Vent Mode:  [-] PRVC FiO2 (%):  [50 %-100 %] 50 % Set Rate:  [20 bmp] 20 bmp Vt Set:  [924 mL] 660 mL PEEP:  [5 cmH20] 5 cmH20 Plateau Pressure:  [14 cmH20] 14 cmH20 INTAKE / OUTPUT: No intake or output data in the 24 hours ending 05/24/2015 1349  PHYSICAL EXAMINATION: General:  Chronically ill appearing male, now acutely ill and unresponsive on vent  Neuro:  GCS4, moves to noxious stim only  HEENT:  Mucous membranes are dry, neck veins are flat. Orally intubated  Cardiovascular:  rrr Lungs:  Scattered rhonchi.  Abdomen:  Soft, ostomy w/ what appears to be frank blood.  Musculoskeletal:  Deconditioned.  Skin:  Intact, dry   LABS:  CBC  Recent Labs Lab 05/04/2015 1125  WBC 29.2*  HGB 16.0  HCT 47.3  PLT 384   Coag's  Recent Labs Lab 05/16/2015 1125  INR 1.22   BMET  Recent Labs Lab 05/08/2015 1125  NA 129*  K 5.7*  CL 97*  CO2 7*  BUN 110*  CREATININE 10.62*  GLUCOSE 215*   Electrolytes  Recent Labs Lab 04/27/2015 1125  CALCIUM 11.1*   Sepsis Markers  Recent Labs Lab 05/05/2015 1128  LATICACIDVEN 8.27*   ABG  Recent Labs Lab 05/15/2015 1302  PHART 7.027*  PCO2ART 26.9*  PO2ART 301*   Liver Enzymes  Recent Labs Lab 04/26/2015 1125  AST 42*  ALT 52  ALKPHOS 95  BILITOT 0.8  ALBUMIN 4.0   Cardiac Enzymes  Recent Labs Lab 05/01/2015 1125  TROPONINI 0.03   Glucose No results for input(s): GLUCAP in the last 168 hours.  Imaging Dg Chest Portable  1 View  05/09/2015   CLINICAL DATA:  Central line placement, hypertension, diabetes mellitus, prostate cancer  EXAM: PORTABLE CHEST 1 VIEW  COMPARISON:  Portable exam 1245 hours compared to 1140 hours  FINDINGS: Tip of endotracheal tube projects 6.8 cm above carina.  Nasogastric tube extends to least [place] inferior mediastinum beyond extent of exam.  New LEFT jugular central venous catheter with tip projecting over LEFT brachiocephalic vein near SVC confluence.  Normal heart size, mediastinal contours and pulmonary vascularity.  Exclusion of LEFT lung base and RIGHT costophrenic angle.  RIGHT lower lobe infiltrate questioned.  Remaining lungs clear.  No pneumothorax.  IMPRESSION: No pneumothorax following LEFT jugular line placement.  Question developing RIGHT lower lobe infiltrate/pneumonia.   Electronically Signed   By: Lavonia Dana M.D.   On: 04/27/2015 12:51   Dg Chest Port 1 View  05/24/2015   CLINICAL DATA:  Acute respiratory failure, check endotracheal tube placement  EXAM: PORTABLE CHEST - 1 VIEW  COMPARISON:  04/04/2015  FINDINGS: Cardiac shadow is stable. An endotracheal tube is now seen just above the aortic knob approximately 8.8 cm above the carina. A nasogastric catheter extends into the stomach. No focal infiltrate or sizable effusion is seen. No bony abnormality is noted.  IMPRESSION: Tubes and lines as described above.  No acute abnormality seen.   Electronically Signed   By: Inez Catalina M.D.   On: 05/14/2015 11:55     ASSESSMENT / PLAN:  PULMONARY OETT 10/7>>> A: Acute respiratory failure  P:   Full vent support F/u abg PAD protocol   CARDIOVASCULAR CVL left IJ CVL 10/7>> A:  Hemorrhagic shock c/b cardiogenic component driven by acidosis   P:  Transfuse 2 units PRBCs stat Currently on levo/vaso and epi..Nothing further to add Repeat CBC after xfusion Full DNR  RENAL A:   AKI Severe lactic acidosis  Hyperkalemia  P:   Bicarb gtt Serial chemistries  Strict  I&O  GASTROINTESTINAL A:   Acute LGIB. Had recent total colectomy d/t diverticular disease and massive bleeding in September  Probable ischemic bowel P:   See Heme section NPO GI called. PPI  HEMATOLOGIC A:   Acute blood loss anemia  P:  Transfuse as indicated Trend cbc PAS   INFECTIOUS A:   Possible ischemic gut  P:   BCx2 10/7>>> Zosyn 10/7>>>  ENDOCRINE A:   DM P:   ssi   NEUROLOGIC A:   Dementia  Acute encephalopathy  P:   RASS goal: -2 PAD protocol    FAMILY  - Updates: wife at bedside. Confirmed DNR.   - Inter-disciplinary family meet or Palliative Care meeting due by: 10/17    TODAY'S SUMMARY:  Acute LGIB. ? Ischemia vs anastomotic sit bleed? Was full DNR before w/ palliative following. Spoke w/ wife. Prognosis poor. Not strong enough to survive another surgery. We  will cont current supportive care. No further titrations. Anticipate he will not survive the afternoon.   Erick Colace ACNP-BC [place] Pager # [phone] OR # [phone] if no answer  04/28/2015, 1:49 PM  Attending Note:  I have examined patient, reviewed labs, studies and notes. I have discussed the case with Jerrye Bushy, and I agree with the data and plans as amended above. Pt with hx dementia, recent complicated hospitalization for GIB and shock requiring partial colectomy. He returns with encephalopathy, shock, respiratory failure in setting recurrent GI bleeding. He has been intubated, is on pressors and is receiving blood products to attempt to stabilize. He remains labile - BP in 50's and poorly responsive on MV. Unfortunately I believe that options are limited here. Unclear whether there has been an anastomotic leak, new ischemia, but in any event he would not tolerate another abdominal surgery. We will attempt to stabilize with blood and current level of support. His wife understands that despite these efforts that he may not survive. Next several hours will be  critical. Independent critical care time is 60 minutes.   Baltazar Apo, MD, PhD 05/17/2015, 2:39 PM [facility] [phone] or if no answer [phone]

## 2015-05-02 NOTE — ED Provider Notes (Addendum)
CSN: [id number]     Arrival date & time 05/18/2015  1107 History   First MD Initiated Contact with Patient 05/16/2015 1122     Chief Complaint  Patient presents with  . Respiratory Arrest     (Consider location/radiation/quality/duration/timing/severity/associated sxs/prior Treatment) HPI  Patient is a 74 year old male who came in unresponsive. EMS initially said that the wife said that this was his baseline. However patient was unresponsive to painful stimuli. His blood pressure was unmeasurable. He still had normal heart rate and normal pulses.   Level 5 caveat: AMS   Past Medical History  Diagnosis Date  . Memory loss   . Hypertension   . Diabetes mellitus without complication ([place])   . Diverticulosis     Of colon  . Hepatitis C     Virus has never been treated. No history of cirrhosis.  . Prostate cancer ([place]) ~ 1990s.    Treated with radiation seed implant.  . Hiatal hernia     Does not require meds for reflux symptoms.   Past Surgical History  Procedure Laterality Date  . Laparoscopic cholecystectomy  ~ 2003  . Insertion prostate radiation seed  1990s  . Esophagogastroduodenoscopy N/A 03/29/2015    Procedure: ESOPHAGOGASTRODUODENOSCOPY (EGD);  Surgeon: Jerene Bears, MD;  Location: [facility] ENDOSCOPY;  Service: Endoscopy;  Laterality: N/A;  . Colectomy N/A 03/29/2015    Procedure: EXPLORATORY LAP WITH TOTAL COLECTOMY;  Surgeon: Ralene Ok, MD;  Location: [place];  Service: General;  Laterality: N/A;  . Cystoscopy N/A 03/29/2015    Procedure: CYSTOSCOPY WITH PLACEMENT OF [place].;  Surgeon: Ralene Ok, MD;  Location: [place];  Service: General;  Laterality: N/A;   History reviewed. No pertinent family history. Social History  Substance Use Topics  . Smoking status: Never Smoker   . Smokeless tobacco: Never Used  . Alcohol Use: Yes     Comment: occ    Review of Systems  Unable to perform ROS: Mental status change      Allergies  Review of patient's  allergies indicates no known allergies.  Home Medications   Prior to Admission medications   Medication Sig Start Date End Date Taking? Authorizing Provider  feeding supplement, ENSURE ENLIVE, (ENSURE ENLIVE) LIQD Take 237 mLs by mouth 2 (two) times daily between meals. 04/16/15   Verlee Monte, MD  lisinopril (PRINIVIL,ZESTRIL) 5 MG tablet Take 1 tablet (5 mg total) by mouth daily. 04/16/15   Verlee Monte, MD  QUEtiapine (SEROQUEL) 50 MG tablet Take 1 tablet (50 mg total) by mouth at bedtime. 04/16/15   Verlee Monte, MD  traZODone (DESYREL) 50 MG tablet Take 0.5 tablets (25 mg total) by mouth at bedtime. 04/16/15   Mutaz Elmahi, MD   BP 50/30 mmHg  Pulse 105  Resp 24  SpO2 99% Physical Exam  HENT:  Head: Normocephalic.  Eyes: Conjunctivae are normal.  Neck: Neck supple.  Cardiovascular: Normal rate.   No murmur heard. Pulmonary/Chest: Effort normal. No respiratory distress.  Abdominal: There is tenderness.  Musculoskeletal: He exhibits no edema.  Neurological:  Pt intubated on arrival  Skin: Skin is warm. No rash noted. He is not diaphoretic.    ED Course  .Central Line Date/Time: 05/02/2015 2:00 PM Performed by: Zenovia Jarred LYN Authorized by: Zenovia Jarred LYN Consent: Verbal consent obtained. Risks and benefits: risks, benefits and alternatives were discussed Consent given by: spouse Patient understanding: patient states understanding of the procedure being performed Test results: test results not available Site marked: the  operative site was not marked Imaging studies: imaging studies not available Patient identity confirmed: hospital-assigned identification number Time out: Immediately prior to procedure a "time out" was called to verify the correct patient, procedure, equipment, support staff and site/side marked as required. Indications: vascular access Anesthesia: local infiltration Local anesthetic: lidocaine 1% with epinephrine Patient sedated:  no Preparation: skin prepped with Betadine Skin prep agent dried: skin prep agent completely dried prior to procedure Sterile barriers: all five maximum sterile barriers used - cap, mask, sterile gown, sterile gloves, and large sterile sheet Hand hygiene: hand hygiene performed prior to central venous catheter insertion Location details: left internal jugular Patient position: Trendelenburg Catheter type: triple lumen Pre-procedure: landmarks identified Ultrasound guidance: yes Sterile ultrasound techniques: sterile gel and sterile probe covers were used Number of attempts: 1 Successful placement: yes Post-procedure: line sutured Assessment: blood return through all ports,  free fluid flow and placement verified by x-ray Patient tolerance: Patient tolerated the procedure well with no immediate complications   (including critical care time) Labs Review Labs Reviewed  CBC WITH DIFFERENTIAL/PLATELET - Abnormal; Notable for the following:    WBC 29.2 (*)    Neutro Abs 25.4 (*)    Monocytes Absolute 2.0 (*)    All other components within normal limits  COMPREHENSIVE METABOLIC PANEL - Abnormal; Notable for the following:    Sodium 129 (*)    Potassium 5.7 (*)    Chloride 97 (*)    CO2 7 (*)    Glucose, Bld 215 (*)    BUN 110 (*)    Creatinine, Ser 10.62 (*)    Calcium 11.1 (*)    Total Protein 8.6 (*)    AST 42 (*)    GFR calc non Af Amer 4 (*)    GFR calc Af Amer 5 (*)    Anion gap 25 (*)    All other components within normal limits  PROTIME-INR - Abnormal; Notable for the following:    Prothrombin Time 15.5 (*)    All other components within normal limits  OCCULT BLOOD GASTRIC / DUODENUM (SPECIMEN CUP) - Abnormal; Notable for the following:    Occult Blood, Gastric POSITIVE (*)    All other components within normal limits  BLOOD GAS, ARTERIAL - Abnormal; Notable for the following:    pH, Arterial 7.027 (*)    pCO2 arterial 26.9 (*)    pO2, Arterial 301 (*)    Bicarbonate  6.7 (*)    Acid-base deficit 23.9 (*)    All other components within normal limits  I-STAT CG4 LACTIC ACID, ED - Abnormal; Notable for the following:    Lactic Acid, Venous 8.27 (*)    All other components within normal limits  CULTURE, BLOOD (ROUTINE X 2)  CULTURE, BLOOD (ROUTINE X 2)  MRSA PCR SCREENING  TROPONIN I  COMPREHENSIVE METABOLIC PANEL  CBC  CBC  I-STAT CG4 LACTIC ACID, ED  TYPE AND SCREEN  ABO/RH  PREPARE RBC (CROSSMATCH)    Imaging Review Dg Chest Portable 1 View  05/09/2015   CLINICAL DATA:  Central line placement, hypertension, diabetes mellitus, prostate cancer  EXAM: PORTABLE CHEST 1 VIEW  COMPARISON:  Portable exam 1245 hours compared to 1140 hours  FINDINGS: Tip of endotracheal tube projects 6.8 cm above carina.  Nasogastric tube extends to least Celsius inferior mediastinum beyond extent of exam.  New LEFT jugular central venous catheter with tip projecting over LEFT brachiocephalic vein near SVC confluence.  Normal heart size, mediastinal contours and pulmonary vascularity.  Exclusion of LEFT lung base and RIGHT costophrenic angle.  RIGHT lower lobe infiltrate questioned.  Remaining lungs clear.  No pneumothorax.  IMPRESSION: No pneumothorax following LEFT jugular line placement.  Question developing RIGHT lower lobe infiltrate/pneumonia.   Electronically Signed   By: Lavonia Dana M.D.   On: 05/17/2015 12:51   Dg Chest Port 1 View  05/12/2015   CLINICAL DATA:  Acute respiratory failure, check endotracheal tube placement  EXAM: PORTABLE CHEST - 1 VIEW  COMPARISON:  04/04/2015  FINDINGS: Cardiac shadow is stable. An endotracheal tube is now seen just above the aortic knob approximately 8.8 cm above the carina. A nasogastric catheter extends into the stomach. No focal infiltrate or sizable effusion is seen. No bony abnormality is noted.  IMPRESSION: Tubes and lines as described above.  No acute abnormality seen.   Electronically Signed   By: Inez Catalina M.D.   On:  04/29/2015 11:55   I have personally reviewed and evaluated these images and lab results as part of my medical decision-making.   EKG Interpretation None      MDM   Final diagnoses:  Gastrointestinal hemorrhage, unspecified gastritis, unspecified gastrointestinal hemorrhage type    Patient's blood pressure initially below 50 systolic. Portions of a epinephrine ampule were given in order to raise patient's blood pressure above 80 systolic in order to push RSI drugs.  Patient's blood pressure read 140/100 right after last epinephrine dose before pushing succinylcholine and etomidate.  Patient intubated on first pass. Patient continued to have low blood pressures. Levo started.   Central line was placed and a left IJ. Confirmed by x-ray.  We'll place a central line it was noted that dark black material was coming out from the patient's stomach.   Patient continued to have low blood pressures so vasopressin started.  2 units of blood ordered.  Patient continued to be hypotensive despite 2 pressors, epinephrine started. Multiple conversations had with patient's wife. Decision made in the ED not to resuscitate patient if he were to have a cardiac arrest.  Crit care called, case discussed. Admitted.  CRITICAL CARE Performed by: Gardiner Sleeper Total critical care time: 1.5 hours Critical care time was exclusive of separately billable procedures and treating other patients. Critical care was necessary to treat or prevent imminent or life-threatening deterioration. Critical care was time spent personally by me on the following activities: development of treatment plan with patient and/or surrogate as well as nursing, discussions with consultants, evaluation of patient's response to treatment, examination of patient, obtaining history from patient or surrogate, ordering and performing treatments and interventions, ordering and review of laboratory studies, ordering and review of  radiographic studies, pulse oximetry and re-evaluation of patient's condition.   INTUBATION Performed by: Gardiner Sleeper  Required items: required blood products, implants, devices, and special equipment available Patient identity confirmed: provided demographic data and hospital-assigned identification number Time out: Immediately prior to procedure a "time out" was called to verify the correct patient, procedure, equipment, support staff and site/side marked as required.  Indications: Respiratory arrest Intubation method:Glidescope Laryngoscopy   Preoxygenation: BVM  Sedatives:Etomidate Paralytic:Succinylcholine  Tube Size: 7.5  cuffed  Post-procedure assessment: chest rise and ETCO2 monitor Breath sounds: equal and absent over the epigastrium Tube secured with: ETT holder Chest x-ray interpreted by radiologist and me.  Chest x-ray findings: endotracheal tube in appropriate position  Patient tolerated the procedure well with no immediate complications.      Tomasina Keasling Julio Alm, MD 04/29/2015 [place]  Julio Alm, MD 05/22/2015 [place], MD 05/01/2015 [place], MD 06/07/15 [phone]

## 2015-05-02 NOTE — ED Notes (Signed)
Intubation: 1121 epi given 0.5 b/p 75/37 1124 b/p 88/44 1127 9ml epi given b/p 44/28 1128 2 ml epi given 1130 20 mg etomidate b/p 132/105 1132 120 mg succinylcholine  1133 pt intubated 25 @ teeth 1136 b/p 62/38  1136 2 ml epi given 1156 2mg  versed given 1159 N/S bolus 2nd bag (pressure) 1207 100 mcg 1139 Levophed started at 20 mcg/min 1227 N/S 3rd bag started 3646 OEH placed

## 2015-05-02 NOTE — Progress Notes (Signed)
[place] Progress Note Patient Name: Jesse [name] DOB: 26-Aug-1940 MRN: [id number]   Date of Service  05/21/2015  HPI/Events of Note  Multiple issues: 1. Hypothermia and 2. Oliguria. BP = 125/94. No CVP available.   eICU Interventions  Will order: 1. [facility]. 2. Monitor CVP. If < 10 >> fluids. If 10 - 14, observe. If >= 15 >> Lasix.      Intervention Category Intermediate Interventions: Other:;Oliguria - evaluation and management  [name] 05/23/2015, 6:42 PM

## 2015-05-02 NOTE — Consult Note (Signed)
Referring Provider: [facility] Primary Care Physician:  No primary care provider on file. Primary Gastroenterologist:  unassigned  Reason for Consultation:  Hematochezia via iliostomy   HPI: Jesse [name] is a 74 y.o. male s/p admission to [facility] in Sept for massive LGIB . He had an EGD that showed no source of bleeding.He was admitted to ICU and was seen by surgery and underwent a complete colectomy and ileostomy( due to diverticular bleed).Olene Floss op course complicate with aspiration pneumonia, resp failure, altered mental status.  He was discharged home 04/16/15. Pts wife says he has been having dark/black stool via iliostomy since [place] she thought initially it might be due to something he ate. The stools have been darker, and pt was very lethagic yesterday. Today pt's wife says he began to have red blood through iliostomy and was unresponsive, so she called EMS. IN ER, pt has been hypotensive, tachy, with agonal breathing.    Past Medical History  Diagnosis Date  . Memory loss   . Hypertension   . Diabetes mellitus without complication ([place])   . Diverticulosis     Of colon  . Hepatitis C     Virus has never been treated. No history of cirrhosis.  . Prostate cancer ([place]) ~ 1990s.    Treated with radiation seed implant.  . Hiatal hernia     Does not require meds for reflux symptoms.    Past Surgical History  Procedure Laterality Date  . Laparoscopic cholecystectomy  ~ 2003  . Insertion prostate radiation seed  1990s  . Esophagogastroduodenoscopy N/A 03/29/2015    Procedure: ESOPHAGOGASTRODUODENOSCOPY (EGD);  Surgeon: Jerene Bears, MD;  Location: [facility] ENDOSCOPY;  Service: Endoscopy;  Laterality: N/A;  . Colectomy N/A 03/29/2015    Procedure: EXPLORATORY LAP WITH TOTAL COLECTOMY;  Surgeon: Ralene Ok, MD;  Location: [place];  Service: General;  Laterality: N/A;  . Cystoscopy N/A 03/29/2015    Procedure: CYSTOSCOPY WITH PLACEMENT OF [place].;  Surgeon: Ralene Ok, MD;   Location: [place];  Service: General;  Laterality: N/A;    Prior to Admission medications   Medication Sig Start Date End Date Taking? Authorizing Provider  feeding supplement, ENSURE ENLIVE, (ENSURE ENLIVE) LIQD Take 237 mLs by mouth 2 (two) times daily between meals. 04/16/15   Verlee Monte, MD  lisinopril (PRINIVIL,ZESTRIL) 5 MG tablet Take 1 tablet (5 mg total) by mouth daily. 04/16/15   Verlee Monte, MD  QUEtiapine (SEROQUEL) 50 MG tablet Take 1 tablet (50 mg total) by mouth at bedtime. 04/16/15   Verlee Monte, MD  traZODone (DESYREL) 50 MG tablet Take 0.5 tablets (25 mg total) by mouth at bedtime. 04/16/15   Verlee Monte, MD    Current Facility-Administered Medications  Medication Dose Route Frequency Provider Last Rate Last Dose  . 0.9 %  sodium chloride infusion  10 mL/hr Intravenous Once Courteney Lyn Mackuen, MD      . EPINEPHrine (ADRENALIN) 4 mg in dextrose 5 % 250 mL (0.016 mg/mL) infusion  0.5-20 mcg/min Intravenous Titrated Courteney Lyn Mackuen, MD      . etomidate (AMIDATE) 2 MG/ML injection           . fentaNYL (SUBLIMAZE) 2,500 mcg in sodium chloride 0.9 % 250 mL (10 mcg/mL) infusion  25-400 mcg/hr Intravenous Continuous Courteney Lyn Mackuen, MD 5 mL/hr at 05/14/2015 1241 50 mcg/hr at 05/13/2015 1241  . fentaNYL (SUBLIMAZE) bolus via infusion 25 mcg  25 mcg Intravenous Q1H PRN Courteney Lyn Mackuen, MD      .  fentaNYL (SUBLIMAZE) injection 100 mcg  100 mcg Intravenous Once Courteney Lyn Mackuen, MD      . fentaNYL (SUBLIMAZE) injection 50 mcg  50 mcg Intravenous Once Courteney Lyn Mackuen, MD      . lidocaine (cardiac) 100 mg/63ml (XYLOCAINE) 20 MG/ML injection 2%           . midazolam (VERSED) 2 MG/2ML injection           . norepinephrine (LEVOPHED) 4 mg in dextrose 5 % 250 mL (0.016 mg/mL) infusion  20 mcg/min Intravenous Once Courteney Lyn Mackuen, MD      . pantoprazole (PROTONIX) injection 40 mg  40 mg Intravenous Once Courteney Lyn Mackuen, MD      . piperacillin-tazobactam  (ZOSYN) IVPB 2.25 g  2.25 g Intravenous STAT Adrian Saran, [facility]      . piperacillin-tazobactam (ZOSYN) IVPB 2.25 g  2.25 g Intravenous 3 times per day Adrian Saran, [facility]      . rocuronium (ZEMURON) 50 MG/5ML injection           . sodium bicarbonate 150 mEq in dextrose 5 % 1,000 mL infusion   Intravenous Continuous Erick Colace, NP      . succinylcholine (ANECTINE) 20 MG/ML injection           . vancomycin (VANCOCIN) IVPB 1000 mg/200 mL premix  1,000 mg Intravenous Once Adrian Saran, [facility]       Current Outpatient Prescriptions  Medication Sig Dispense Refill  . feeding supplement, ENSURE ENLIVE, (ENSURE ENLIVE) LIQD Take 237 mLs by mouth 2 (two) times daily between meals. 237 mL 12  . lisinopril (PRINIVIL,ZESTRIL) 5 MG tablet Take 1 tablet (5 mg total) by mouth daily.    . QUEtiapine (SEROQUEL) 50 MG tablet Take 1 tablet (50 mg total) by mouth at bedtime.    . traZODone (DESYREL) 50 MG tablet Take 0.5 tablets (25 mg total) by mouth at bedtime.      Allergies as of 05/17/2015  . (No Known Allergies)    History reviewed. No pertinent family history.  Social History   Social History  . Marital Status: Married    Spouse Name: N/A  . Number of Children: N/A  . Years of Education: N/A   Occupational History  . Not on file.   Social History Main Topics  . Smoking status: Never Smoker   . Smokeless tobacco: Never Used  . Alcohol Use: Yes     Comment: occ  . Drug Use: No  . Sexual Activity: No   Other Topics Concern  . 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    Review of Systems: Unable to obtain--pt intubated and unresponsive.  Physical Exam: Vital signs in last 24 hours: Pulse Rate:  [67] 67 (10/07 1332) Resp:  [24] 24 (10/07 1332) BP: (52)/(26) 52/26 mmHg (10/07 1332) SpO2:  [100 %] 100 % (10/07 1332) FiO2 (%):  [50 %-100 %] 50 % (10/07 1324)   General:   Intubated, unresponsive, ill appearing  male  Neck:  Supple; no masses or thyromegaly. Lungs:  Rhonchi bilat  Heart:  Regular rate and rhythm Abdomen:  Soft,nontender,ostomy bag full of blood   Rectal:  Deferred . Extremities: Without clubbing or edema. Neurologic:unresponsive   Intake/Output from previous day:   Intake/Output this shift:    Lab Results:  Recent Labs  05/09/2015 1125  WBC 29.2*  HGB 16.0  HCT  47.3  PLT 384   BMET  Recent Labs  05/08/2015 1125  NA 129*  K 5.7*  CL 97*  CO2 7*  GLUCOSE 215*  BUN 110*  CREATININE 10.62*  CALCIUM 11.1*   LFT  Recent Labs  05/13/2015 1125  PROT 8.6*  ALBUMIN 4.0  AST 42*  ALT 52  ALKPHOS 95  BILITOT 0.8   PT/INR  Recent Labs  05/13/2015 1125  LABPROT 15.5*  INR 1.22    Studies/Results: Dg Chest Portable 1 View  05/16/2015   CLINICAL DATA:  Central line placement, hypertension, diabetes mellitus, prostate cancer  EXAM: PORTABLE CHEST 1 VIEW  COMPARISON:  Portable exam 1245 hours compared to 1140 hours  FINDINGS: Tip of endotracheal tube projects 6.8 cm above carina.  Nasogastric tube extends to least Celsius inferior mediastinum beyond extent of exam.  New LEFT jugular central venous catheter with tip projecting over LEFT brachiocephalic vein near SVC confluence.  Normal heart size, mediastinal contours and pulmonary vascularity.  Exclusion of LEFT lung base and RIGHT costophrenic angle.  RIGHT lower lobe infiltrate questioned.  Remaining lungs clear.  No pneumothorax.  IMPRESSION: No pneumothorax following LEFT jugular line placement.  Question developing RIGHT lower lobe infiltrate/pneumonia.   Electronically Signed   By: Lavonia Dana M.D.   On: 05/16/2015 12:51   Dg Chest Port 1 View  05/11/2015   CLINICAL DATA:  Acute respiratory failure, check endotracheal tube placement  EXAM: PORTABLE CHEST - 1 VIEW  COMPARISON:  04/04/2015  FINDINGS: Cardiac shadow is stable. An endotracheal tube is now seen just above the aortic knob approximately 8.8 cm above the  carina. A nasogastric catheter extends into the stomach. No focal infiltrate or sizable effusion is seen. No bony abnormality is noted.  IMPRESSION: Tubes and lines as described above.  No acute abnormality seen.   Electronically Signed   By: Inez Catalina M.D.   On: 05/20/2015 11:55    IMPRESSION/PLAN: 74 year old male admitted with massive  GI bleed . Patient is status post recent total colectomy secondary diverticular disease in September. Patient has had melena since Monday and was found with bright red blood per ileostomy , unresponsive. Current bleeding likely ischemic or possibly due to anastomotic site. Patient currently intubated,in hemorrhagic shock, with a central line, on 3 pressors but remains hypotensive. Has rec 2 units prbc thus far. If patient stabilized, would likely  need arteriogram.Per Critical Care, pt has been made a DNR.    Brae Schaafsma, Deloris Ping 05/06/2015,  Pager [phone]

## 2015-05-02 NOTE — Progress Notes (Signed)
[place] Progress Note Patient Name: Jesse [name] DOB: 1940/09/16 MRN: [id number]   Date of Service  05/23/2015  HPI/Events of Note  Hypotension - BP = 86/39. Was on Norepinephrine IV infusion. Order lost on transfer from ED.   eICU Interventions  Will reorder Norepinephrine IV infusion.      Intervention Category Major Interventions: Hypotension - evaluation and management  Marlinda Miranda Eugene 05/13/2015, 5:28 PM

## 2015-05-02 NOTE — ED Notes (Signed)
Per EMS: Pt from home.  Recently dc from a rehab facility after a surgery for colostomy.  Wife states that pt has been nonverbal, unresponsive x 1 wk.  Upon arrival, pt not protecting airway, unresponsive.  Dr. Thomasene Lot called to bedside.  Decision to made to intubate.

## 2015-05-03 LAB — GLUCOSE, CAPILLARY
GLUCOSE-CAPILLARY: 155 mg/dL — AB (ref 65–99)
GLUCOSE-CAPILLARY: 168 mg/dL — AB (ref 65–99)
GLUCOSE-CAPILLARY: 177 mg/dL — AB (ref 65–99)
Glucose-Capillary: 215 mg/dL — ABNORMAL HIGH (ref 65–99)

## 2015-05-03 LAB — COMPREHENSIVE METABOLIC PANEL
ALBUMIN: 2.5 g/dL — AB (ref 3.5–5.0)
ALK PHOS: 63 U/L (ref 38–126)
ALT: 46 U/L (ref 17–63)
AST: 47 U/L — AB (ref 15–41)
Anion gap: 18 — ABNORMAL HIGH (ref 5–15)
BILIRUBIN TOTAL: 1.6 mg/dL — AB (ref 0.3–1.2)
BUN: 103 mg/dL — AB (ref 6–20)
CALCIUM: 8.6 mg/dL — AB (ref 8.9–10.3)
CO2: 20 mmol/L — ABNORMAL LOW (ref 22–32)
CREATININE: 8.88 mg/dL — AB (ref 0.61–1.24)
Chloride: 100 mmol/L — ABNORMAL LOW (ref 101–111)
GFR calc Af Amer: 6 mL/min — ABNORMAL LOW (ref >60)
GFR calc non Af Amer: 5 mL/min — ABNORMAL LOW (ref >60)
GLUCOSE: 154 mg/dL — AB (ref 65–99)
Potassium: 4.5 mmol/L (ref 3.5–5.1)
Sodium: 138 mmol/L (ref 135–145)
TOTAL PROTEIN: 5.9 g/dL — AB (ref 6.5–8.1)

## 2015-05-03 LAB — CBC
HEMATOCRIT: 46.1 % (ref 39.0–52.0)
HEMATOCRIT: 44.7 % (ref 39.0–52.0)
HEMOGLOBIN: 15.5 g/dL (ref 13.0–17.0)
HEMOGLOBIN: 15.5 g/dL (ref 13.0–17.0)
MCH: 30 pg (ref 26.0–34.0)
MCH: 30.5 pg (ref 26.0–34.0)
MCHC: 33.6 g/dL (ref 30.0–36.0)
MCHC: 34.7 g/dL (ref 30.0–36.0)
MCV: 87.8 fL (ref 78.0–100.0)
MCV: 89.2 fL (ref 78.0–100.0)
Platelets: 235 10*3/uL (ref 150–400)
Platelets: 271 10*3/uL (ref 150–400)
RBC: 5.09 MIL/uL (ref 4.22–5.81)
RBC: 5.17 MIL/uL (ref 4.22–5.81)
RDW: 15 % (ref 11.5–15.5)
RDW: 15.2 % (ref 11.5–15.5)
WBC: 12.7 10*3/uL — AB (ref 4.0–10.5)
WBC: 16.9 10*3/uL — AB (ref 4.0–10.5)

## 2015-05-03 LAB — BLOOD GAS, ARTERIAL
Acid-base deficit: 6.1 mmol/L — ABNORMAL HIGH (ref 0.0–2.0)
Bicarbonate: 16.5 mEq/L — ABNORMAL LOW (ref 20.0–24.0)
DRAWN BY: 422461
FIO2: 0.5
MECHVT: 660 mL
O2 Saturation: 97.2 %
PEEP/CPAP: 5 cmH2O
PO2 ART: 94.6 mmHg (ref 80.0–100.0)
Patient temperature: 98.6
RATE: 26 resp/min
TCO2: 14.1 mmol/L (ref 0–100)
pCO2 arterial: 26.4 mmHg — ABNORMAL LOW (ref 35.0–45.0)
pH, Arterial: 7.411 (ref 7.350–7.450)

## 2015-05-03 MED ORDER — MORPHINE BOLUS VIA INFUSION
5.0000 mg | INTRAVENOUS | Status: DC | PRN
  Administered 2015-05-03 (×3): 5 mg via INTRAVENOUS
  Filled 2015-05-03 (×4): qty 20

## 2015-05-03 MED ORDER — ANTISEPTIC ORAL RINSE SOLUTION (CORINZ)
7.0000 mL | Freq: Four times a day (QID) | OROMUCOSAL | Status: DC
  Administered 2015-05-03: 7 mL via OROMUCOSAL

## 2015-05-03 MED ORDER — MORPHINE SULFATE 25 MG/ML IV SOLN
10.0000 mg/h | INTRAVENOUS | Status: DC
  Administered 2015-05-03: 5 mg/h via INTRAVENOUS
  Filled 2015-05-03: qty 10

## 2015-05-03 MED ORDER — CHLORHEXIDINE GLUCONATE 0.12% ORAL RINSE (MEDLINE KIT)
15.0000 mL | Freq: Two times a day (BID) | OROMUCOSAL | Status: DC
  Administered 2015-05-03: 15 mL via OROMUCOSAL

## 2015-05-04 LAB — MRSA CULTURE

## 2015-05-06 ENCOUNTER — Telehealth: Payer: Self-pay

## 2015-05-06 LAB — TYPE AND SCREEN
ABO/RH(D): O POS
Antibody Screen: NEGATIVE
UNIT DIVISION: 0
Unit division: 0
Unit division: 0
Unit division: 0

## 2015-05-06 NOTE — Telephone Encounter (Signed)
On 05/06/2015 I received a death certificate from [facility]. The death certificate is for cremation. The patient is a patient of Doctor Nelda Marseille. The death certificate will be taken to [facility] this pm for signature so that Nelda Marseille can sign the death certificate this pm when he comes in. On 2015/06/01 I received the death certificate back from Doctor Nelda Marseille. I got the death certificate ready for pickup and called the funeral home to let them know the death certificate is ready for pickup.

## 2015-05-07 LAB — CULTURE, BLOOD (ROUTINE X 2)
CULTURE: NO GROWTH
Culture: NO GROWTH

## 2015-05-15 ENCOUNTER — Ambulatory Visit: Payer: Self-pay | Admitting: Nurse Practitioner

## 2015-05-27 NOTE — Progress Notes (Signed)
PULMONARY / CRITICAL CARE MEDICINE   Name: Jesse [name] MRN: [id number] DOB: 30-Jan-1941    ADMISSION DATE:  05/07/2015 CONSULTATION DATE:  10/7  REFERRING MD :  EDP Mackuen   CHIEF COMPLAINT:  Shock   INITIAL PRESENTATION:  74 year old demented male w/ recent h/o total colectomy on 9/3 c/b prolonged course which included: delirium, hemorrhagic shock, aspiration PNA, and post-op ileostomy bleeding. Was eventually discharged to SNF. Presents to the [facility] ER on 10/7 w/ wife reporting decreased LOC since d/c to home on 10/6. On arrival to ER he had agonal respirations, BP in 40s and what appeared to be frank blood from ostomy. He was intubated, CVL placed and resuscitation efforts initiated. PCCM asked to admit.   STUDIES:    SIGNIFICANT EVENTS: 10/7: spoke to wife. Verified DNR. No escalation of care.   HISTORY OF PRESENT ILLNESS:   See above.   SUBJECTIVE:  Sedated on vent   VITAL SIGNS: Temp:  [94.1 F (34.5 C)-98.6 F (37 C)] 98.3 F (36.8 C) (10/08 0437) Pulse Rate:  [29-136] 107 (10/08 1130) Resp:  [13-38] 26 (10/08 1130) BP: (38-161)/(11-113) 98/68 mmHg (10/08 1130) SpO2:  [88 %-100 %] 98 % (10/08 1130) FiO2 (%):  [50 %-100 %] 50 % (10/08 0800) Weight:  [86 kg (189 lb 9.5 oz)] 86 kg (189 lb 9.5 oz) (10/08 0700)  HEMODYNAMICS: CVP:  [8 mmHg-13 mmHg] 8 mmHg  VENTILATOR SETTINGS: Vent Mode:  [-] PRVC FiO2 (%):  [50 %-100 %] 50 % Set Rate:  [20 bmp-26 bmp] 26 bmp Vt Set:  [244 mL] 660 mL PEEP:  [5 cmH20] 5 cmH20 Plateau Pressure:  [12 cmH20-26 cmH20] 17 cmH20 INTAKE / OUTPUT:  Intake/Output Summary (Last 24 hours) at 31-May-2015 1231 Last data filed at 05/31/15 1137  Gross per 24 hour  Intake 4984.92 ml  Output   2890 ml  Net 2094.92 ml   PHYSICAL EXAMINATION: General:  Chronically ill appearing male, now acutely ill and unresponsive on vent  Neuro:  GCS4, moves to noxious stim only  HEENT:  Mucous membranes are dry, neck veins are flat. Orally intubated   Cardiovascular:  rrr Lungs:  Scattered rhonchi.  Abdomen:  Soft, ostomy w/ what appears to be frank blood.  Musculoskeletal:  Deconditioned.  Skin:  Intact, dry   LABS:  CBC  Recent Labs Lab 04/26/2015 1805 2015-05-31 0025 05/31/2015 0530  WBC 17.7* 12.7* 16.9*  HGB 15.0 15.5 15.5  HCT 45.8 46.1 44.7  PLT 263 271 235   Coag's  Recent Labs Lab 05/04/2015 1125  INR 1.22   BMET  Recent Labs Lab 05/09/2015 1125 05/10/2015 1805 31-May-2015 0530  NA 129* 134* 138  K 5.7* 6.1* 4.5  CL 97* 109 100*  CO2 7* 12* 20*  BUN 110* 110* 103*  CREATININE 10.62* 9.37* 8.88*  GLUCOSE 215* 127* 154*   Electrolytes  Recent Labs Lab 05/20/2015 1125 05/22/2015 1805 05-31-15 0530  CALCIUM 11.1* 8.5* 8.6*   Sepsis Markers  Recent Labs Lab 05/26/2015 1128  LATICACIDVEN 8.27*   ABG  Recent Labs Lab 05/13/2015 1302 05-31-15 0424  PHART 7.027* 7.411  PCO2ART 26.9* 26.4*  PO2ART 301* 94.6   Liver Enzymes  Recent Labs Lab 05/09/2015 1125 05/16/2015 1805 May 31, 2015 0530  AST 42* 43* 47*  ALT 52 43 46  ALKPHOS 95 64 63  BILITOT 0.8 1.3* 1.6*  ALBUMIN 4.0 2.5* 2.5*   Cardiac Enzymes  Recent Labs Lab 05/06/2015 1125  TROPONINI 0.03   Glucose  Recent Labs  Lab 05/19/2015 1556 04/29/2015 2004 05/08/2015 2349 05-22-15 0416 05/22/15 0803  GLUCAP 123* 150* 215* 155* 168*   Imaging Dg Chest Portable 1 View  05/16/2015   CLINICAL DATA:  Central line placement, hypertension, diabetes mellitus, prostate cancer  EXAM: PORTABLE CHEST 1 VIEW  COMPARISON:  Portable exam 1245 hours compared to 1140 hours  FINDINGS: Tip of endotracheal tube projects 6.8 cm above carina.  Nasogastric tube extends to least Celsius inferior mediastinum beyond extent of exam.  New LEFT jugular central venous catheter with tip projecting over LEFT brachiocephalic vein near SVC confluence.  Normal heart size, mediastinal contours and pulmonary vascularity.  Exclusion of LEFT lung base and RIGHT costophrenic angle.  RIGHT  lower lobe infiltrate questioned.  Remaining lungs clear.  No pneumothorax.  IMPRESSION: No pneumothorax following LEFT jugular line placement.  Question developing RIGHT lower lobe infiltrate/pneumonia.   Electronically Signed   By: Lavonia Dana M.D.   On: 04/27/2015 12:51   ASSESSMENT / PLAN:  PULMONARY OETT 10/7>>> A: Acute respiratory failure  P:   Continue full vent support D/C further blood draws. PAD protocol.  CARDIOVASCULAR CVL left IJ CVL 10/7>> A:  Hemorrhagic shock c/b cardiogenic component driven by acidosis   P:  D/C further tansfusion. D/C pressors. Full DNR  RENAL A:   AKI Severe lactic acidosis  Hyperkalemia  P:   D/C bicarb gtt D/C further blood draws.  GASTROINTESTINAL A:   Acute LGIB. Had recent total colectomy d/t diverticular disease and massive bleeding in September  Probable ischemic bowel P:   NPO GI called. D/C PPI  HEMATOLOGIC A:   Acute blood loss anemia  P:  D/C further transfusion and blood draws. PAS   INFECTIOUS A:   Possible ischemic gut  P:   BCx2 10/7>>>10/8 Zosyn 10/7>>>10/8 D/C abx.  ENDOCRINE A:   DM P:   D/C CBGs.  NEUROLOGIC A:   Dementia  Acute encephalopathy  P:   Morphine for comfort.  FAMILY  - Updates: wife at bedside updated, confirmed full DNR.  Will stop pressors and allow patient to pass comfortably with morphine on board.   The patient is critically ill with multiple organ systems failure and requires high complexity decision making for assessment and support, frequent evaluation and titration of therapies, application of advanced monitoring technologies and extensive interpretation of multiple databases.   Critical Care Time devoted to patient care services described in this note is  35  Minutes. This time reflects time of care of this signee Dr [name]. This critical care time does not reflect procedure time, or teaching time or supervisory time of PA/NP/Med student/Med Resident etc but  could involve care discussion time.  Rush Farmer, M.D. [facility] Pulmonary/Critical Care Medicine. Pager: [phone]. After hours pager: [phone].  05/22/15, 12:31 PM

## 2015-05-27 NOTE — Progress Notes (Signed)
Pt's wife Cyndee Brightly) was bedside when I arrived. She asked for prayer prior to extubation and played a selection of love melodies placing the phone near his head. She sang along with the music to him as he was extubated. Cyndee Brightly was appropriately tearful and during our visit shared their love story.  She shared other family dynamics as I listened empathically and offered encouragement and support as needed. She was very grateful for support given. Ernest Haber Chaplain

## 2015-05-27 NOTE — Progress Notes (Signed)
Called back to Mr. Aubuchon room shortly after he passed; his wife Cyndee Brightly was still bedside as well as her sister. She wanted to know funeral home information, and what to do next. Since she is new to [facility], she was not familiar w/local services. Provided her a list of funeral companies in addition to the number to call [place] (bed control)  when she has made her decision as to which funeral home she will use. She asked for prayer again and was very grateful for earlier visits and assistance. Chaplain Marlise Eves Holder   05-09-2015 2000  Clinical Encounter Type  Visited With Family

## 2015-05-27 NOTE — Progress Notes (Signed)
40 mL of 10 mcg/mL 250 mL bag of fentanyl wasted in sink. Witnessed by Jeannie Fend, RN.

## 2015-05-27 NOTE — Discharge Summary (Addendum)
Jesse [name], Jesse [name]                ACCOUNT NO.:  [id number]  MEDICAL RECORD NO.:  [id number]  LOCATION:  3383                         FACILITY:  [facility]  PHYSICIAN:  Providence Lanius, MD  DATE OF BIRTH:  [birth year]  DATE OF ADMISSION:  05/19/2015 DATE OF DISCHARGE:  09-May-2015                              DISCHARGE SUMMARY   DEATH SUMMARY  PRIMARY DIAGNOSIS/CAUSE OF DEATH:  Postoperative hemorrhagic shock with cardiogenic component.  SECONDARY DIAGNOSES: 1. Aspiration pneumonia. 2. Hemorrhagic shock. 3. Acute respiratory failure. 4. Ileus. 5. Metabolic acidosis. 6. Acute kidney injury. 7. Hyperkalemia. 8. Acute lower gastrointestinal bleed, likely ischemic bowel. 9. Acute encephalopathy. 10. Hyponatremia - resolved.  HOSPITAL COURSE:  The patient is a 74 year old male with a past medical history significant for mild dementia who presents to the hospital after recent total colectomy on September 3rd with multiple complications.  In the ER, he was found to be agonal, systolic blood pressure in the 40s and was intubated.  Central line was placed and resuscitative efforts began.  The patient failed to respond appropriately and Dr. Lamonte Sakai had a conversation with the family who informed us that they wanted him to be limited code with no CPR, no cardioversion.  On the following day, the patient continued to do poorly, refractory shock at that point with significant acidosis.  I had a conversation with the patient's wife who after discussion told the patient would not want to be maintained in such condition at which point decision was made after confirming full DNR to stop pressors and extubate the patient after giving morphine and to allow to pass comfortably with family at the bedside.     Providence Lanius, MD     WJY/MEDQ  D:  05/13/2015  T:  05/13/2015  Job:  [id number]

## 2015-05-27 NOTE — Procedures (Signed)
Extubation Procedure Note  Patient Details:   Name: Jesse [name] DOB: 13-Jan-1941 MRN: [id number]   Airway Documentation:     Evaluation  O2 sats: stable throughout Complications: No apparent complications Patient did tolerate procedure well. Bilateral Breath Sounds: Clear, Diminished Suctioning: Airway No, Palliative Pt., Wife at bedside, decided to have Husband Extubated, Dr. Nelda Marseille made aware, no complications, RN aware, placed on 4 lpm n/c.  Winferd Humphrey May 21, 2015, 2:07 PM

## 2015-05-27 NOTE — Progress Notes (Signed)
Pts times of death 1934 on 21-May-2015. Heart and Lung sounds auscultated for a full minute with none to be heard with Barnabas Lister, RN. Wife at bedside at time of death.

## 2015-05-27 NOTE — Progress Notes (Signed)
Chaplin called per family request and E-link made aware.

## 2015-05-27 DEATH — deceased

## 2015-06-09 NOTE — Progress Notes (Signed)
Patient ID: Jesse [name], male   DOB: [birth year], 74 y.o.   MRN: [id number]    Facility: [facility]      No Known Allergies  Chief Complaint  Patient presents with  . Discharge Note    HPI:  He is being discharged to home with home health for pt/ot/st/rn he will need a rolling walker. He will need his prescriptions written and will need to follow up with his pcp. He is unable to fully participate in the hpi or ros.    Past Medical History  Diagnosis Date  . Memory loss   . Hypertension   . Diabetes mellitus without complication ([place])   . Diverticulosis     Of colon  . Hepatitis C     Virus has never been treated. No history of cirrhosis.  . Prostate cancer ([place]) ~ 1990s.    Treated with radiation seed implant.  . Hiatal hernia     Does not require meds for reflux symptoms.    Past Surgical History  Procedure Laterality Date  . Laparoscopic cholecystectomy  ~ 2003  . Insertion prostate radiation seed  1990s  . Esophagogastroduodenoscopy N/A 03/29/2015    Procedure: ESOPHAGOGASTRODUODENOSCOPY (EGD);  Surgeon: Jerene Bears, MD;  Location: [facility] ENDOSCOPY;  Service: Endoscopy;  Laterality: N/A;  . Colectomy N/A 03/29/2015    Procedure: EXPLORATORY LAP WITH TOTAL COLECTOMY;  Surgeon: Ralene Ok, MD;  Location: [place];  Service: General;  Laterality: N/A;  . Cystoscopy N/A 03/29/2015    Procedure: CYSTOSCOPY WITH PLACEMENT OF 26 FRENCH COUDE CATHETER.;  Surgeon: Ralene Ok, MD;  Location: [place];  Service: General;  Laterality: N/A;    VITAL SIGNS BP 132/80 mmHg  Pulse 107  Ht 5\' 10"  (1.778 m)  Wt 191 lb (86.637 kg)  BMI 27.41 kg/m2  SpO2 98%  Patient's Medications  New Prescriptions   No medications on file  Previous Medications   FEEDING SUPPLEMENT, ENSURE ENLIVE, (ENSURE ENLIVE) LIQD    Take 237 mLs by mouth 2 (two) times daily between meals.   LISINOPRIL (PRINIVIL,ZESTRIL) 5 MG TABLET    Take 1 tablet (5 mg total) by mouth daily.   QUETIAPINE (SEROQUEL) 50 MG TABLET     Take 1 tablet (50 mg total) by mouth at bedtime.   TRAZODONE (DESYREL) 50 MG TABLET    Take 0.5 tablets (25 mg total) by mouth at bedtime.  Modified Medications   No medications on file  Discontinued Medications   No medications on file     SIGNIFICANT DIAGNOSTIC EXAMS   Review of Systems  Unable to perform ROS: dementia     Physical Exam  Constitutional: No distress.  Eyes: Conjunctivae are normal.  Neck: Neck supple. No JVD present. No thyromegaly present.  Cardiovascular: Normal rate, regular rhythm and intact distal pulses.   Respiratory: Effort normal and breath sounds normal. No respiratory distress. He has no wheezes.  GI: Soft. Bowel sounds are normal. He exhibits no distension. There is no tenderness.  Has colostomy   Musculoskeletal: He exhibits no edema.  Able to move all extremities   Lymphadenopathy:    He has no cervical adenopathy.  Neurological: He is alert.  Skin: Skin is warm and dry. He is not diaphoretic.  Incision line without signs of infection   Psychiatric: He has a normal mood and affect.     ASSESSMENT/ PLAN:   Will discharge to home with home health for pt/ot/rn/st to evaluate and treat as indicated for bathing; cognition; adl training;  gait strength; balance; medication and disease management. He will need a front wheel rolling walker in order to maintain his current level of independence with his adl's. His prescriptions have been written for a 30 day supply of his medications. Will follow up with Sherrie Mustache NP on 05-15-15 at 1:30 pm   Time spent with patient  45  minutes >50% time spent counseling; reviewing medical record; tests; labs; and developing future plan of care       Ok Edwards NP [facility] Adult Medicine  Contact [phone] Monday through Friday 8am- 5pm  After hours call [phone]

## 2016-12-02 IMAGING — CR DG CHEST 1V PORT
1 series · 1 of 1 positions shown · non-contrast
Comparison: Chest x-ray a 03/29/2015.

CLINICAL DATA: 74-year-old male status post central line placement.

EXAM:
PORTABLE CHEST - 1 VIEW

[AP]
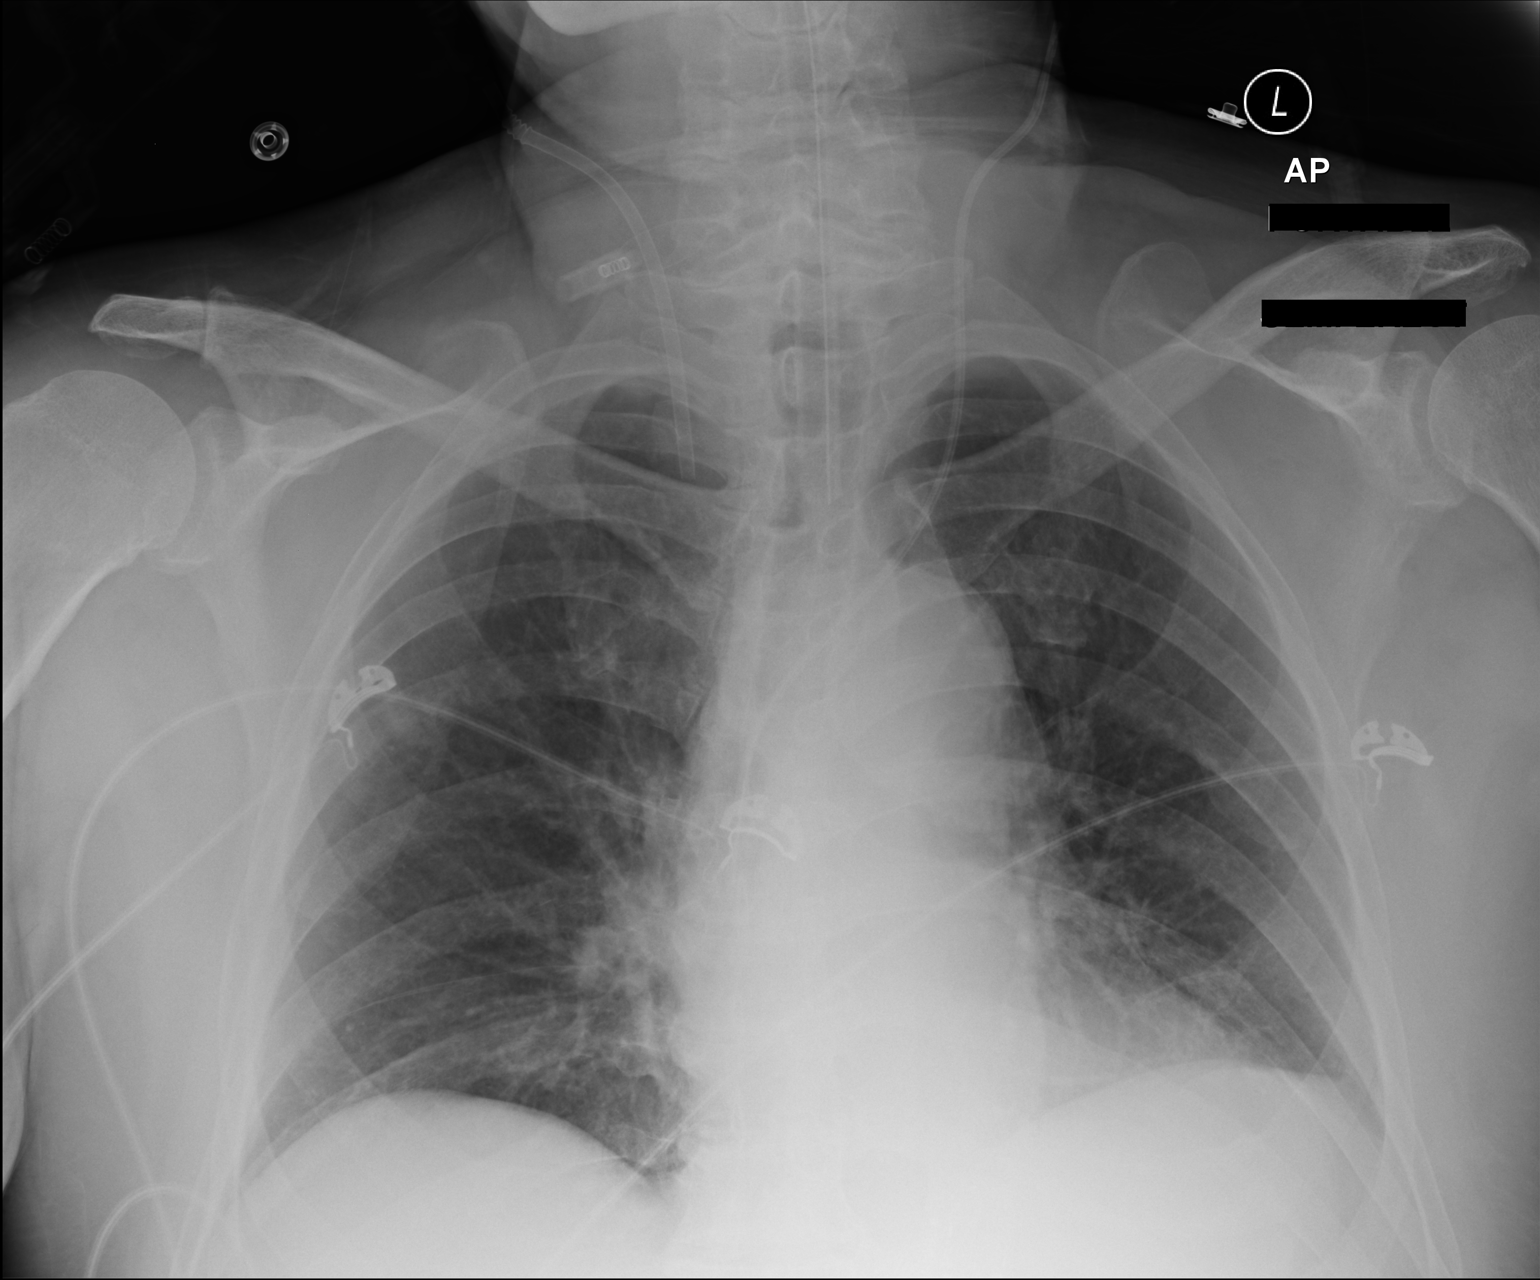

[1 of 1 positions shown; findings below may reference images not displayed]

FINDINGS: New left internal jugular central venous catheter with tip
terminating at the superior cavoatrial junction. Right IJ central
venous Cordis with tip in the internal jugular vein. An endotracheal
tube is in place with tip 7.1 cm above the carina. Lung volumes are
low. Bibasilar opacities (left greater than right), most compatible
subsegmental atelectasis. No consolidative airspace disease. No
pneumothorax. No pleural effusions. No evidence of pulmonary edema.
Heart size and mediastinal contours are within normal limits.
IMPRESSION: 1. Support apparatus, as above. New left internal jugular central
venous catheter tip terminates at the superior cavoatrial junction.
2. No pneumothorax or other complicating features.
3. Low lung volumes with bibasilar subsegmental atelectasis.

## 2016-12-04 IMAGING — CR DG CHEST 1V PORT
1 series · 1 of 1 positions shown · non-contrast
Comparison: 03/30/2015 and earlier.

CLINICAL DATA: 74-year-old male with acute GI bleeding, intubated.

EXAM:
PORTABLE CHEST - 1 VIEW

[AP]
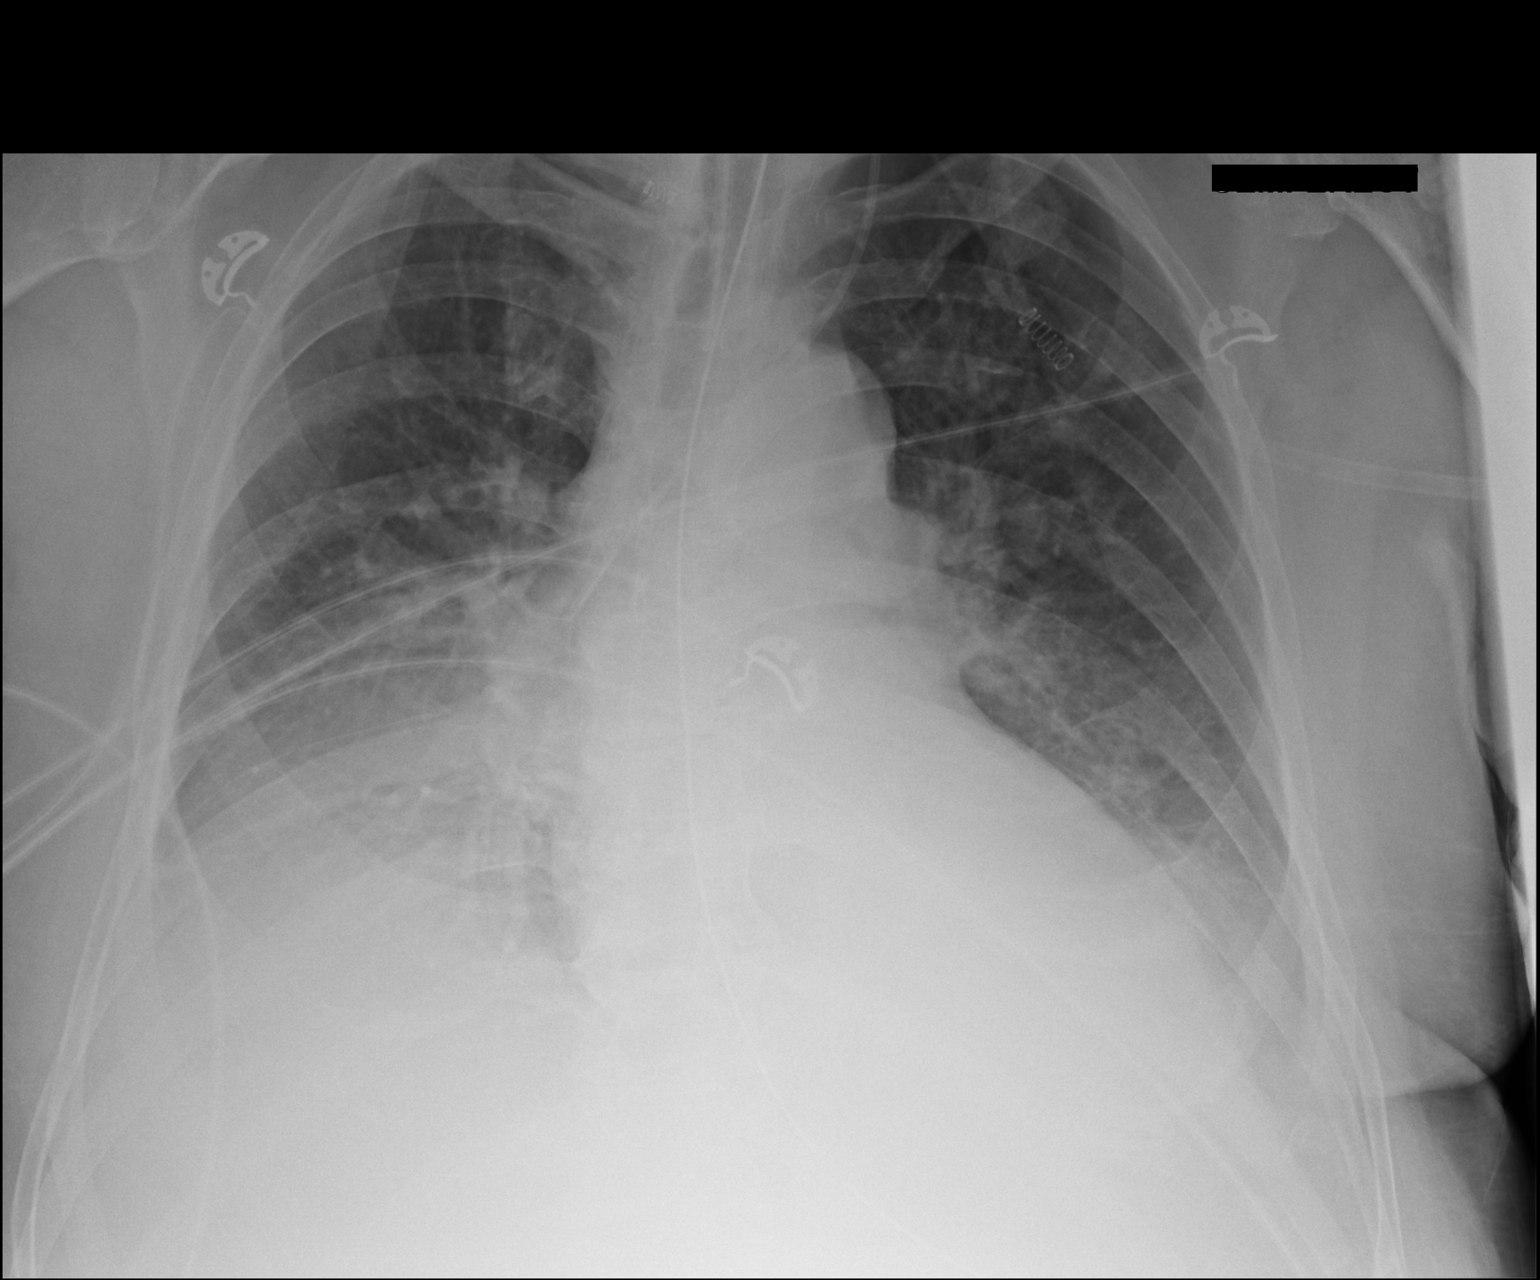

[1 of 1 positions shown; findings below may reference images not displayed]

FINDINGS: Portable AP semi upright view at 1626 hours. Stable endotracheal
tube tip at the level the clavicles. Enteric tube courses to the
left upper quadrant, tip not included. Stable left IJ central line.
Increased veiling opacity right greater than left with increased
lower lobe opacity and obscuration of the diaphragm. Stable cardiac
size and mediastinal contours. Upper lobes are stable. No
pneumothorax or pulmonary edema.
IMPRESSION: 1.  Stable lines and tubes.
2. Progressed bilateral pleural effusions and lower lobe collapse or
consolidation.

## 2016-12-06 IMAGING — CR DG CHEST 1V PORT
1 series · 1 of 1 positions shown · non-contrast
Comparison: April 01, 2015

CLINICAL DATA: Hypoxia

EXAM:
PORTABLE CHEST - 1 VIEW

[AP]
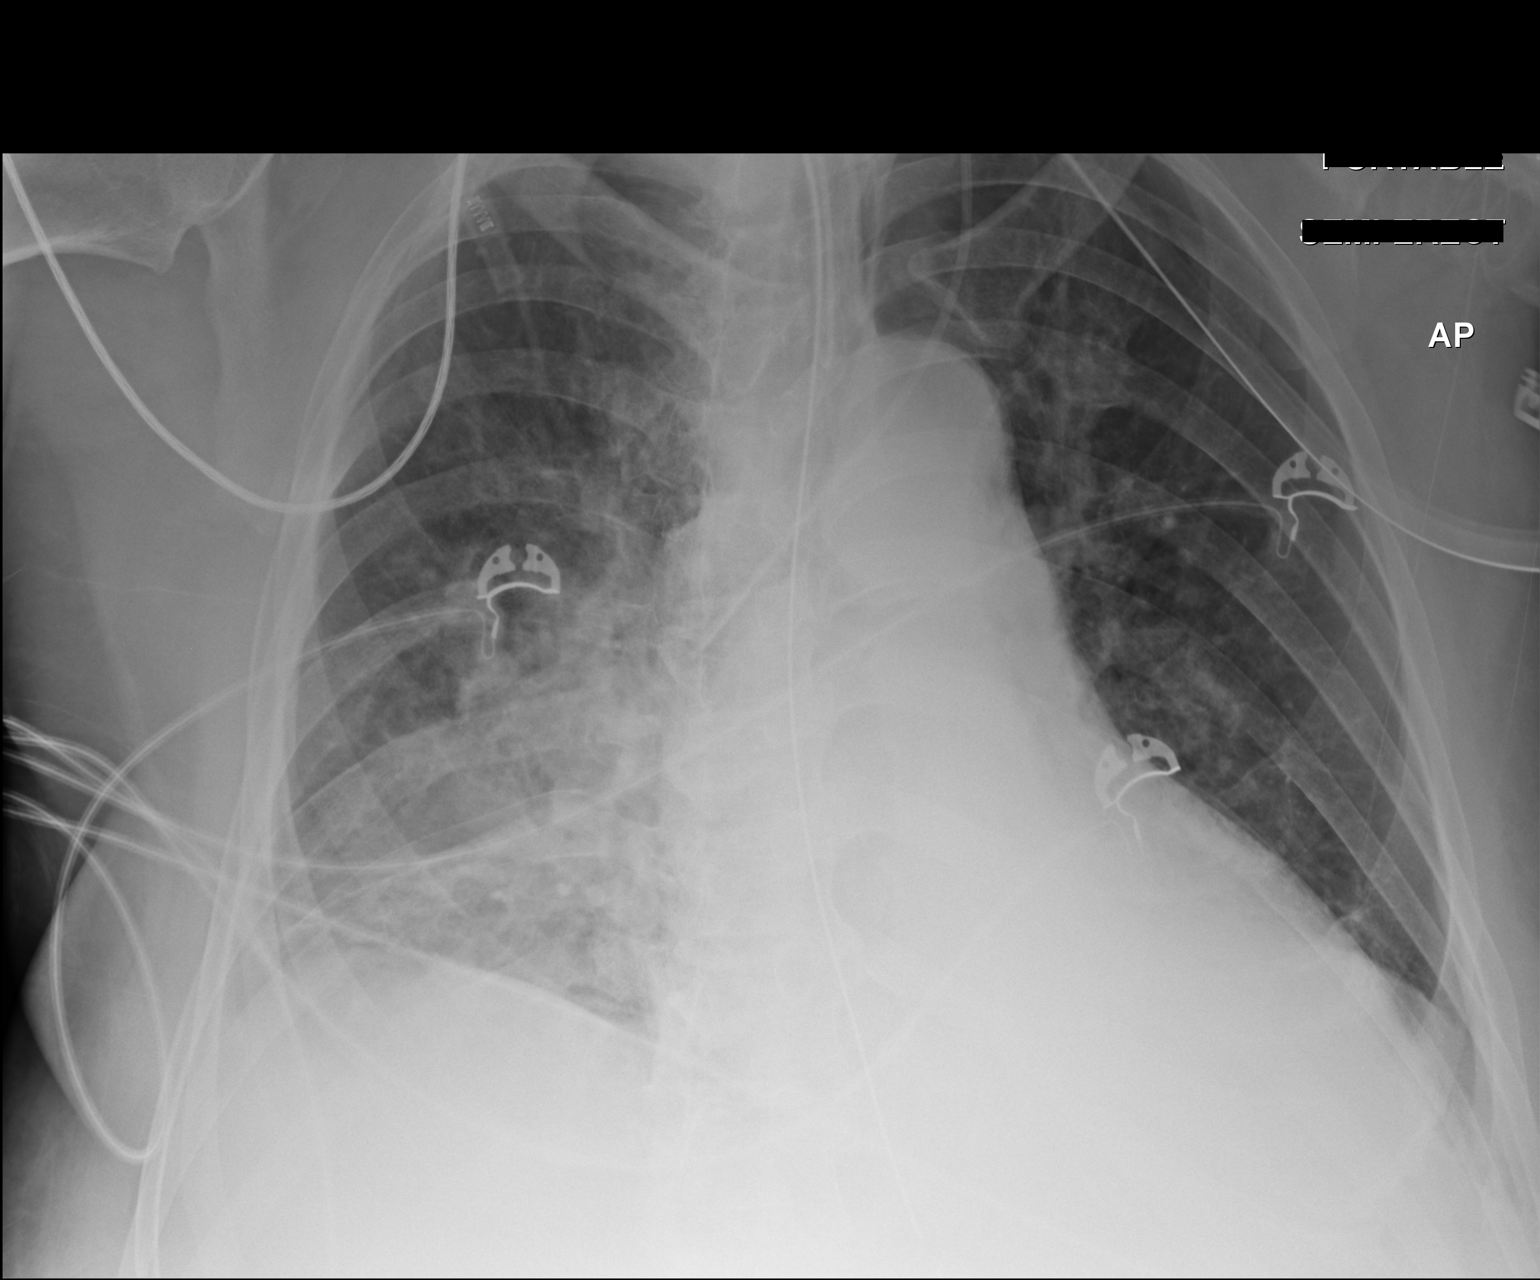

[1 of 1 positions shown; findings below may reference images not displayed]

FINDINGS: Endotracheal tube tip is 4.7 cm above the carina. Central catheter
tip is in the superior vena cava just beyond the junction with the
right atrium. Nasogastric tube tip is at the gastroesophageal
junction with the side port slightly above the gastroesophageal
junction. No pneumothorax. There is patchy infiltrate in both lung
bases with left lower lobe consolidation. Infiltrate is more patchy
on the right. Heart is upper normal in size with pulmonary
vascularity within normal limits. No adenopathy.
IMPRESSION: Airspace disease in both lower lobes with consolidation in the left
base and more patchy infiltrate in the right base. These changes are
stable. No change in cardiac silhouette. Tube and catheter positions
are unchanged without pneumothorax. Note that the nasogastric tube
side port is above the gastroesophageal junction. It may be prudent
to advance nasogastric tube 8-10 cm to insure that both nasogastric
tube tip and side port are well within the stomach.

## 2016-12-06 IMAGING — CR DG ABD PORTABLE 1V
1 series · 1 of 1 positions shown · non-contrast
Comparison: None.

CLINICAL DATA: NG tube placement

EXAM:
PORTABLE ABDOMEN - 1 VIEW

[AP]
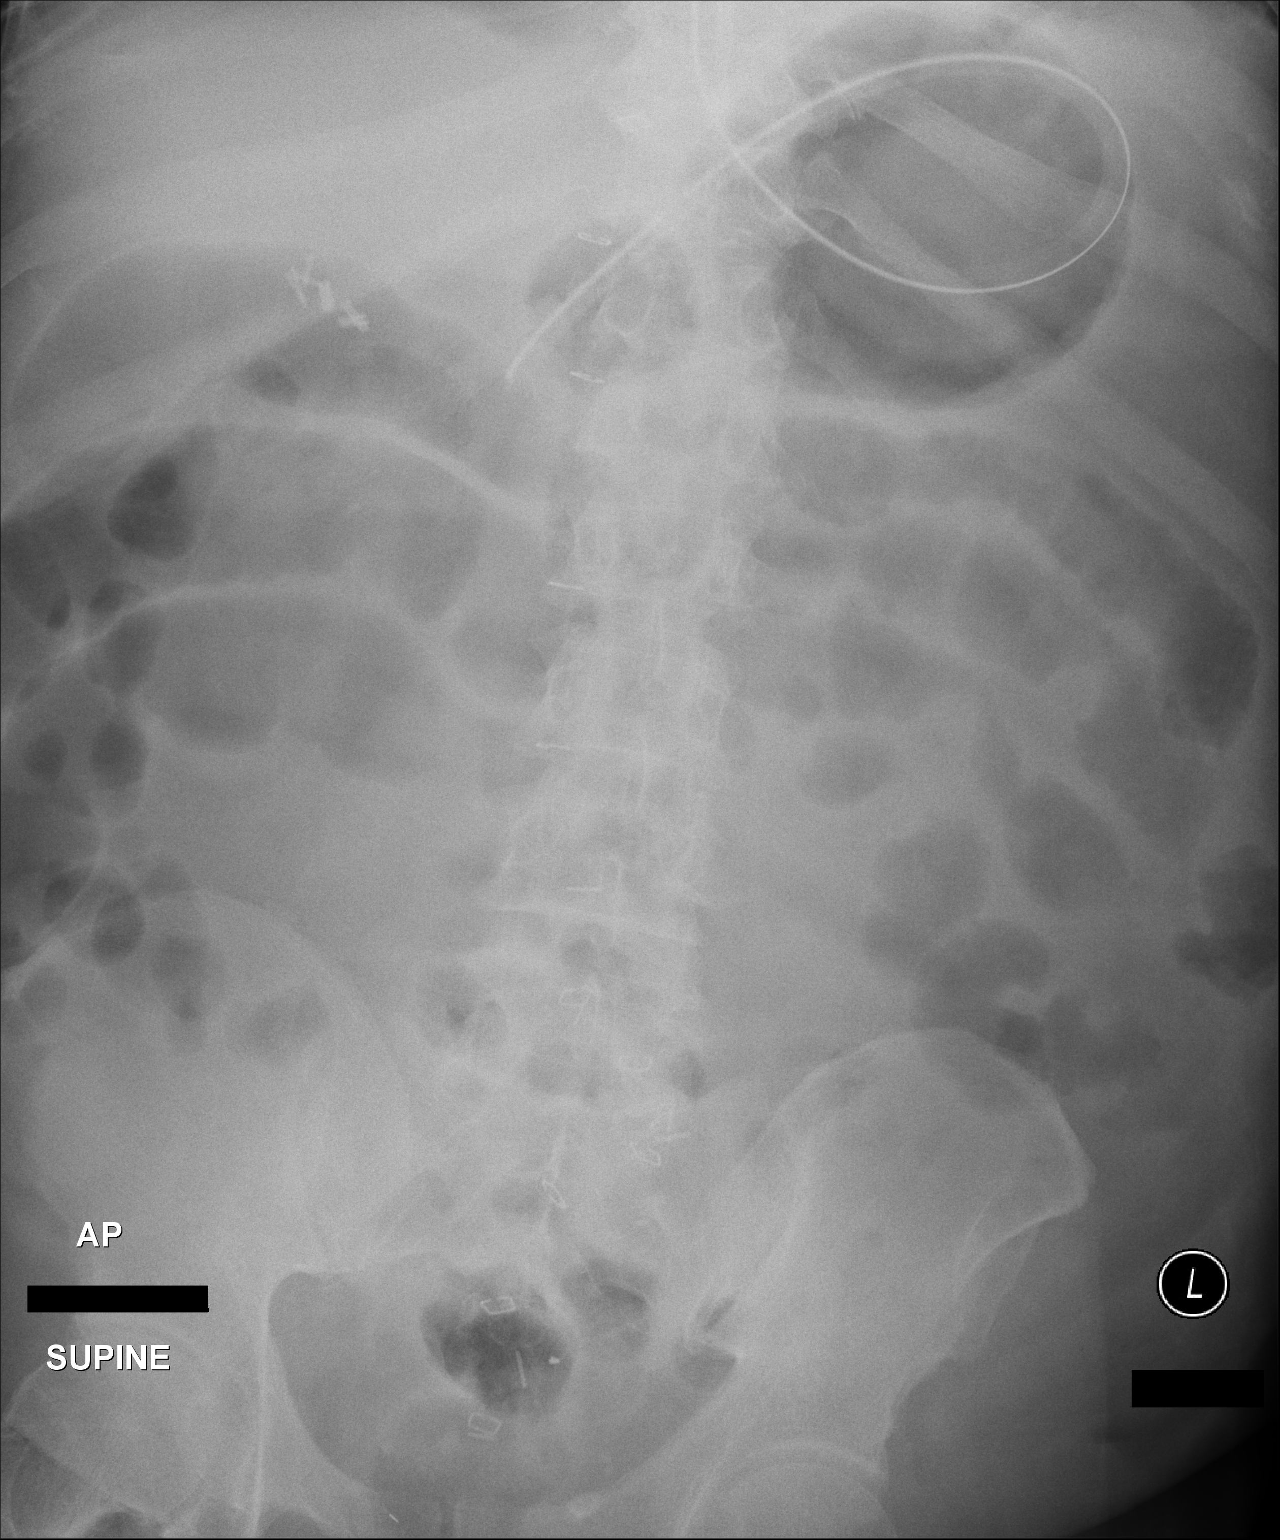

[1 of 1 positions shown; findings below may reference images not displayed]

FINDINGS: NG tube projects over the stomach. Numerous mildly distended loops
of small bowel. Gas is seen into the rectum.
IMPRESSION: NG tube over the stomach.

## 2016-12-07 IMAGING — CR DG ABD PORTABLE 1V
1 series · 1 of 1 positions shown · non-contrast
Comparison: 04/02/2015.

CLINICAL DATA: 74-year-old male status post enteric tube placement.
Initial encounter. Recent exploratory laparotomy with total
colectomy for lower GI bleeding.

EXAM:
PORTABLE ABDOMEN - 1 VIEW

[AP]
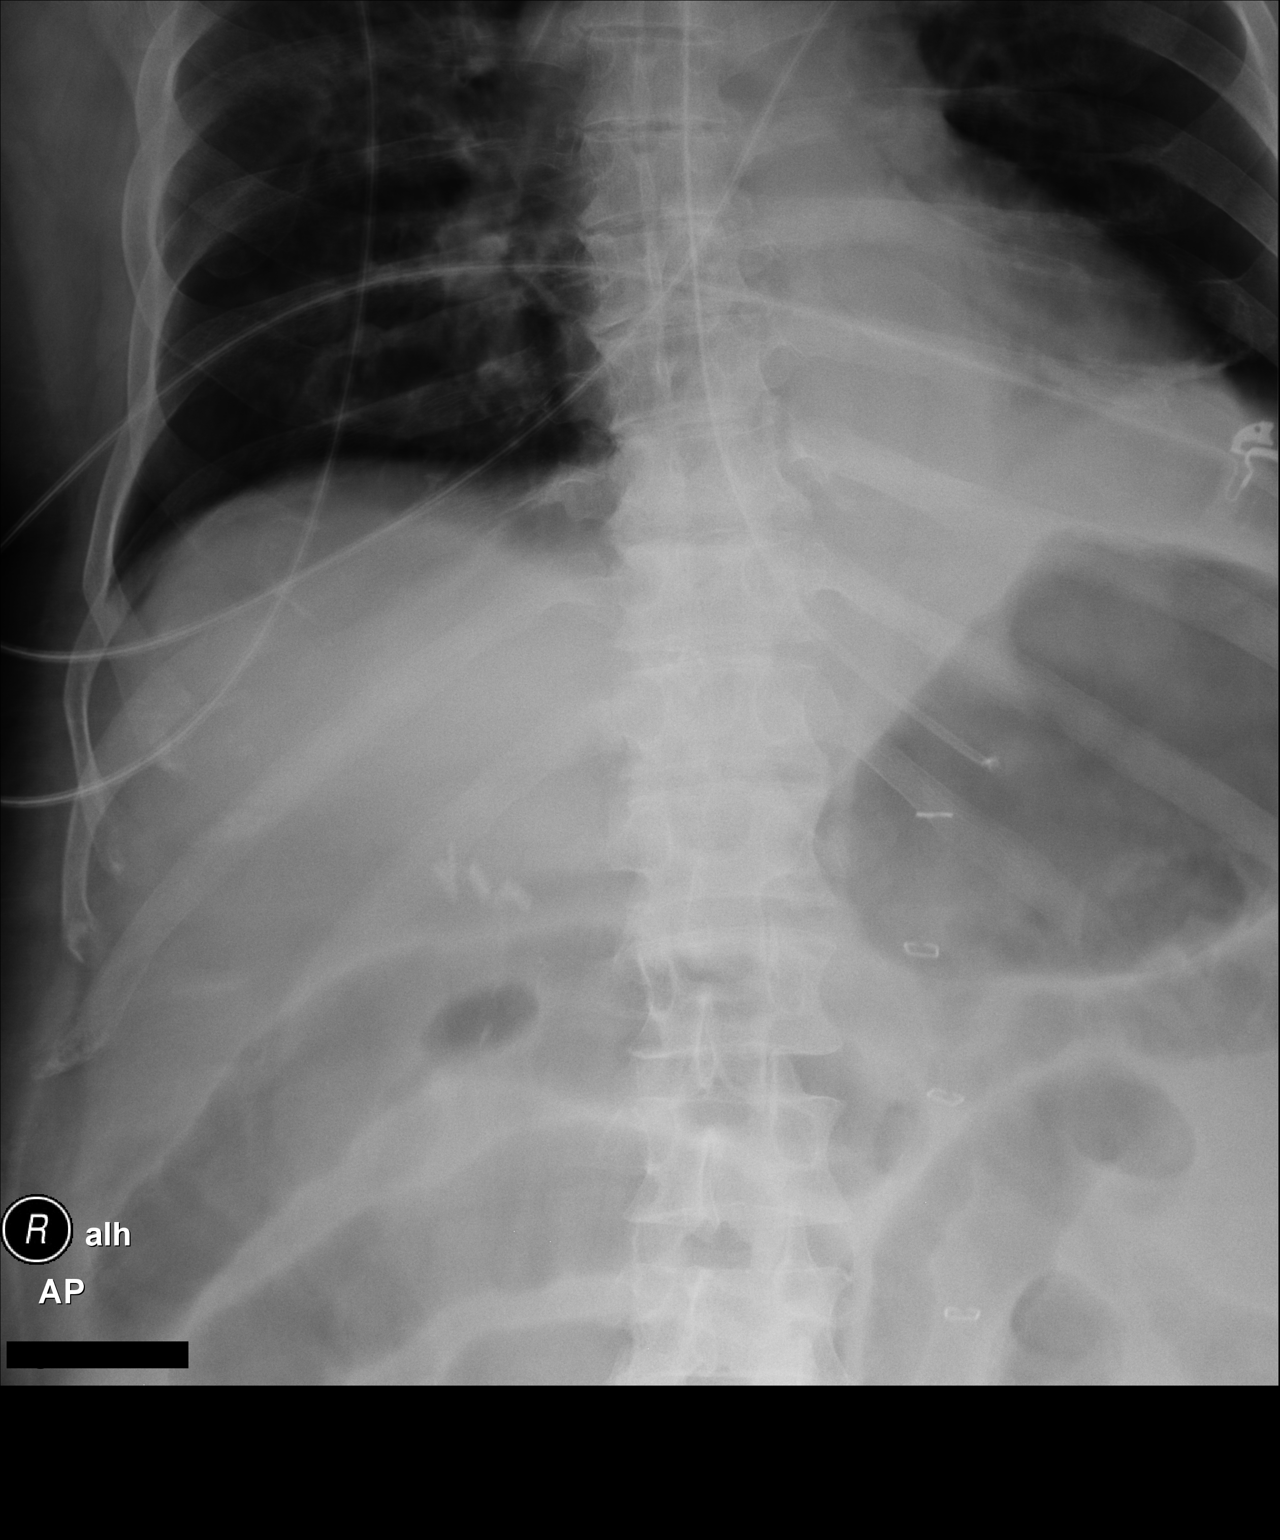

[1 of 1 positions shown; findings below may reference images not displayed]

FINDINGS: Portable AP supine view at 0924 hours. Enteric tube courses to the
left upper quadrant. The tip projects over the the proximal gastric
air. The side hole is not identified.

Mild motion artifact. Midline abdominal skin staples and right upper
quadrant surgical clips again noted. Stable gas pattern with
multiple gas-filled small bowel loops in the mid abdomen. Increased
density at the left lung base. See chest radiograph from today
reported separately.
IMPRESSION: 1. Enteric tube tip terminates in the stomach.
2. Multiple gas-filled small bowel loops in the upper abdomen may
indicate ileus.
